# Patient Record
Sex: Male | Born: 1935 | Race: White | Hispanic: No | Marital: Married | Smoking: Former smoker
Health system: Southern US, Community
[De-identification: ages and names within clinical notes are randomized; demographics above are authoritative.]

## PROBLEM LIST (undated history)

## (undated) DIAGNOSIS — M353 Polymyalgia rheumatica: Secondary | ICD-10-CM

## (undated) DIAGNOSIS — I2510 Atherosclerotic heart disease of native coronary artery without angina pectoris: Secondary | ICD-10-CM

## (undated) DIAGNOSIS — J069 Acute upper respiratory infection, unspecified: Secondary | ICD-10-CM

## (undated) DIAGNOSIS — E538 Deficiency of other specified B group vitamins: Secondary | ICD-10-CM

## (undated) DIAGNOSIS — N189 Chronic kidney disease, unspecified: Secondary | ICD-10-CM

## (undated) DIAGNOSIS — M792 Neuralgia and neuritis, unspecified: Secondary | ICD-10-CM

## (undated) DIAGNOSIS — IMO0002 Reserved for concepts with insufficient information to code with codable children: Secondary | ICD-10-CM

## (undated) DIAGNOSIS — Z9181 History of falling: Secondary | ICD-10-CM

## (undated) DIAGNOSIS — R011 Cardiac murmur, unspecified: Secondary | ICD-10-CM

## (undated) DIAGNOSIS — I499 Cardiac arrhythmia, unspecified: Secondary | ICD-10-CM

## (undated) DIAGNOSIS — Z8673 Personal history of transient ischemic attack (TIA), and cerebral infarction without residual deficits: Secondary | ICD-10-CM

## (undated) DIAGNOSIS — R55 Syncope and collapse: Secondary | ICD-10-CM

## (undated) DIAGNOSIS — K219 Gastro-esophageal reflux disease without esophagitis: Secondary | ICD-10-CM

## (undated) DIAGNOSIS — D649 Anemia, unspecified: Secondary | ICD-10-CM

## (undated) DIAGNOSIS — I2089 Other forms of angina pectoris: Secondary | ICD-10-CM

## (undated) DIAGNOSIS — I6529 Occlusion and stenosis of unspecified carotid artery: Secondary | ICD-10-CM

## (undated) DIAGNOSIS — R0989 Other specified symptoms and signs involving the circulatory and respiratory systems: Secondary | ICD-10-CM

## (undated) DIAGNOSIS — I208 Other forms of angina pectoris: Secondary | ICD-10-CM

## (undated) DIAGNOSIS — M545 Low back pain, unspecified: Secondary | ICD-10-CM

## (undated) DIAGNOSIS — N400 Enlarged prostate without lower urinary tract symptoms: Secondary | ICD-10-CM

## (undated) DIAGNOSIS — J309 Allergic rhinitis, unspecified: Secondary | ICD-10-CM

## (undated) DIAGNOSIS — I509 Heart failure, unspecified: Secondary | ICD-10-CM

## (undated) DIAGNOSIS — Z8719 Personal history of other diseases of the digestive system: Secondary | ICD-10-CM

## (undated) DIAGNOSIS — R609 Edema, unspecified: Secondary | ICD-10-CM

## (undated) DIAGNOSIS — N200 Calculus of kidney: Secondary | ICD-10-CM

## (undated) DIAGNOSIS — I209 Angina pectoris, unspecified: Secondary | ICD-10-CM

## (undated) DIAGNOSIS — I10 Essential (primary) hypertension: Secondary | ICD-10-CM

## (undated) DIAGNOSIS — M1990 Unspecified osteoarthritis, unspecified site: Secondary | ICD-10-CM

## (undated) DIAGNOSIS — N529 Male erectile dysfunction, unspecified: Secondary | ICD-10-CM

## (undated) DIAGNOSIS — R51 Headache: Secondary | ICD-10-CM

## (undated) DIAGNOSIS — I7789 Other specified disorders of arteries and arterioles: Secondary | ICD-10-CM

## (undated) DIAGNOSIS — M549 Dorsalgia, unspecified: Secondary | ICD-10-CM

## (undated) DIAGNOSIS — I714 Abdominal aortic aneurysm, without rupture, unspecified: Secondary | ICD-10-CM

## (undated) DIAGNOSIS — R0602 Shortness of breath: Secondary | ICD-10-CM

## (undated) DIAGNOSIS — K277 Chronic peptic ulcer, site unspecified, without hemorrhage or perforation: Secondary | ICD-10-CM

## (undated) DIAGNOSIS — I639 Cerebral infarction, unspecified: Secondary | ICD-10-CM

## (undated) DIAGNOSIS — I447 Left bundle-branch block, unspecified: Secondary | ICD-10-CM

## (undated) DIAGNOSIS — I219 Acute myocardial infarction, unspecified: Secondary | ICD-10-CM

## (undated) DIAGNOSIS — G709 Myoneural disorder, unspecified: Secondary | ICD-10-CM

## (undated) DIAGNOSIS — H9190 Unspecified hearing loss, unspecified ear: Secondary | ICD-10-CM

## (undated) DIAGNOSIS — I259 Chronic ischemic heart disease, unspecified: Secondary | ICD-10-CM

## (undated) DIAGNOSIS — K649 Unspecified hemorrhoids: Secondary | ICD-10-CM

## (undated) DIAGNOSIS — R0600 Dyspnea, unspecified: Secondary | ICD-10-CM

## (undated) DIAGNOSIS — I252 Old myocardial infarction: Secondary | ICD-10-CM

## (undated) DIAGNOSIS — T782XXA Anaphylactic shock, unspecified, initial encounter: Secondary | ICD-10-CM

## (undated) DIAGNOSIS — G4730 Sleep apnea, unspecified: Secondary | ICD-10-CM

## (undated) DIAGNOSIS — J45909 Unspecified asthma, uncomplicated: Secondary | ICD-10-CM

## (undated) DIAGNOSIS — E1165 Type 2 diabetes mellitus with hyperglycemia: Secondary | ICD-10-CM

## (undated) DIAGNOSIS — L309 Dermatitis, unspecified: Secondary | ICD-10-CM

## (undated) DIAGNOSIS — L570 Actinic keratosis: Secondary | ICD-10-CM

## (undated) DIAGNOSIS — C801 Malignant (primary) neoplasm, unspecified: Secondary | ICD-10-CM

## (undated) DIAGNOSIS — E1149 Type 2 diabetes mellitus with other diabetic neurological complication: Secondary | ICD-10-CM

## (undated) DIAGNOSIS — E7800 Pure hypercholesterolemia, unspecified: Secondary | ICD-10-CM

## (undated) HISTORY — DX: Personal history of transient ischemic attack (TIA), and cerebral infarction without residual deficits: Z86.73

## (undated) HISTORY — DX: Low back pain: M54.5

## (undated) HISTORY — DX: Dorsalgia, unspecified: M54.9

## (undated) HISTORY — DX: Unspecified asthma, uncomplicated: J45.909

## (undated) HISTORY — DX: Other specified disorders of arteries and arterioles: I77.89

## (undated) HISTORY — DX: Polymyalgia rheumatica: M35.3

## (undated) HISTORY — DX: Left bundle-branch block, unspecified: I44.7

## (undated) HISTORY — DX: Dermatitis, unspecified: L30.9

## (undated) HISTORY — DX: Abdominal aortic aneurysm, without rupture, unspecified: I71.40

## (undated) HISTORY — DX: Edema, unspecified: R60.9

## (undated) HISTORY — DX: Unspecified hearing loss, unspecified ear: H91.90

## (undated) HISTORY — DX: Gastro-esophageal reflux disease without esophagitis: K21.9

## (undated) HISTORY — DX: Chronic ischemic heart disease, unspecified: I25.9

## (undated) HISTORY — DX: Abdominal aortic aneurysm, without rupture: I71.4

## (undated) HISTORY — DX: Syncope and collapse: R55

## (undated) HISTORY — PX: CARDIAC CATHETERIZATION: SHX172

## (undated) HISTORY — DX: Old myocardial infarction: I25.2

## (undated) HISTORY — PX: ROTATOR CUFF REPAIR: SHX139

## (undated) HISTORY — PX: EYE SURGERY: SHX253

## (undated) HISTORY — DX: Allergic rhinitis, unspecified: J30.9

## (undated) HISTORY — DX: Type 2 diabetes mellitus with hyperglycemia: E11.65

## (undated) HISTORY — DX: Unspecified osteoarthritis, unspecified site: M19.90

## (undated) HISTORY — DX: Chronic peptic ulcer, site unspecified, without hemorrhage or perforation: K27.7

## (undated) HISTORY — DX: Actinic keratosis: L57.0

## (undated) HISTORY — PX: INSERT / REPLACE / REMOVE PACEMAKER: SUR710

## (undated) HISTORY — DX: Unspecified hemorrhoids: K64.9

## (undated) HISTORY — DX: Reserved for concepts with insufficient information to code with codable children: IMO0002

## (undated) HISTORY — PX: HERNIA REPAIR: SHX51

## (undated) HISTORY — DX: History of falling: Z91.81

## (undated) HISTORY — DX: Male erectile dysfunction, unspecified: N52.9

## (undated) HISTORY — DX: Deficiency of other specified B group vitamins: E53.8

## (undated) HISTORY — DX: Neuralgia and neuritis, unspecified: M79.2

## (undated) HISTORY — DX: Type 2 diabetes mellitus with other diabetic neurological complication: E11.49

## (undated) HISTORY — PX: HEMORRHOID SURGERY: SHX153

## (undated) HISTORY — DX: Other forms of angina pectoris: I20.8

## (undated) HISTORY — DX: Other forms of angina pectoris: I20.89

## (undated) HISTORY — DX: Anemia, unspecified: D64.9

## (undated) HISTORY — DX: Low back pain, unspecified: M54.50

## (undated) HISTORY — DX: Occlusion and stenosis of unspecified carotid artery: I65.29

## (undated) HISTORY — DX: Pure hypercholesterolemia, unspecified: E78.00

## (undated) HISTORY — DX: Anaphylactic shock, unspecified, initial encounter: T78.2XXA

## (undated) HISTORY — DX: Benign prostatic hyperplasia without lower urinary tract symptoms: N40.0

## (undated) HISTORY — PX: APPENDECTOMY: SHX54

## (undated) HISTORY — DX: Dyspnea, unspecified: R06.00

## (undated) HISTORY — DX: Calculus of kidney: N20.0

## (undated) HISTORY — DX: Other specified symptoms and signs involving the circulatory and respiratory systems: R09.89

---

## 1998-02-04 ENCOUNTER — Emergency Department (HOSPITAL_COMMUNITY): Admission: EM | Admit: 1998-02-04 | Discharge: 1998-02-04 | Payer: Self-pay | Admitting: Emergency Medicine

## 1998-02-04 ENCOUNTER — Encounter: Payer: Self-pay | Admitting: Emergency Medicine

## 1998-03-15 HISTORY — PX: CARDIOVERSION: SHX1299

## 1998-03-24 ENCOUNTER — Inpatient Hospital Stay (HOSPITAL_COMMUNITY): Admission: EM | Admit: 1998-03-24 | Discharge: 1998-03-26 | Payer: Self-pay | Admitting: Emergency Medicine

## 2002-03-12 ENCOUNTER — Inpatient Hospital Stay (HOSPITAL_COMMUNITY): Admission: EM | Admit: 2002-03-12 | Discharge: 2002-03-18 | Payer: Self-pay | Admitting: Emergency Medicine

## 2002-03-12 ENCOUNTER — Encounter: Payer: Self-pay | Admitting: Emergency Medicine

## 2002-03-18 ENCOUNTER — Encounter: Payer: Self-pay | Admitting: Cardiology

## 2002-08-12 ENCOUNTER — Encounter: Payer: Self-pay | Admitting: Emergency Medicine

## 2002-08-13 ENCOUNTER — Inpatient Hospital Stay (HOSPITAL_COMMUNITY): Admission: EM | Admit: 2002-08-13 | Discharge: 2002-08-15 | Payer: Self-pay | Admitting: Emergency Medicine

## 2003-03-12 ENCOUNTER — Encounter: Payer: Self-pay | Admitting: Cardiology

## 2003-03-12 ENCOUNTER — Ambulatory Visit (HOSPITAL_COMMUNITY): Admission: RE | Admit: 2003-03-12 | Discharge: 2003-03-12 | Payer: Self-pay | Admitting: Cardiology

## 2003-07-25 ENCOUNTER — Ambulatory Visit (HOSPITAL_COMMUNITY): Admission: RE | Admit: 2003-07-25 | Discharge: 2003-07-25 | Payer: Self-pay | Admitting: *Deleted

## 2003-07-25 ENCOUNTER — Encounter (INDEPENDENT_AMBULATORY_CARE_PROVIDER_SITE_OTHER): Payer: Self-pay | Admitting: *Deleted

## 2003-11-15 ENCOUNTER — Encounter: Payer: Self-pay | Admitting: Internal Medicine

## 2004-04-07 ENCOUNTER — Emergency Department (HOSPITAL_COMMUNITY): Admission: EM | Admit: 2004-04-07 | Discharge: 2004-04-08 | Payer: Self-pay

## 2004-04-09 ENCOUNTER — Ambulatory Visit (HOSPITAL_COMMUNITY): Admission: RE | Admit: 2004-04-09 | Discharge: 2004-04-09 | Payer: Self-pay | Admitting: Gastroenterology

## 2004-07-03 ENCOUNTER — Ambulatory Visit: Payer: Self-pay | Admitting: Gastroenterology

## 2006-07-30 ENCOUNTER — Emergency Department (HOSPITAL_COMMUNITY): Admission: EM | Admit: 2006-07-30 | Discharge: 2006-07-30 | Payer: Self-pay | Admitting: Emergency Medicine

## 2006-09-06 ENCOUNTER — Emergency Department (HOSPITAL_COMMUNITY): Admission: EM | Admit: 2006-09-06 | Discharge: 2006-09-06 | Payer: Self-pay | Admitting: Emergency Medicine

## 2007-06-16 HISTORY — PX: DOPPLER ECHOCARDIOGRAPHY: SHX263

## 2007-07-19 ENCOUNTER — Ambulatory Visit (HOSPITAL_COMMUNITY): Admission: RE | Admit: 2007-07-19 | Discharge: 2007-07-19 | Payer: Self-pay | Admitting: Cardiology

## 2007-07-19 ENCOUNTER — Encounter: Payer: Self-pay | Admitting: Internal Medicine

## 2007-08-04 ENCOUNTER — Encounter: Payer: Self-pay | Admitting: Internal Medicine

## 2007-11-16 ENCOUNTER — Encounter (INDEPENDENT_AMBULATORY_CARE_PROVIDER_SITE_OTHER): Payer: Self-pay | Admitting: *Deleted

## 2007-11-16 ENCOUNTER — Ambulatory Visit (HOSPITAL_COMMUNITY): Admission: RE | Admit: 2007-11-16 | Discharge: 2007-11-16 | Payer: Self-pay | Admitting: *Deleted

## 2008-01-30 ENCOUNTER — Encounter: Admission: RE | Admit: 2008-01-30 | Discharge: 2008-01-30 | Payer: Self-pay | Admitting: Specialist

## 2008-05-13 ENCOUNTER — Ambulatory Visit: Payer: Self-pay | Admitting: Internal Medicine

## 2008-05-13 ENCOUNTER — Inpatient Hospital Stay (HOSPITAL_COMMUNITY): Admission: EM | Admit: 2008-05-13 | Discharge: 2008-05-16 | Payer: Self-pay | Admitting: Emergency Medicine

## 2008-05-15 ENCOUNTER — Encounter (INDEPENDENT_AMBULATORY_CARE_PROVIDER_SITE_OTHER): Payer: Self-pay | Admitting: Internal Medicine

## 2008-06-14 ENCOUNTER — Emergency Department (HOSPITAL_COMMUNITY): Admission: EM | Admit: 2008-06-14 | Discharge: 2008-06-14 | Payer: Self-pay | Admitting: Emergency Medicine

## 2008-11-15 ENCOUNTER — Encounter: Payer: Self-pay | Admitting: Internal Medicine

## 2008-11-30 ENCOUNTER — Observation Stay (HOSPITAL_COMMUNITY): Admission: EM | Admit: 2008-11-30 | Discharge: 2008-12-01 | Payer: Self-pay | Admitting: Internal Medicine

## 2009-05-14 ENCOUNTER — Encounter: Payer: Self-pay | Admitting: Internal Medicine

## 2009-07-24 ENCOUNTER — Encounter: Admission: RE | Admit: 2009-07-24 | Discharge: 2009-07-24 | Payer: Self-pay | Admitting: Neurology

## 2009-08-05 ENCOUNTER — Encounter: Payer: Self-pay | Admitting: Internal Medicine

## 2009-08-05 ENCOUNTER — Encounter: Admission: RE | Admit: 2009-08-05 | Discharge: 2009-08-05 | Payer: Self-pay | Admitting: Internal Medicine

## 2009-08-14 ENCOUNTER — Ambulatory Visit (HOSPITAL_COMMUNITY): Admission: RE | Admit: 2009-08-14 | Discharge: 2009-08-14 | Payer: Self-pay | Admitting: Orthopaedic Surgery

## 2009-09-12 ENCOUNTER — Encounter: Payer: Self-pay | Admitting: Internal Medicine

## 2009-10-23 ENCOUNTER — Encounter: Payer: Self-pay | Admitting: Internal Medicine

## 2009-12-13 DIAGNOSIS — I4891 Unspecified atrial fibrillation: Secondary | ICD-10-CM

## 2009-12-13 DIAGNOSIS — R55 Syncope and collapse: Secondary | ICD-10-CM

## 2009-12-13 DIAGNOSIS — I495 Sick sinus syndrome: Secondary | ICD-10-CM | POA: Insufficient documentation

## 2009-12-13 DIAGNOSIS — Z87898 Personal history of other specified conditions: Secondary | ICD-10-CM

## 2009-12-13 DIAGNOSIS — E785 Hyperlipidemia, unspecified: Secondary | ICD-10-CM

## 2009-12-17 ENCOUNTER — Ambulatory Visit: Payer: Self-pay | Admitting: Internal Medicine

## 2009-12-17 DIAGNOSIS — I5032 Chronic diastolic (congestive) heart failure: Secondary | ICD-10-CM | POA: Insufficient documentation

## 2009-12-17 DIAGNOSIS — Z950 Presence of cardiac pacemaker: Secondary | ICD-10-CM | POA: Insufficient documentation

## 2009-12-19 ENCOUNTER — Telehealth: Payer: Self-pay | Admitting: Internal Medicine

## 2010-03-26 ENCOUNTER — Encounter: Payer: Self-pay | Admitting: Internal Medicine

## 2010-04-03 ENCOUNTER — Encounter: Admission: RE | Admit: 2010-04-03 | Discharge: 2010-04-03 | Payer: Self-pay | Admitting: Internal Medicine

## 2010-06-20 ENCOUNTER — Encounter: Payer: Self-pay | Admitting: Internal Medicine

## 2010-06-20 ENCOUNTER — Ambulatory Visit
Admission: RE | Admit: 2010-06-20 | Discharge: 2010-06-20 | Payer: Self-pay | Source: Home / Self Care | Attending: Internal Medicine | Admitting: Internal Medicine

## 2010-07-17 NOTE — Cardiovascular Report (Signed)
Summary: [facility] Cath Report   [facility] Cath Report   Imported By: Roderic Ovens 01/20/2010 14:32:18  _____________________________________________________________________  External Attachment:    Type:   Image     Comment:   External Document

## 2010-07-17 NOTE — Letter (Signed)
Summary: [facility] Office Visit   [facility] Office Visit   Imported By: Roderic Ovens 06/03/2010 16:24:56  _____________________________________________________________________  External Attachment:    Type:   Image     Comment:   External Document

## 2010-07-17 NOTE — Cardiovascular Report (Signed)
Summary: Office Visit   Office Visit   Imported By: Roderic Ovens 07/08/2010 13:39:05  _____________________________________________________________________  External Attachment:    Type:   Image     Comment:   External Document

## 2010-07-17 NOTE — Cardiovascular Report (Signed)
Summary: Office Visit   Office Visit   Imported By: Roderic Ovens 12/24/2009 09:57:26  _____________________________________________________________________  External Attachment:    Type:   Image     Comment:   External Document

## 2010-07-17 NOTE — Progress Notes (Signed)
Summary: [facility] Office Visit Note  [facility] Office Visit Note   Imported By: Roderic Ovens 01/20/2010 14:27:48  _____________________________________________________________________  External Attachment:    Type:   Image     Comment:   External Document

## 2010-07-17 NOTE — Letter (Signed)
Summary: [facility] Office Visit Note  [facility] Office Visit Note   Imported By: Roderic Ovens 01/20/2010 14:28:16  _____________________________________________________________________  External Attachment:    Type:   Image     Comment:   External Document

## 2010-07-17 NOTE — Progress Notes (Signed)
Summary: [facility] Office Visit Note  [facility] Office Visit Note   Imported By: Roderic Ovens 01/20/2010 14:30:21  _____________________________________________________________________  External Attachment:    Type:   Image     Comment:   External Document

## 2010-07-17 NOTE — Letter (Signed)
Summary: Sleep Study, Echo, Ekg, Myocardial Perfusion 2005 - 2007  Sleep Study, Echo, Ekg, Myocardial Perfusion 2005 - 2007   Imported By: Roderic Ovens 01/20/2010 14:33:26  _____________________________________________________________________  External Attachment:    Type:   Image     Comment:   External Document

## 2010-07-17 NOTE — Progress Notes (Signed)
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  The pt said his head also felt funny.  Pt denies CP or syncope.  The pt has had spells like this before but not as bad. The pt sat down and his symptoms passed.  The pt feels fine now.  The pt is a diabetic and his glucose this morning was 176 and he said that was high for him in the morning.  The pt did take his medications.  I told the pt that these symptoms could be related to his blood sugar.  The pt will call our office if he has any other questions or concerns.  I advised the pt to check his glucose if this happens again and remain well hydrated when outside.  Pt agreed with plan.    Follow-up by: Julieta Gutting, RN, BSN,  December 19, 2009 9:06 AM

## 2010-07-17 NOTE — Assessment & Plan Note (Signed)
Summary: nep/pacer/jss   Visit Type:  Initial Consult Primary Provider:  Merri Brunette   History of Present Illness: Paul Dalton is referred today by Dr. Renne Crigler for ongoing evaluation and treatment of atrial fibrillation, s/p PPM, bradycardia s/p PPM, CAD and HTN.  The patient has had longstanding atrial fib with tachybrady syndrome for many yrs.  He has undergone initial PPM and subsequent PM generator change.  He is now programmed VVIR as his atrial fib is thought to be chronic.  He has class 1-2 CHF symptoms, and has chronic atrial fibrillation.  He denies syncope or near syncope.  Current Medications (verified): 1)  Janumet 50-500 Mg Tabs (Sitagliptin-Metformin Hcl) .... Two Times A Day 2)  Omeprazole 20 Mg Cpdr (Omeprazole) .... Bid 3)  Digoxin 0.25 Mg Tabs (Digoxin) .... Take One Tablet By Mouth Daily 4)  Lisinopril 40 Mg Tabs (Lisinopril) .... Take One Tablet By Mouth Daily 5)  Diltiazem Hcl Er Beads 360 Mg Xr24h-Cap (Diltiazem Hcl Er Beads) .... Take One Capsule By Mouth Daily 6)  Simvastatin 40 Mg Tabs (Simvastatin) .... Take One Tablet By Mouth Daily At Bedtime 7)  Metoprolol Tartrate 100 Mg Tabs (Metoprolol Tartrate) .... Take One Tablet By Mouth Twice A Day 8)  Warfarin Sodium 5 Mg Tabs (Warfarin Sodium) .... Use As Directed By Anticoagulation Clinic 9)  Doxazosin Mesylate 4 Mg Tabs (Doxazosin Mesylate) .... Once Daily 10)  Nitrostat 0.4 Mg Subl (Nitroglycerin) .Marland Kitchen.. 1 Tablet Under Tongue At Onset of Chest Pain; You May Repeat Every 5 Minutes For Up To 3 Doses. 11)  Actos 30 Mg Tabs (Pioglitazone Hcl) .... Once Daily 12)  Finasteride 5 Mg Tabs (Finasteride) .Marland Kitchen.. 1 By Mouth Daily 13)  Lantus Solostar 100 Unit/ml  Soln (Insulin Glargine) .... Use As Directed 14)  Novolog 100 Unit/ml Soln (Insulin Aspart) .... Uad 15)  Hydrocodone-Acetaminophen 5-500 Mg Tabs (Hydrocodone-Acetaminophen) .... Uad 16)  Vitamin D 400 Unit Tabs (Cholecalciferol) .... Two Times A Day 17)  Folic Acid 400  Mcg Tabs (Folic Acid) .... Once Daily 18)  Vitamin B-12 1000 Mcg Subl (Cyanocobalamin) .... Once Daily 19)  Prednisone 5 Mg Tabs (Prednisone) .... Once Daily 20)  Glucosamine-Chondroitin   Caps (Glucosamine-Chondroit-Vit C-Mn) .... 650mg  Two Times A Day 21)  Cinnamon 500 Mg Tabs (Cinnamon) .... Two Times A Day  Allergies (verified): 1)  ! Imdur 2)  ! Jesusita Oka  Past History:  Past Medical History: Last updated: 12/13/2009 Current Problems:  ATRIAL FIBRILLATION (ICD-427.31) HYPERLIPIDEMIA (ICD-272.4) BENIGN PROSTATIC HYPERTROPHY, HX OF (ICD-V13.8) BRADYCARDIA-TACHYCARDIA SYNDROME (ICD-427.81) SYNCOPE (ICD-780.2)  Past Surgical History: Last updated: 12/13/2009  1. Pacemaker placement.   2. Appendectomy.   Social History: Last updated: 12/13/2009  The patient lives with his wife.  Denies smoking   cigarettes, drinking alcohol, or using illegal drugs.   Family History: Non-contributory at his advanced age.  Review of Systems       All systems reviewed and negative except as noted in the HPI.  Vital Signs:  Patient profile:   77 year old male Height:      77 inches Weight:      248 pounds BMI:     29.51 Pulse rate:   75 / minute BP sitting:   160 / 90  (left arm)  Vitals Entered By: Laurance Flatten CMA (December 17, 2009 3:35 PM)  Physical Exam  General:  elderly, well developed, well nourished, in no acute distress.  HEENT: normal Neck: supple. No JVD. Carotids 2+ bilaterally no bruits Cor:  RRR no rubs or gallops.  A soft systolic murmur is present at the left upper sternal border.l Lungs: CTA. Well healed PPM incision. Ab: soft, nontender. nondistended. No HSM. Good bowel sounds Ext: warm. no cyanosis or clubbing 2+ edema bilaterally with support stockings being worn. Neuro: alert and oriented. Grossly nonfocal. affect pleasant    PPM Specifications Following MD:  Lewayne Bunting, MD     PPM Vendor:  St Jude     PPM Model Number:  [id number]     PPM Serial Number:   [id number] PPM DOI:  07/19/2007     PPM Implanting MD:  NOT IMPLANTED BY [place]  Lead 1    Location: RA     DOI: 03/17/2002     Model #: 1342T     Serial #: [id number]     Status: capped Lead 2    Location: RV     DOI: 03/17/2002     Model #: 1336T     Serial #: [id number]     Status: A`  Magnet Response Rate:  BOL 98.6 ERI  86.3  Indications:  A-fib Brady  Explantation Comments:  Agree with above.  PPM Follow Up Remote Check?  No Battery Voltage:  2.79 V     Battery Est. Longevity:  4.25 years     Pacer Dependent:  Yes     Right Ventricle  Impedance: 566 ohms, Threshold: 0.5 V at 0.5 msec  Episodes Coumadin:  Yes Ventricular High Rate:  1     Ventricular Pacing:  100%  Parameters Mode:  VVIR     Lower Rate Limit:  75     Upper Rate Limit:  120 Next Cardiology Appt Due:  06/15/2010 Tech Comments:  1 VHR episode 5 seconds.  Checked by [employer].  No parameter changes.  ROV 6 months with Dr. Ladona Ridgel. Altha Harm, LPN  December 18, 2534 4:24 PM  MD Comments:  Agree with above.  Impression & Recommendations:  Problem # 1:  CARDIAC PACEMAKER IN SITU (ICD-V45.01) His device is working normally.  Will recheck in several months.  Problem # 2:  ATRIAL FIBRILLATION (ICD-427.31) His rate is well controlled.  He will continue meds as below. His updated medication list for this problem includes:    Digoxin 0.25 Mg Tabs (Digoxin) .Marland Kitchen... Take one tablet by mouth daily    Metoprolol Tartrate 100 Mg Tabs (Metoprolol tartrate) .Marland Kitchen... Take one tablet by mouth twice a day    Warfarin Sodium 5 Mg Tabs (Warfarin sodium) ..... Use as directed by anticoagulation clinic  Problem # 3:  BRADYCARDIA-TACHYCARDIA SYNDROME (ICD-427.81) He appears to be well controlled and is asymptomatic. His updated medication list for this problem includes:    Lisinopril 40 Mg Tabs (Lisinopril) .Marland Kitchen... Take one tablet by mouth daily    Diltiazem Hcl Er Beads 360 Mg Xr24h-cap (Diltiazem hcl er beads) .Marland Kitchen... Take one capsule by mouth daily     Metoprolol Tartrate 100 Mg Tabs (Metoprolol tartrate) .Marland Kitchen... Take one tablet by mouth twice a day    Warfarin Sodium 5 Mg Tabs (Warfarin sodium) ..... Use as directed by anticoagulation clinic    Nitrostat 0.4 Mg Subl (Nitroglycerin) .Marland Kitchen... 1 tablet under tongue at onset of chest pain; you may repeat every 5 minutes for up to 3 doses.  Problem # 4:  CHRONIC DIASTOLIC HEART FAILURE (ICD-428.32) He has mild peripheral edema and class 2 symptoms with regard to dyspnea.  A low sodium diet and continued medical therapy have been recommended. His  updated medication list for this problem includes:    Digoxin 0.25 Mg Tabs (Digoxin) .Marland Kitchen... Take one tablet by mouth daily    Lisinopril 40 Mg Tabs (Lisinopril) .Marland Kitchen... Take one tablet by mouth daily    Diltiazem Hcl Er Beads 360 Mg Xr24h-cap (Diltiazem hcl er beads) .Marland Kitchen... Take one capsule by mouth daily    Metoprolol Tartrate 100 Mg Tabs (Metoprolol tartrate) .Marland Kitchen... Take one tablet by mouth twice a day    Warfarin Sodium 5 Mg Tabs (Warfarin sodium) ..... Use as directed by anticoagulation clinic    Nitrostat 0.4 Mg Subl (Nitroglycerin) .Marland Kitchen... 1 tablet under tongue at onset of chest pain; you may repeat every 5 minutes for up to 3 doses.  Patient Instructions: 1)  Your physician wants you to follow-up in: 6 months  You will receive a reminder letter in the mail two months in advance. If you don't receive a letter, please call our office to schedule the follow-up appointment. Prescriptions: LANTUS SOLOSTAR 100 UNIT/ML  SOLN (INSULIN GLARGINE) use as directed  #3 boxes x 3   Entered by:   Laurance Flatten CMA   Authorized by:   Laren Boom, MD, [facility]   Signed by:   Laren Boom, MD, [facility] on 12/17/2009   Method used:   Historical   RxID:   [id number]

## 2010-07-17 NOTE — Assessment & Plan Note (Signed)
Summary: per check out   Visit Type:  st.jude Primary Provider:  Merri Brunette  CC:  shortness of breath and swelling in feet and legs.  History of Present Illness: Mr. Edgett returns today for PPM followup. He is a patient of Dr. Carolee Rota who was referred for ongoing evaluation and treatment of atrial fibrillation, s/p PPM, bradycardia s/p PPM, CAD and HTN.  The patient has had longstanding atrial fib with tachybrady syndrome for many yrs.  He has undergone initial PPM and subsequent PM generator change.  He is now programmed VVIR as his atrial fib is thought to be chronic.  He has class 1-2 CHF symptoms, and has chronic atrial fibrillation.  He denies syncope or near syncope. Since I saw him last he has remained active. No syncope.  Current Medications (verified): 1)  Janumet 50-500 Mg Tabs (Sitagliptin-Metformin Hcl) .... Two Times A Day 2)  Omeprazole 20 Mg Cpdr (Omeprazole) .... Bid 3)  Digoxin 0.25 Mg Tabs (Digoxin) .... Take One Tablet By Mouth Daily 4)  Lisinopril 40 Mg Tabs (Lisinopril) .... Take One Tablet By Mouth Daily 5)  Diltiazem Hcl Er Beads 360 Mg Xr24h-Cap (Diltiazem Hcl Er Beads) .... Take One Capsule By Mouth Daily 6)  Simvastatin 10 Mg Tabs (Simvastatin) .... Take One Tablet By Mouth Daily At Bedtime 7)  Metoprolol Tartrate 100 Mg Tabs (Metoprolol Tartrate) .... Take One Tablet By Mouth Twice A Day 8)  Warfarin Sodium 5 Mg Tabs (Warfarin Sodium) .... Use As Directed By Anticoagulation Clinic 9)  Doxazosin Mesylate 4 Mg Tabs (Doxazosin Mesylate) .... Once Daily 10)  Nitrostat 0.4 Mg Subl (Nitroglycerin) .Marland Kitchen.. 1 Tablet Under Tongue At Onset of Chest Pain; You May Repeat Every 5 Minutes For Up To 3 Doses. 11)  Actos 30 Mg Tabs (Pioglitazone Hcl) .... Once Daily 12)  Finasteride 5 Mg Tabs (Finasteride) .Marland Kitchen.. 1 By Mouth Daily 13)  Lantus Solostar 100 Unit/ml  Soln (Insulin Glargine) .... Use As Directed 14)  Novolog 100 Unit/ml Soln (Insulin Aspart) .... Uad 15)   Hydrocodone-Acetaminophen 5-500 Mg Tabs (Hydrocodone-Acetaminophen) .... Uad 16)  Vitamin D 400 Unit Tabs (Cholecalciferol) .... Two Times A Day 17)  Folic Acid 400 Mcg Tabs (Folic Acid) .... Once Daily 18)  Vitamin B-12 1000 Mcg Subl (Cyanocobalamin) .... Once Daily 19)  Prednisone 5 Mg Tabs (Prednisone) .... Once Daily 20)  Glucosamine-Chondroitin   Caps (Glucosamine-Chondroit-Vit C-Mn) .... 650mg  Two Times A Day 21)  Cinnamon 500 Mg Tabs (Cinnamon) .... Two Times A Day 22)  Apple Cider Vinegar 500 Mg Tabs (Apple Cider Vinegar) .... Once Daily  Allergies (verified): 1)  ! Imdur 2)  ! Jesusita Oka  Past History:  Past Medical History: Last updated: 12/13/2009 Current Problems:  ATRIAL FIBRILLATION (ICD-427.31) HYPERLIPIDEMIA (ICD-272.4) BENIGN PROSTATIC HYPERTROPHY, HX OF (ICD-V13.8) BRADYCARDIA-TACHYCARDIA SYNDROME (ICD-427.81) SYNCOPE (ICD-780.2)  Past Surgical History: Last updated: 12/13/2009  1. Pacemaker placement.   2. Appendectomy.   Vital Signs:  Patient profile:   77 year old male Height:      77 inches Weight:      247 pounds BMI:     29.40 Pulse rate:   76 / minute BP sitting:   140 / 82  (left arm) Cuff size:   regular  Vitals Entered By: Caralee Ates CMA (June 20, 2010 12:55 PM)  Physical Exam  General:  elderly, well developed, well nourished, in no acute distress.  HEENT: normal Neck: supple. No JVD. Carotids 2+ bilaterally no bruits Cor: RRR no rubs or  gallops.  A soft systolic murmur is present at the left upper sternal border.l Lungs: CTA. Well healed PPM incision. Ab: soft, nontender. nondistended. No HSM. Good bowel sounds Ext: warm. no cyanosis or clubbing 2+ edema bilaterally with support stockings being worn. Neuro: alert and oriented. Grossly nonfocal. affect pleasant    PPM Specifications Following MD:  Lewayne Bunting, MD     PPM Vendor:  [employer] Jude     PPM Model Number:  [id number]     PPM Serial Number:  [id number] PPM DOI:  07/19/2007     PPM  Implanting MD:  NOT IMPLANTED BY [place]  Lead 1    Location: RA     DOI: 03/17/2002     Model #: 1342T     Serial #: [id number]     Status: capped Lead 2    Location: RV     DOI: 03/17/2002     Model #: 1336T     Serial #: [id number]     Status: A`  Magnet Response Rate:  BOL 98.6 ERI  86.3  Indications:  A-fib Brady  Explantation Comments:  Agree with above.  PPM Follow Up Battery Voltage:  2.79 V     Battery Est. Longevity:  4.50-5.75 yrs     Pacer Dependent:  Yes     Right Ventricle  Amplitude: 12.0 mV, Impedance: 608 ohms, Threshold: 0.625 V at 0.5 msec  Episodes MS Episodes:  0     Coumadin:  Yes Ventricular High Rate:  0     Ventricular Pacing:  >99%  Parameters Mode:  VVIR     Lower Rate Limit:  75     Upper Rate Limit:  120 Next Cardiology Appt Due:  12/15/2010 Tech Comments:  NORMAL DEVICE FUNCTION.  NO EPISODES SINCE LAST CHECK. CHANGED RV OUTPUT FROM 0.750 TO 0.875 DUE TO AUTOCAPTURE. ROV IN 6 MTHS W/DEVICE CLINIC. Vella Kohler  June 20, 2010 1:00 PM MD Comments:  Agree with above.  Impression & Recommendations:  Problem # 1:  CARDIAC PACEMAKER IN SITU (ICD-V45.01) His device is working normally.  Will followup in several months.  Problem # 2:  CHRONIC DIASTOLIC HEART FAILURE (ICD-428.32) His symptoms are class 1-2. He will continue his current meds. His updated medication list for this problem includes:    Digoxin 0.25 Mg Tabs (Digoxin) .Marland Kitchen... Take one tablet by mouth daily    Lisinopril 40 Mg Tabs (Lisinopril) .Marland Kitchen... Take one tablet by mouth daily    Diltiazem Hcl Er Beads 360 Mg Xr24h-cap (Diltiazem hcl er beads) .Marland Kitchen... Take one capsule by mouth daily    Metoprolol Tartrate 100 Mg Tabs (Metoprolol tartrate) .Marland Kitchen... Take one tablet by mouth twice a day    Warfarin Sodium 5 Mg Tabs (Warfarin sodium) ..... Use as directed by anticoagulation clinic    Nitrostat 0.4 Mg Subl (Nitroglycerin) .Marland Kitchen... 1 tablet under tongue at onset of chest pain; you may repeat every 5 minutes for  up to 3 doses.  Problem # 3:  ATRIAL FIBRILLATION (ICD-427.31) His ventricular rate is well controlled. Continue meds as below. His updated medication list for this problem includes:    Digoxin 0.25 Mg Tabs (Digoxin) .Marland Kitchen... Take one tablet by mouth daily    Metoprolol Tartrate 100 Mg Tabs (Metoprolol tartrate) .Marland Kitchen... Take one tablet by mouth twice a day    Warfarin Sodium 5 Mg Tabs (Warfarin sodium) ..... Use as directed by anticoagulation clinic  Patient Instructions: 1)  Your physician recommends that you continue on your  current medications as directed. Please refer to the Current Medication list given to you today. 2)  Your physician wants you to follow-up in: 1 year with Dr. Ladona Ridgel.  You will receive a reminder letter in the mail two months in advance. If you don't receive a letter, please call our office to schedule the follow-up appointment.

## 2010-08-29 ENCOUNTER — Telehealth: Payer: Self-pay | Admitting: Internal Medicine

## 2010-09-07 LAB — PROTIME-INR: INR: 1.17 (ref 0.00–1.49)

## 2010-09-07 LAB — BASIC METABOLIC PANEL
BUN: 18 mg/dL (ref 6–23)
CO2: 28 mEq/L (ref 19–32)
Calcium: 9.1 mg/dL (ref 8.4–10.5)
Chloride: 96 mEq/L (ref 96–112)
GFR calc Af Amer: >60 mL/min (ref >60)
GFR calc non Af Amer: >60 mL/min (ref >60)
Potassium: 4 mEq/L (ref 3.5–5.1)

## 2010-09-07 LAB — CBC
MCHC: 34.2 g/dL (ref 30.0–36.0)
MCV: 96.9 fL (ref 78.0–100.0)
Platelets: 162 10*3/uL (ref 150–400)
RBC: 3.94 MIL/uL — ABNORMAL LOW (ref 4.22–5.81)
WBC: 9.5 10*3/uL (ref 4.0–10.5)

## 2010-09-07 LAB — GLUCOSE, CAPILLARY
Glucose-Capillary: 173 mg/dL — ABNORMAL HIGH (ref 70–99)
Glucose-Capillary: 180 mg/dL — ABNORMAL HIGH (ref 70–99)

## 2010-09-07 LAB — APTT: aPTT: 34 seconds (ref 24–37)

## 2010-09-11 NOTE — Progress Notes (Signed)
Summary: pt needs to come off coumadin for 5days-lvmtcb*  Phone Note Call from Patient Call back at Home Phone [phone]   Caller: Patient Reason for Call: Talk to Nurse, Talk to Doctor Summary of Call: pt is having back problems and needs to come off coumadin for 5days to get a back injection Initial call taken by: Omer Jack,  August 29, 2010 4:44 PM  Follow-up for Phone Call        phone busy Deliah Goody, RN  August 29, 2010 4:49 PM  lvmtcb* Whitney Maeola Sarah RN  September 01, 2010 8:26 AM  Follow-up by: Ellender Hose RN,  September 01, 2010 8:27 AM  Additional Follow-up for Phone Call Additional follow up Details #1::        pt have an epidural injestion on 09/12/10 with Dr Ethelene Hal at [facility].  Last dose on Sun 09/07/10 and restart the night of procedure. Discussed with Dr Ladona Ridgel will need to have a Lovenox bridge.  Pt aware and will discuss with Virgina Evener, Pharm D at Dr Carolee Rota office Dennis Bast, RN, BSN  September 01, 2010 6:13 PM

## 2010-09-22 LAB — POCT CARDIAC MARKERS
CKMB, poc: 1.2 ng/mL (ref 1.0–8.0)
Myoglobin, poc: 65.1 ng/mL (ref 12–200)
Troponin i, poc: <0.05 ng/mL (ref 0.00–0.09)
Troponin i, poc: <0.05 ng/mL (ref 0.00–0.09)
Troponin i, poc: <0.05 ng/mL (ref 0.00–0.09)

## 2010-09-22 LAB — DIFFERENTIAL
Basophils Relative: 0 % (ref 0–1)
Lymphs Abs: 1.9 10*3/uL (ref 0.7–4.0)
Neutrophils Relative %: 62 % (ref 43–77)

## 2010-09-22 LAB — CBC
MCHC: 33.5 g/dL (ref 30.0–36.0)
MCV: 95.9 fL (ref 78.0–100.0)
RBC: 4.04 MIL/uL — ABNORMAL LOW (ref 4.22–5.81)
WBC: 6.1 10*3/uL (ref 4.0–10.5)

## 2010-09-22 LAB — BASIC METABOLIC PANEL
BUN: 17 mg/dL (ref 6–23)
CO2: 24 mEq/L (ref 19–32)
Calcium: 9 mg/dL (ref 8.4–10.5)
GFR calc Af Amer: >60 mL/min (ref >60)
GFR calc non Af Amer: >60 mL/min (ref >60)
Glucose, Bld: 216 mg/dL — ABNORMAL HIGH (ref 70–99)
Potassium: 3.5 mEq/L (ref 3.5–5.1)

## 2010-09-22 LAB — APTT: aPTT: 34 seconds (ref 24–37)

## 2010-09-22 LAB — PROTIME-INR: INR: 1.9 — ABNORMAL HIGH (ref 0.00–1.49)

## 2010-10-28 NOTE — Cardiovascular Report (Signed)
Paul Dalton, SAMES NO.:  [id number]   MEDICAL RECORD NO.:  [id number]          PATIENT TYPE:  OIB   LOCATION:  2899                         FACILITY:  [facility]   PHYSICIAN:  Antionette Char, MD    DATE OF BIRTH:  [birth year]   DATE OF PROCEDURE:  07/19/2007  DATE OF DISCHARGE:                            CARDIAC CATHETERIZATION   PROCEDURE:  Pacemaker pulse generator replacement.   INDICATION FOR PROCEDURE:  A 77 year old male with a history of  pacemaker insertion in October 2003.  In 2006, he went into atrial  fibrillation and was unable to be cardioverted.  He has been in atrial  fibrillation and on Coumadin since that time.  His pacemaker battery  achieved replacement interval indications and therefore he was scheduled  for replacement of the pulse generator today.  With chronic atrial  fibrillation, we elected to change his dual-chamber pacemaker for a  single-chamber pacemaker, VVI mode.   PROCEDURE:  After signing an informed consent, the patient was  premedicated with 5 mg of Valium by mouth and brought to the cardiac  catheterization lab at [facility].  His right anterior chest  and base of neck were prepped and draped in a sterile fashion, and the  right transverse subclavicular plane was anesthetized locally overlying  the existing pulse generator.  An incision was made in this anesthetized  plane with the incision being deepened into the fibrous layer overlying  the pulse generator.  This fibrous layer was then incised, exposing the  pulse generator, which was removed from the pocket.  After removing the  pulse generator from the 2 pacing electrodes, we then analyzed both  leads, finding atrial fibrillation on the atrial lead, and therefore it  was capped with the cap being sutured with 2-0 silk suture.  We then  analyzed the ventricular lead, finding very good chronic thresholds with  a minimal voltage threshold of 0.7 volts, utilizing 1.0 mA  of current.  The R wave sensitivity measured 14.4 mV and the resistance was measured  at 641 ohms.  After obtaining these very good chronic pacing thresholds,  we then selected a new pulse generator made by [employer], model  Zephyr, model number [id number], serial number [id number].  After properly  analyzing the pulse generator, it was attached to the ventricular lead  in the usual fashion.  We then lavaged the wound profusely with a  Kanamycin solution.  The pulse generator was then placed within the  existing pocket, and the wound was closed in layers using 2-0 Vicryl.  Final skin closure was obtained with a cutaneous layer of Steri-Strips.  A sterile bulky dressing was applied to the wound.  At the end of the  procedure the pacemaker was noted to be functioning normally in the VVI  mode, and he was then returned to the short-stay unit until he receives  his next dose of Ancef.  He will then be discharged home with followup  in the office in 1 week to check his pacemaker and wound.      Antionette Char, MD  Electronically Signed     JRT/MEDQ  D:  07/19/2007  T:  07/19/2007  Job:  [id number]   cc:   Cath Lab

## 2010-10-28 NOTE — Discharge Summary (Signed)
Paul Dalton, ALESSIO NO.:  [id number]   MEDICAL RECORD NO.:  [id number]          PATIENT TYPE:  INP   LOCATION:  4710                         FACILITY:  [facility]   PHYSICIAN:  Lucita Ferrara, MD         DATE OF BIRTH:  1930-03-08   DATE OF ADMISSION:  05/13/2008  DATE OF DISCHARGE:  05/16/2008                               DISCHARGE SUMMARY   DISCHARGE DIAGNOSES:  1. Syncopal episode.  2. Neurocardiogenic syncope.  3. Positive tilt-table test.  4. History of tachybrady syndrome status post pacer.  5. Diabetes type 2.  6. Benign prostatic hypertrophy.  7. Hyperlipidemia.  8. Paroxysmal and chronic atrial fibrillation.   DISCHARGE CONDITION:  Stable.   CONSULTATIONS:  1. Dr. Hillis Range.  2. Dr. Aleen Campi from cardiology.   STUDIES:  Patient had a tilt-table test dated May 15, 2008, by Dr.  Graciela Husbands, tilt-table test for orthostatic hypotension.  Per cardiology,  tilt-table test positive.  I suspect neurocardiogenic syncope.   Changes in medications as follows:  1. The patient will be off diltiazem and Demadex.  The patient will      remain on all other medications.  2. Metformin 1000 mg p.o. b.i.d.  3. Omeprazole 20 mg p.o. b.i.d.  4. Digoxin 0.25 mg of 250 mcg p.o. daily.  5. Lisinopril 20 mg p.o. daily.  Note:  Diltiazem will be      discontinued.  6. Cardura.  7. Doxazosin 4 mg p.o. every night.  8. Finasteride 5 mg p.o. daily  9. Avandamet 4/2 mg p.o. daily  10.Coumadin for goal INR between 2 and 3.  Her coagulations to be      checked by primary care doctor or primary cardiologist.  Full      instructions given to the patient including risks of not checking      the patient's coags and Coumadin toxicity.  11.Torsemide will be discontinued.  12.Crestor 10 mg p.o. daily.  13.Metoprolol 50 mg p.o. b.i.d.; holding parameter instructions given      to the patient.  Holding parameters:  Hold for systolic blood      pressure less than 100 or pulse less  than 60.  14.Darvocet 100/650 p.o. every 6 hours as needed.  15.Vitamin C.  16.Lower vitamin D.  17.Glucosamine.   DIET:  Heart-healthy diet.   INSTRUCTIONS:  Given to the patient.  Follow up with Dr. Aleen Campi,  appointment within 7-10 days, phone number [phone].      Lucita Ferrara, MD  Electronically Signed     RR/MEDQ  D:  05/16/2008  T:  05/16/2008  Job:  [id number]

## 2010-10-28 NOTE — Op Note (Signed)
NAMETERREON, EKHOLM NO.:  [id number]   MEDICAL RECORD NO.:  [id number]          PATIENT TYPE:  AMB   LOCATION:  ENDO                         FACILITY:  [facility]   PHYSICIAN:  Georgiana Spinner, M.D.    DATE OF BIRTH:  [birth year]   DATE OF PROCEDURE:  DATE OF DISCHARGE:                               OPERATIVE REPORT   PROCEDURE:  Upper endoscopy.   INDICATIONS:  Hemoccult positivity.   ANESTHESIA:  Fentanyl 50 mcg, Versed 5 mg.   PROCEDURE IN DETAIL:  With the patient mildly sedated in the left  lateral decubitus position, the Pentax videoscopic endoscope was  inserted into the mouth, passed under direct vision into the esophagus,  which appeared normal, into the stomach.  Fundus, body, antrum, duodenal  bulb, second portion of the duodenum were visualized.  From this point,  the endoscope was slowly withdrawn, taking circumferential views of the  duodenal mucosa until the endoscope had been pulled back, and the  stomach placed in retroflexion to view the stomach from below.  The  endoscope was straightened and withdrawn, taking circumferential views  of the remaining gastric and esophageal mucosa, stopping to photograph  and biopsy the fundus and cardia of the stomach, where in the fundus it  appeared that there was a diffuse gastritis, and in the cardia there  were linear ulcerations and erosions that were with just a red streaking  noted, and these were biopsied.  The endoscope was withdrawn, taking  circumferential views of the remaining gastric and esophageal mucosa.  The patient's vital signs, pulse oximeter remained stable.  The patient  tolerated the procedure well, without apparent complications.   FINDINGS:  Diffuse gastritis, mild of the fundus of the stomach and  erythema in a linear fashion in the cardia of the stomach, biopsied as  well.  Await biopsy reports.  The patient will call me for results, and  follow-up with me as an outpatient.  Proceed to  colonoscopy.           ______________________________  Georgiana Spinner, M.D.     GMO/MEDQ  D:  11/16/2007  T:  11/16/2007  Job:  [id number]

## 2010-10-28 NOTE — Consult Note (Signed)
[name], [name]:  [id number]   MEDICAL RECORD NO.:  [id number]          PATIENT TYPE:  INP   LOCATION:  4710                         FACILITY:  [facility]   PHYSICIAN:  Hillis Range, MD       DATE OF BIRTH:  December 23, 1929   DATE OF CONSULTATION:  05/14/2008  DATE OF DISCHARGE:                                 CONSULTATION   CONSULTING PHYSICIAN:  Antionette Char, MD   REASON FOR CONSULTATION:  Syncope.   HISTORY OF PRESENT ILLNESS:  Paul Dalton is a pleasant 77 year old  gentleman with a history of prior syncope and coronary ectasia who now  presents following a syncopal episode.  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  Upon waking, the patient was confused and required  several minutes before returning to his baseline.  He denies chest pain,  palpitations, heart racing, or neurologic symptoms preceding the  event.  He then returned to his baseline health state.  He was brought  to [facility] for further evaluation.  Thus far the patient's  hospitalization has included interrogation of his pacemaker, which  reveals an R wave of greater than 12 mV with a threshold of 0.5 volts at  0.5 milliseconds and a right ventricular lead impedance of 617 ohms with  greater than 99% ventricular pacing.  The patient's underlying rhythm  was atrial fibrillation with a ventricular rate of approximately 45  beats per minute.  Tachycardia episode triggers had been programmed off  and therefore no further information could be gained from the  interrogation; however, the device was felt to be functioning  appropriately.  The patient has done well since that time without  nausea, vomiting, fevers, chills, chest pain, shortness of breath,  presyncope, syncope, palpitations, or neurologic symptoms.  The patient  reports having a  similar episode of syncope back in February 2009.  At  that time, he was sitting on his bed shaving.  He reports acute  dizziness followed by loss of consciousness for several seconds.  A  clear etiology for his syncope at that time could not be determined.  He  denies any other episodes of syncope or presyncope.  He is otherwise  without complaint today.   PAST MEDICAL HISTORY:  1. Coronary artery disease status post left heart catheterization on      August 15, 2002, which revealed moderate ectasia of the proximal LAD      and circumflex vessels with moderate-to-severe ectasia of the mid      segment of the right coronary artery.  The left ventricular      ejection fraction was 60% at that time.  A recent stress test      performed in April 2009 reveals an ejection fraction of 67% with no      ischemia.  2. Hypertension.  3. Diabetes mellitus.  4. Hyperlipidemia.  5. Permanent atrial fibrillation.  6. Status post pacemaker implanted in 2003  for sick sinus syndrome      with tachycardia-bradycardia syndrome and syncope.  The pulse      generator was recently placed on July 19, 2007, with a [employer]      [facility] model [id number] (serial number [id number]) pacemaker.  7. Benign prostatic hypertrophy.  8. Hyperlipidemia.   ALLERGIES:  IMDUR and SOTALOL.   HOME MEDICATIONS:  1. Metformin 1000 mg b.i.d.  2. Omeprazole 20 mg b.i.d.  3. Digoxin 250 mcg daily.  4. Lisinopril 20 mg daily.  5. Diltiazem 360 mg daily.  6. Crestor 10 mg daily.  7. Metoprolol 50 mg b.i.d.  8. Coumadin to maintain an INR between 2 and 3.  9. Doxazosin 4 mg daily.  10.Avandaryl 4/2 mg daily.  11.Finasteride 5 mg daily.  12.Torsemide 10 mg daily.  13.Darvocet q.6 h. p.r.n.  14.Glucosamine/chondroitin daily.  15.Vitamin C 500 mg daily.  16.Vitamin D 400 units b.i.d.   SOCIAL HISTORY:  The patient denies tobacco, alcohol, or drug use.   FAMILY HISTORY:  The patient is unaware of any family history of  sudden  death.   REVIEW OF SYSTEMS:  All systems are reviewed and negative except as  outlined in the HPI above.   PHYSICAL EXAMINATION:  VITALS:  Blood pressure 133/77, heart rate 70,  respirations 18, sats 95%, temperature 97.9.  GENERAL:  The patient is a well-appearing male in no acute distress.  He  is alert and oriented x3.  HEENT:  Normocephalic, atraumatic.  Sclerae clear.  Conjunctivae pink.  Oropharynx clear.  NECK:  Supple.  No JVD, lymphadenopathy, or bruits.  LUNGS:  Clear to auscultation bilaterally.  HEART:  Regular rate and rhythm.  No murmurs, rubs, or gallops.  GI:  Soft, nontender, nondistended.  Positive bowel sounds.  EXTREMITIES:  No clubbing, cyanosis, or edema.  NEUROLOGIC:  Cranial nerves II through XII are intact.  Strength and  sensation are intact.  MUSCULOSKELETAL:  No deformity or atrophy.  PSYCH:  Euthymic mood.  Full affect.   EKG reveals atrial fibrillation with a ventricular-paced rhythm at 70  beats per minute.   LABORATORY DATA:  INR 2.3, hematocrit 39, creatinine 0.88, glucose 146.  TSH 0.822.  Digoxin 1.1.  Hemoglobin A1c 7.4.   IMPRESSION:  Paul Dalton is a very pleasant 77 year old gentleman with a  history of coronary artery ectasia, preserved ejection fraction, and  permanent atrial fibrillation status post pacemaker implantation for  tachycardia-bradycardia syndrome who now presents with syncope.  The  patient certainly has a concerning history of 2 episodes of syncope over  the past year, most recently on May 13, 2008.  The underlying cause  for his syncope is unclear.  I think that a bradycardic event is  unlikely as his pacemaker appears to be functioning appropriately.  Unfortunately, arrhythmia episodes are programmed off and we can  therefore not rule out a tachycardia as the underlying cause for his  episode.  Certainly neurocardiogenic syncope is possible as well as  noncardiac causes.   PLAN:  1. A transthoracic  echocardiogram has been ordered and we will follow      up on this to evaluate for structural heart disease.  2. I will review the patient's pacemaker strips and we will plan to      turn arrhythmia detections on.  3. Once the patient's echocardiogram has been reviewed, we will then      consider further diagnostic evaluation.  In the interim, I think it  would be prudent to discontinue any medications, which may have      contributed to the patient's symptoms if they are unnecessary.      Specifically, we might consider stopping Demadex and diltiazem.      Should the patient have rapid ventricular rates upon stopping his      diltiazem, this would need to be increased in the future.      Hillis Range, MD  Electronically Signed     JA/MEDQ  D:  05/15/2008  T:  05/15/2008  Job:  [id number]   cc:   Antionette Char, MD

## 2010-10-28 NOTE — Op Note (Signed)
NAMEMACKLEN, WILHOITE NO.:  [id number]   MEDICAL RECORD NO.:  [id number]          PATIENT TYPE:  AMB   LOCATION:  ENDO                         [facility]ACILITY:  [facility]   PHYSICIAN:  Georgiana Spinner, M.D.    DATE OF BIRTH:  March 30, 1930   DATE OF PROCEDURE:  11/16/2007  DATE OF DISCHARGE:                               OPERATIVE REPORT   PROCEDURE:  Colonoscopy.   INDICATIONS:  Hemoccult positivity.   ANESTHESIA:  Fentanyl 50 mcg, Versed 5 mg.   PROCEDURE:  With the patient mildly sedated in the left lateral  decubitus position, a rectal exam was performed which was unremarkable  to my exam.  Subsequently, the Pentax videoscopic colonoscope was  inserted in the rectum, passed under direct vision to the cecum  identified by ileocecal valve and appendiceal orifice, both of which  were photographed.  From this point the colonoscope was slowly withdrawn  taking circumferential views of colonic mucosa, stopping in the rectum  which appeared normal on direct and showed hemorrhoids on retroflexed  view.  The endoscope was straightened and withdrawn.  The patient's  vital signs and pulse oximeter remained stable.  The patient tolerated  procedure well without apparent complications.   FINDINGS:  Internal hemorrhoids, otherwise an unremarkable exam.   PLAN:  See endoscopy note for further details.           ______________________________  Georgiana Spinner, M.D.     GMO/MEDQ  D:  11/16/2007  T:  11/16/2007  Job:  [id number]

## 2010-10-28 NOTE — H&P (Signed)
Paul Dalton, GOOSTREE NO.:  [id number]   MEDICAL RECORD NO.:  [id number]          PATIENT TYPE:  INP   LOCATION:  1830                         FACILITY:  [facility]   PHYSICIAN:  Eduard Clos, MDDATE OF BIRTH:  [birth year]   DATE OF ADMISSION:  05/13/2008  DATE OF DISCHARGE:                              HISTORY & PHYSICAL   PRIORITY ADMISSION HISTORY AND PHYSICAL   PRIMARY CARE PHYSICIAN:  Soyla Murphy. Renne Crigler, MD.   PRIMARY CARDIOLOGIST:  Aram Candela. Tysinger, MD.   CHIEF COMPLAINT:  Loss of consciousness.   HISTORY OF PRESENTING ILLNESS:  A 77 year old male with a history of  tachybrady syndrome status post pacemaker placement, on Coumadin,  hypertension, diabetes mellitus type 2, BPH, hyperlipidemia, who was  brought into the ER.  The patient had an episode of loss of  consciousness.  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  Before the class, he had a brief episode of dizziness, but patient went  ahead and did his lecture, and towards the end of the lecture patient  suddenly lost his consciousness.  He is not able to remember how long he  lost his consciousness but feels that maybe less than 5 minutes, had no  associated tongue bites, or incontinence of urine, denies any chest pain  prior or after the incident, denies any shortness of breath, abdominal  pain, nausea, vomiting, diarrhea, fever, chills, headache, does have  some pain in the lower back after the fall, which is not increased by  his leg movements.  Already had a pacemaker interrogation done, and at  this time patient has been admitted to rule out any arrhythmias and for  further evaluation for his syncope.   PAST MEDICAL HISTORY:  1. Hypertension.  2. BPH.  3. History of tachybrady syndrome on Coumadin status post pacemaker      placement.  4. Hyperlipidemia.   PAST SURGICAL HISTORY:  1. Pacemaker placement.  2. Appendectomy.   MEDICATIONS PRIOR TO ADMISSION:  The patient is  on:  1. Darvocet N-100/650 p.o. q.6h p.r.n. for pain.  2. Glucosamine/Chondroitin.  3. Vitamin C 500 mg p.o. daily.  4. Vitamin B+ 400 units 1 tablet p.o. b.i.d.  5. Metformin 1000 mg p.o. b.i.d.  6. Omeprazole 20 mg p.o. b.i.d.  7. Digoxin 250 mcg p.o. daily.  8. Omeprazole 20 mg p.o. daily.  9. Diltiazem 360 mg p.o. daily.  10.Crestor 10 mg p.o. daily.  11.Metoprolol 50 mg p.o. b.i.d.  12.Warfarin and Doxazosin 4 mg p.o. daily.  13.Avandaryl 4/2 mg p.o. daily.  14.Finasteride 5 mg p.o. daily.  15.Furosemide 10 mg p.o. daily.   ALLERGIES:  1. IMDUR.  2. SOTALOL.   SOCIAL HISTORY:  The patient lives with his wife.  Denies smoking  cigarettes, drinking alcohol, or using illegal drugs.   FAMILY HISTORY:  Nothing contributory.   REVIEW OF SYSTEMS:  As in the History of Presenting Illness, nothing  else significant.   PHYSICAL EXAMINATION:  GENERAL:  Patient examined at bedside not in  acute distress.  VITAL SIGNS:  Blood pressure is 136/70,  pulse 69 per minute, temperature  97, respirations 18 per minute, O2 SAT 99%.  HEENT:  Anicteric, no pallor, no facial asymmetry, PERRLA positive.  CHEST:  Bilateral air entry present, no rhonchi, no crepitation.  HEART:  S1 S2 heard.  ABDOMEN:  Soft and nontender, bowel sounds heard.  CNS:  Alert, awake, and oriented to time, place, and person, moves upper  and lower extremities 5/5.  EXTREMITIES:  Peripheral pulses are felt, no edema.   LABORATORY DATA:  Chest x-ray:  No acute disease, stable chronic and  postoperative changes.  CT of the C-spine:  Negative for fracture or  other acute bony injury, a 2-mm anterolisthesis at C3-4 and C4-5, which  may be related to degenerative change most marked at these levels.  CT  of the head without contrast:  Negative for bleed or other acute  intracranial process.  EKG shows a paced rhythm, which is comparable to  the old EKG.  The patient has already had a pacemaker interrogation.  CBC:  WBC is  7.8, hemoglobin 13.8, hematocrit 40.9, platelets 206,  neutrophils 68%.  Comprehensive metabolic panel:  Sodium 138, potassium  3.9, chloride 102, carbon dioxide 28, glucose 145, BUN 20, creatinine  0.9.  Total bilirubin 0.6, alkaline-phosphatase 44, AST 28, ALT 25,  albumin 4, calcium 9.3.  CK-MB 2.6, troponin-I less than 0.05.  UA is  negative for ketones, blood, nitrites, leukocytes.   ASSESSMENT:  1. Syncope to rule out any arrhythmias.  2. Diabetes mellitus type 2 presently not hyperglycemic.  3. Hypertension.  4. History of congestive heart failure probably diastolic.  5. Benign prostatic hypertrophy.  6. Hyperlipidemia.   PLAN:  Will admit the patient to telemetry, will cycle cardiac markers,  monitor for any arrhythmias, get a two-D echo, will consider a  Cardiology consult with Dr. Aleen Campi, who is well-known to him, and  further recommendations as condition evolves.      Eduard Clos, MD  Electronically Signed     ANK/MEDQ  D:  05/13/2008  T:  05/13/2008  Job:  [id number]

## 2010-10-31 NOTE — Discharge Summary (Signed)
Paul Dalton, Paul Dalton                          ACCOUNT NO.:  [id number]   MEDICAL RECORD NO.:  [id number]                   PATIENT TYPE:  INP   LOCATION:  2003                                 FACILITY:  [facility]   PHYSICIAN:  Aram Candela. Tysinger, M.D.              DATE OF BIRTH:  1929/12/01   DATE OF ADMISSION:  03/12/2002  DATE OF DISCHARGE:  03/18/2002                                 DISCHARGE SUMMARY   FINAL DIAGNOSES:  1. Tachybrady syndrome with recurrent atrial fibrillation with slow     ventricular response.  2. Near syncope secondary to tachybrady syndrome.  3. Coronary artery disease with coronary ectasia.   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  His heart rate was in the 40s at home and he was brought to the emergency  room after feeling very weak and fatigued.  In the emergency room, he was  noted to be in atrial fibrillation with a very slow ventricular response  with mild to moderate pauses of two to three seconds and with heart rates in  the upper 30s to lower 40s.  He had a history of recurrent bouts of atrial  fibrillation and was on medications for control of his ventricular response  because of previous rapid ventricular response and marked weakness during  those episodes.   HOSPITAL COURSE:  On admission all chronotropic inhibiting medications were  held including Lanoxin and metoprolol because of his slow rate and also his  hypertensive medications were held because of a low blood pressure.  He was  admitted to a telemetry bed where his heart rate gradually increased over  the next two days until he was then having a rapid ventricular response  which also caused marked weakness.  A review of his records from  the office  showed prior episodes of paroxysmal atrial fibrillation associated with  rapid ventricular response and a recent echo had shown relatively normal  heart chamber sizes.  He was then scheduled for a cardioversion which he  underwent without problems on March 15, 2002, being successfully  cardioverted to a sinus rhythm with 300 joules and had a sinus bradycardia  with a first degree AV block.  He then had indications for permanent pacing  with recurrent episodes of atrial fib and therapy-induced severe bradycardia  during the atrial fib causing near syncope and marked weakness and shortness  of breath, and was scheduled for permanent pacing when his prothrombin time  was decreased to an acceptable level.  He then underwent insertion of the  permanent pacemaker on March 17, 2002, without complications, and obtained  very good pacing parameters.  With the pacer in place, he  was re-started on  all his previous medications and was stable for discharge on March 18, 2002.  He was discharged in stable improved condition.   DISCHARGE MEDICATIONS:  1. Coumadin as previously dosed.  2. Prilosec 20 mg q.d.  3. Aspirin 81 mg q.d.  4. Lanoxin 0.25 mg q.d.  5. Tiazac 180 mg q.d.  6. Lisinopril 10 mg q.d.  7. Metoprolol 50 mg q.d.  8. Hytrin 5 mg q.h.s.  9. Simvastatin 10 mg q.h.s.  10.      Pain management; Tylenol p.r.n.   ACTIVITY LEVEL:  No restrictions.   DIET:  Low-cholesterol, low-fat.   WOUND CARE:  He was instructed to keep the wound dry for one week while  leaving his Steri-Strips in place.   SPECIAL INSTRUCTIONS:  Call the office to arrange for a follow up  appointment in one week to check his pacemaker and his pacemaker wound.  Will also review his medications and change his medications accordingly at  that time.                                               John R. Tysinger, M.D.    JRT/MEDQ  D:  03/30/2002  T:  04/01/2002  Job:  [id number]

## 2010-10-31 NOTE — Discharge Summary (Signed)
Paul Dalton, Paul Dalton NO.:  [id number]   MEDICAL RECORD NO.:  [id number]                   PATIENT TYPE:  INP   LOCATION:                                       FACILITY:  [facility]   PHYSICIAN:  Aram Candela. Tysinger, M.D.              DATE OF BIRTH:  [birth year]   DATE OF ADMISSION:  08/13/2002  DATE OF DISCHARGE:  08/15/2002                                 DISCHARGE SUMMARY   REASON FOR ADMISSION:  This 77 year old male with a history of hypertension,  hyperlipidemia and coronary ectasia had onset of chest pain on August 13, 2002 described as small, sharp pains. It felt like bone was breaking and  sharp pain under his left breast area. He had persistent pain then in his  chest and left arm and came to the emergency room for evaluation. He was  admitted from the emergency room to rule out myocardial infarction. His  initial enzymes were negative.   HOSPITAL COURSE:  After admission, the patient had no further chest pain and  his enzymes remained normal. On review of his office chart, it was noted  that his last stress test was in July of 2003 with a Cardiolite study which  showed evidence of lateral ischemia. He was considered to be a candidate for  repeat cardiac catheterization because of his high risk to have progression  of disease since 1988 and with evidence of ischemia on his last stress test.  He then underwent cardiac catheterization on August 15, 2002 finding moderate  to severe coronary ectasia. The right was greater than the left and with  possible increase in ectasia since his last catheterization in 1988. His  left ventricular function remained normal and he had no evidence for  significant stenotic lesions. With this finding, it was felt that he should  be continued on his medical management of chronic anticoagulation and after  stabilizing post catheterization, he was sent home in stable condition.   DISPOSITION:  To home in stable  condition.   DISCHARGE MEDICATIONS:  1. Metoprolol 50 mg twice a day.  2. Aspirin 81 mg each day.  3. Metformin 1,000 mg twice a day.  4. Digoxin 0.25 mg each day.  5. Lisinopril 20 mg each day.  6. Coumadin 5 mg each morning on Tuesday, Thursday, Saturday and Sunday and     2.5 mg on Monday, Wednesday and Friday.  7. Multivitamins each day.  8. Simvastatin 10 mg at bedtime.  9. Trazolan 2 mg at bedtime.  10.      Cardizem 180 mg at bedtime.   ACTIVITY:  The patient was instructed not to do heavy lifting or straining  for two days post catheterization then resume full activity.   DIET:  Low fat, low cholesterol.    FOLLOW UP:  With Dr. Aleen Campi in 2-3 weeks post discharge and with Dr.  Merri Brunette  as routine.   CONDITION ON DISCHARGE:  Improved and stable.                                               John R. Tysinger, M.D.    JRT/MEDQ  D:  09/20/2002  T:  09/21/2002  Job:  [id number]   cc:   Soyla Murphy. Renne Crigler, M.D.  [address]  [place]  [place] [address]  Fax: [phone]

## 2010-10-31 NOTE — Cardiovascular Report (Signed)
Paul Dalton, WIETING                          ACCOUNT NO.:  [id number]   MEDICAL RECORD NO.:  [id number]                   PATIENT TYPE:  INP   LOCATION:  3710                                 FACILITY:  [facility]   PHYSICIAN:  Aram Candela. Tysinger, M.D.              DATE OF BIRTH:  19-Oct-1929   DATE OF PROCEDURE:  08/15/2002  DATE OF DISCHARGE:                              CARDIAC CATHETERIZATION   PROCEDURES:  1. Left heart catheterization.  2. Coronary cineangiography.  3. Left ventricular cineangiography.  4. Abdominal aortogram.  5. Perclose of the right femoral artery.   INDICATION FOR PROCEDURES:  This 77 year old male was admitted with severe  anterior chest pain and has a history of significant coronary ectasia  causing chest pain and prior cardiac catheterization in 1988.  He has been  treated with anticoagulation and control of blood pressure since then with  fairly good clinical success.  He now returns with severe chest pain and was  felt to be a high risk for progression of disease since 1988.   DESCRIPTION OF PROCEDURE:  After signing an informed consent, the patient  was premedicated with 5 mg of Valium by mouth and brought to the cardiac  catheterization laboratory.  His right groin was prepped and draped in a  sterile fashion and anesthetized locally with 1% lidocaine.  A 6 French  introducer sheath was inserted percutaneously into the right femoral artery.  A 6 French #4 Judkins coronary catheters were used to make injections into  the native coronary arteries.  A 6 French pigtail catheter was used to  measure pressures in the left ventricle and aorta and to make mid stream  injections into the left ventricle and abdominal aorta. The patient  tolerated the procedure well and no complications were noted.  At the end of  the procedure, the catheter and sheath were removed from the right femoral  artery and hemostasis was easily obtained with a Perclose closure  system.   MEDICATIONS GIVEN:  None.   HEMODYNAMIC DATA:  Left ventricular pressure 142/6-9, aortic pressure 140/79  with a mean of 106.  Left ventricular ejection fraction estimated at 60%.   CINE FINDINGS:   CORONARY CINE ANGIOGRAPHY:  1. Left coronary artery:  The ostium and left main appear normal.  2. Left anterior descending:  The proximal LAD has moderate ectasia causing     a segment approximately 15 mm in length to be dilated to approximately 6     to 7 mm in diameter.  The remainder of the LAD has mild ectatic areas but     has fairly good antegrade flow and there was no significant dye hangup in     the proximal LAD. There were no areas of plaque showing stenosis.  3. Circumflex coronary artery:  The circumflex has a moderate ectatic area     in its middle segment over a 8 to  9 mm length and causes a dilation of     approximately 6 mm in diameter.  There is no significant dye hangup     within this ectatic area.  There were no areas of stenosis and there was     fairly normal distal runoff.  4. Right coronary artery:  The ostium and proximal segment appears normal.     The middle segment has a moderate to severe area of ectasia over a long     segment of approximately 25 mm in length and there is marked dye hangup     within this dilated segment, which is approximately 8 mm in diameter.     There is a second moderate dilated area at the acute angle.  This is     approximately 10 mm in length.  The distal portion of the right coronary     artery appears normal with a large posterior descending branch which     appears normal and a small distal posterolateral branch, also normal in     appearance.   LEFT VENTRICULAR CINEANGIOGRAM:  The left ventricular chamber size is mildly  enlarged.  Left ventricular wall thickness and contractility pattern appear  normal.  There were no abnormally moving segments.  The ejection fraction  was normal at approximately 60%.  The mitral and  aortic valves appear  normal.   ABDOMINAL AORTOGRAM:  The abdominal aorta is mildly tortuous.  The  suprarenal aorta is normal in appearance.  The infrarenal abdominal aorta  has diffuse atherosclerotic plaque which is not obstruction  appears normal.  The renal arteries also appear normal.  The infrarenal abdominal aorta has a  mild dilated area consistent with a mild abdominal aortic aneurysm prior to  the bifurcation.  The distal bifurcation was not visualized.   FINAL DIAGNOSES:  1. Moderate to severe coronary ectasia, more on the right than the left with     possible increase in diameter in the right coronary artery since 1988.  2. Normal left ventricular function.  3. Infrarenal atherosclerotic plaque in the abdominal aorta, nonobstructive     with moderate calcification.  4. Normal renal arteries.  5. Successful Perclose to the right femoral artery.   DISPOSITION:  With a similar appearance in his coronary ectasia since 1988,  we will continue him on the medical treatment primarily with anticoagulation  and control of his blood pressure.  Will arrange for him to be discharged  home later today when stable postcatheterization.                                                   John R. Tysinger, M.D.    JRT/MEDQ  D:  08/15/2002  T:  08/15/2002  Job:  [id number]   cc:   Cardiac Cathtererization Lab

## 2010-10-31 NOTE — Op Note (Signed)
NAME:  Paul Dalton, Paul Dalton                          ACCOUNT NO.:  [id number]   MEDICAL RECORD NO.:  [id number]                   PATIENT TYPE:  AMB   LOCATION:  ENDO                                 [facility]ACILITY:  [facility]   PHYSICIAN:  Georgiana Spinner, M.D.                 DATE OF BIRTH:  [birth year]   DATE OF PROCEDURE:  DATE OF DISCHARGE:                                 OPERATIVE REPORT   PROCEDURE:  Upper endoscopy.   INDICATION:  GERD.   ANESTHESIA:  Demerol 50 mg, Versed 5 mg.   DESCRIPTION OF PROCEDURE:  With the patient mildly sedated in the left  lateral decubitus position, the Olympus videoscopic endoscope was inserted  into the mouth and passed under direct vision through the esophagus which  appeared normal.  We entered the stomach, fundus, body, antrum, duodenal  bulb, and second portion of the duodenum.  From this point, the endoscope  was slowly withdrawn, taking circumferential views of the duodenal mucosa  until the endoscope was pulled back into the stomach and placed in  retroflexion, viewing the stomach from below.  The endoscope was  straightened and withdrawn, taking circumferential views of the remaining  gastric and esophageal mucosa, stopping at the fundus of the stomach where  the tissue appeared to be erythematous.  The patient's vital signs and pulse  oximetry remained stable and the patient tolerated the procedure well  without apparent complications.   FINDINGS:  Erythema of fundus of stomach, biopsied.   PLAN:  Await biopsy report.  The patient will call for results and follow up  with me as an outpatient and proceed to colonoscopy as planned.                                               Georgiana Spinner, M.D.    GMO/MEDQ  D:  07/25/2003  T:  07/25/2003  Job:  [id number]

## 2010-10-31 NOTE — Cardiovascular Report (Signed)
Paul Dalton, Paul Dalton                          ACCOUNT NO.:  [id number]   MEDICAL RECORD NO.:  [id number]                   PATIENT TYPE:  INP   LOCATION:  2003                                 FACILITY:  [facility]   PHYSICIAN:  Aram Candela. Tysinger, M.D.              DATE OF BIRTH:  1929-07-19   DATE OF PROCEDURE:  DATE OF DISCHARGE:                              CARDIAC CATHETERIZATION   REFERRING PHYSICIAN:  Soyla Murphy. Renne Crigler, M.D.   PROCEDURE DONE BY:  Aram Candela. Aleen Campi, M.D.   PROCEDURE:  Insertion of dual-chamber permanent transvenous pacemaker, DDD  mode.   INDICATIONS FOR PACEMAKER:  Sick sinus syndrome with tachybrady syndrome  with episodes of PAF and episodes of severe sinus bradycardia, symptomatic  with dizziness and syncope.   PROCEDURE:  After signing in informed consent, the patient was premedicated  with 10 mg of Valium by mouth and brought to the cardiac catheterization  lab. His right anterior chest and base of the neck were prepped and draped  in a sterile fashion, and a right transverse subclavicular plane was  anesthetized locally with 1% lidocaine. An incision was made in this  anesthetized plane with the incision being deepened into the fascia layer  overlying the pectoralis muscle. A pocket was then formed in this fascial  layer for later insertion of the pulse generator. Two #8 Cook introducer  sheaths were inserted percutaneously into the right subclavian vein. A  pacesetter bipolar ventricular lead was selected, model #1336T, serial  [id number]. After proper preparation, it was inserted through a Cook  introducer sheath. The second lead was selected, a pacesetter bipolar atrial  J lead, model #1342T, serial #[id number]. After proper preparation, it was  inserted through the second [facility] introducer sheath. Both sheaths were  removed in usual fashion. Ventricular lead was then advanced into the right  atrium and right ventricle where the tip was positioned in the apex  of the  right ventricle. Very good pacer parameters were obtained with a minimal  voltage threshold of 0.4 volts, utilizing 0.9 milliamps of current. The  resistance measured 650 ohms, and the R-wave sensitivity measured 13.1  millivolts. The atrial J-lead was then advanced into the right atrium where  the tip was positioned anteriorly in the right atrial appendage. Again, good  pacing parameters were obtained with a minimal voltage threshold of 0.7  volts, utilizing 1.3 millionths current. The resistance measured 460 ohms,  and the P-wave sensitivity was measured at 2.7 millivolts. After obtaining  these patient parameters, the leads were secured properly at the insertion  site. The wound was lavaged profusely with kanamycin solution. We then  selected a [employer] Jude medical pacemaker pulse generator, model Integrity DR,  model B696195, serial [id number]. After properly analyzing, the pulse generator  was attached to the pacing electrodes in usual fashion. The pulse generator  was then placed within the previously formed pocket, and the wound  was  closed in layers using 2-0 Dexon. Final skin closure was obtained  with a cutaneous layer of steri strips. The patient tolerated the procedure,  and no complications were noted at the end of the procedure. A sterile bulky  dressing was applied to the wound, and he was returned to his room in  satisfactory condition. The pacemaker is noted be functioning normally in  the DDD mode.                                               John R. Tysinger, M.D.    JRT/MEDQ  D:  03/17/2002  T:  03/21/2002  Job:  [id number]   cc:   Soyla Murphy. Renne Crigler, M.D.  [address]  [place]  [place] [address]  Fax: [phone]   [facility] cath lab

## 2010-10-31 NOTE — Op Note (Signed)
NAME:  Paul Dalton, HINCHMAN                          ACCOUNT NO.:  [id number]   MEDICAL RECORD NO.:  [id number]                   PATIENT TYPE:  AMB   LOCATION:  ENDO                                 FACILITY:  [facility]   PHYSICIAN:  Georgiana Spinner, M.D.                 DATE OF BIRTH:  13-Dec-1929   DATE OF PROCEDURE:  DATE OF DISCHARGE:                                 OPERATIVE REPORT   PROCEDURE:  Colonoscopy.   INDICATION:  Rectal bleeding.   ANESTHESIA:  Demerol 10 mg, Versed 1 mg additionally.   DESCRIPTION OF PROCEDURE:  With the patient mildly sedated in the left  lateral decubitus position, the Olympus videoscopic colonoscope was inserted  in the rectum and passed under direct vision to the cecum identified by the  ileocecal valve and appendiceal orifice both of which were photographed.  From this point, the colonoscope was slowly withdrawn, taking  circumferential views of the colonic mucosa and stopping in the rectum which  appeared normal.  The rectum showed hemorrhoids on retroflex view.  The  endoscope was straightened and withdrawn.  The patient's vital signs and  pulse oximetry remained stable, the patient tolerated the procedure well  without apparent complications.   FINDINGS:  Internal hemorrhoids; otherwise unremarkable examination.   PLAN:  Repeat examination in approximately 5-10 years.                                               Georgiana Spinner, M.D.    GMO/MEDQ  D:  07/25/2003  T:  07/25/2003  Job:  [id number]

## 2010-12-24 ENCOUNTER — Telehealth: Payer: Self-pay | Admitting: Internal Medicine

## 2010-12-24 NOTE — Telephone Encounter (Addendum)
Pt had an episode Monday, went weak, thought he was going to pass out, grabbed the counter, then sat down and a few seconds felt better, thought he had a seizure, had one like that before, was told later that it was a mini stroke, saw dr. Judie Grieve yesterday, who said to let dr taylor know to see if he may want to see him, pt has diabetes he checked sugar and heart rate and pulse after the episode and everything was at a normal range-pls advise  pt [phone]

## 2010-12-24 NOTE — Telephone Encounter (Signed)
Patient has not done a transmission over the phone since he left Dr Star Age office. Just checks his device yearly  Wonders if it was a mini stroke [facility] [phone]

## 2010-12-25 ENCOUNTER — Ambulatory Visit (INDEPENDENT_AMBULATORY_CARE_PROVIDER_SITE_OTHER): Payer: Medicare Other | Admitting: *Deleted

## 2010-12-25 DIAGNOSIS — I495 Sick sinus syndrome: Secondary | ICD-10-CM

## 2010-12-25 DIAGNOSIS — I4891 Unspecified atrial fibrillation: Secondary | ICD-10-CM

## 2010-12-25 NOTE — Telephone Encounter (Signed)
Paul Dalton is going to call patient and have him come in today to have his PPM interrogated while Dr Ladona Ridgel is in the office If there is a problem we then we can have Dr Ladona Ridgel address

## 2011-03-06 LAB — CBC
Hemoglobin: 12 — ABNORMAL LOW
MCV: 93.9
RBC: 3.67 — ABNORMAL LOW
WBC: 6.8

## 2011-03-06 LAB — BASIC METABOLIC PANEL
CO2: 29
Chloride: 106
Creatinine, Ser: 0.92
GFR calc Af Amer: >60
Potassium: 4.4
Sodium: 141

## 2011-03-06 LAB — PROTIME-INR: Prothrombin Time: 16.4 — ABNORMAL HIGH

## 2011-03-17 LAB — URINALYSIS, ROUTINE W REFLEX MICROSCOPIC
Glucose, UA: NEGATIVE mg/dL
Protein, ur: NEGATIVE mg/dL
Specific Gravity, Urine: 1.01 (ref 1.005–1.030)
Urobilinogen, UA: 0.2 mg/dL (ref 0.0–1.0)

## 2011-03-17 LAB — CARDIAC PANEL(CRET KIN+CKTOT+MB+TROPI)
CK, MB: 1.8 ng/mL (ref 0.3–4.0)
Relative Index: INVALID (ref 0.0–2.5)
Relative Index: INVALID (ref 0.0–2.5)
Total CK: 50 U/L (ref 7–232)
Total CK: 61 U/L (ref 7–232)
Troponin I: 0.01 ng/mL (ref 0.00–0.06)

## 2011-03-17 LAB — COMPREHENSIVE METABOLIC PANEL
AST: 28 U/L (ref 0–37)
Albumin: 4 g/dL (ref 3.5–5.2)
Alkaline Phosphatase: 44 U/L (ref 39–117)
Chloride: 102 mEq/L (ref 96–112)
GFR calc Af Amer: >60 mL/min (ref >60)
Potassium: 3.9 mEq/L (ref 3.5–5.1)
Total Bilirubin: 0.6 mg/dL (ref 0.3–1.2)

## 2011-03-17 LAB — CBC
HCT: 40.9 % (ref 39.0–52.0)
MCHC: 34.6 g/dL (ref 30.0–36.0)
Platelets: 194 10*3/uL (ref 150–400)
Platelets: 206 10*3/uL (ref 150–400)
RBC: 4.21 MIL/uL — ABNORMAL LOW (ref 4.22–5.81)
RDW: 13.5 % (ref 11.5–15.5)
WBC: 7.8 10*3/uL (ref 4.0–10.5)

## 2011-03-17 LAB — BASIC METABOLIC PANEL
BUN: 16 mg/dL (ref 6–23)
CO2: 28 mEq/L (ref 19–32)
Calcium: 9.4 mg/dL (ref 8.4–10.5)
Creatinine, Ser: 0.88 mg/dL (ref 0.4–1.5)
Glucose, Bld: 146 mg/dL — ABNORMAL HIGH (ref 70–99)

## 2011-03-17 LAB — DIFFERENTIAL
Basophils Absolute: 0 10*3/uL (ref 0.0–0.1)
Basophils Relative: 0 % (ref 0–1)
Eosinophils Absolute: 0.1 10*3/uL (ref 0.0–0.7)
Eosinophils Relative: 1 % (ref 0–5)
Monocytes Absolute: 0.7 10*3/uL (ref 0.1–1.0)

## 2011-03-17 LAB — CK TOTAL AND CKMB (NOT AT ARMC)
CK, MB: 2.8 ng/mL (ref 0.3–4.0)
Total CK: 74 U/L (ref 7–232)

## 2011-03-17 LAB — GLUCOSE, CAPILLARY
Glucose-Capillary: 142 mg/dL — ABNORMAL HIGH (ref 70–99)
Glucose-Capillary: 165 mg/dL — ABNORMAL HIGH (ref 70–99)
Glucose-Capillary: 183 mg/dL — ABNORMAL HIGH (ref 70–99)

## 2011-03-17 LAB — HEMOGLOBIN A1C: Mean Plasma Glucose: 166 mg/dL

## 2011-03-17 LAB — DIGOXIN LEVEL: Digoxin Level: 1.1 ng/mL (ref 0.8–2.0)

## 2011-03-17 LAB — POCT CARDIAC MARKERS: Troponin i, poc: <0.05 ng/mL (ref 0.00–0.09)

## 2011-03-17 LAB — TSH: TSH: 0.822 u[IU]/mL (ref 0.350–4.500)

## 2011-03-17 LAB — MAGNESIUM: Magnesium: 2.1 mg/dL (ref 1.5–2.5)

## 2011-03-19 LAB — CBC
HCT: 37.5 % — ABNORMAL LOW (ref 39.0–52.0)
MCHC: 34.4 g/dL (ref 30.0–36.0)
MCV: 96.9 fL (ref 78.0–100.0)
Platelets: 171 10*3/uL (ref 150–400)
RDW: 13.5 % (ref 11.5–15.5)

## 2011-03-19 LAB — BASIC METABOLIC PANEL
BUN: 16 mg/dL (ref 6–23)
BUN: 17 mg/dL (ref 6–23)
Chloride: 103 mEq/L (ref 96–112)
Chloride: 107 mEq/L (ref 96–112)
GFR calc non Af Amer: >60 mL/min (ref >60)
Glucose, Bld: 100 mg/dL — ABNORMAL HIGH (ref 70–99)
Potassium: 3.7 mEq/L (ref 3.5–5.1)
Potassium: 3.8 mEq/L (ref 3.5–5.1)

## 2011-03-19 LAB — PROTIME-INR
INR: 2.3 — ABNORMAL HIGH (ref 0.00–1.49)
INR: 2.3 — ABNORMAL HIGH (ref 0.00–1.49)
Prothrombin Time: 26.7 seconds — ABNORMAL HIGH (ref 11.6–15.2)

## 2011-03-19 LAB — GLUCOSE, CAPILLARY
Glucose-Capillary: 133 mg/dL — ABNORMAL HIGH (ref 70–99)
Glucose-Capillary: 143 mg/dL — ABNORMAL HIGH (ref 70–99)
Glucose-Capillary: 180 mg/dL — ABNORMAL HIGH (ref 70–99)

## 2011-05-11 ENCOUNTER — Other Ambulatory Visit: Payer: Self-pay | Admitting: Urology

## 2011-05-22 LAB — PACEMAKER DEVICE OBSERVATION
BRDY-0002RV: 75 {beats}/min
BRDY-0004RV: 120 {beats}/min
RV LEAD AMPLITUDE: 12 mv
RV LEAD IMPEDENCE PM: 603 Ohm
RV LEAD THRESHOLD: 0.5 V

## 2011-05-27 ENCOUNTER — Encounter (HOSPITAL_COMMUNITY): Payer: Self-pay | Admitting: Pharmacy Technician

## 2011-05-27 ENCOUNTER — Telehealth: Payer: Self-pay | Admitting: Internal Medicine

## 2011-05-27 NOTE — Telephone Encounter (Signed)
Cardiac faxed to [facility] @ [phone] 05/27/11/KM

## 2011-06-01 ENCOUNTER — Ambulatory Visit (HOSPITAL_COMMUNITY)
Admission: RE | Admit: 2011-06-01 | Discharge: 2011-06-01 | Disposition: A | Discharge status: Home / Self Care | Payer: Medicare Other | Source: Ambulatory Visit | Attending: Urology | Admitting: Urology

## 2011-06-01 ENCOUNTER — Encounter (HOSPITAL_COMMUNITY)
Admission: RE | Admit: 2011-06-01 | Discharge: 2011-06-01 | Disposition: A | Discharge status: Home / Self Care | Payer: Medicare Other | Source: Ambulatory Visit | Attending: Urology | Admitting: Urology

## 2011-06-01 ENCOUNTER — Encounter (HOSPITAL_COMMUNITY): Payer: Self-pay

## 2011-06-01 ENCOUNTER — Other Ambulatory Visit: Payer: Self-pay

## 2011-06-01 DIAGNOSIS — Z950 Presence of cardiac pacemaker: Secondary | ICD-10-CM | POA: Insufficient documentation

## 2011-06-01 DIAGNOSIS — Z01810 Encounter for preprocedural cardiovascular examination: Secondary | ICD-10-CM | POA: Insufficient documentation

## 2011-06-01 DIAGNOSIS — R059 Cough, unspecified: Secondary | ICD-10-CM | POA: Insufficient documentation

## 2011-06-01 DIAGNOSIS — R0989 Other specified symptoms and signs involving the circulatory and respiratory systems: Secondary | ICD-10-CM | POA: Insufficient documentation

## 2011-06-01 DIAGNOSIS — R05 Cough: Secondary | ICD-10-CM | POA: Insufficient documentation

## 2011-06-01 DIAGNOSIS — I4891 Unspecified atrial fibrillation: Secondary | ICD-10-CM | POA: Insufficient documentation

## 2011-06-01 DIAGNOSIS — Z01818 Encounter for other preprocedural examination: Secondary | ICD-10-CM | POA: Insufficient documentation

## 2011-06-01 DIAGNOSIS — E119 Type 2 diabetes mellitus without complications: Secondary | ICD-10-CM | POA: Insufficient documentation

## 2011-06-01 DIAGNOSIS — I10 Essential (primary) hypertension: Secondary | ICD-10-CM | POA: Insufficient documentation

## 2011-06-01 DIAGNOSIS — Z01812 Encounter for preprocedural laboratory examination: Secondary | ICD-10-CM | POA: Insufficient documentation

## 2011-06-01 HISTORY — DX: Cerebral infarction, unspecified: I63.9

## 2011-06-01 HISTORY — DX: Malignant (primary) neoplasm, unspecified: C80.1

## 2011-06-01 HISTORY — DX: Sleep apnea, unspecified: G47.30

## 2011-06-01 HISTORY — DX: Acute myocardial infarction, unspecified: I21.9

## 2011-06-01 HISTORY — DX: Atherosclerotic heart disease of native coronary artery without angina pectoris: I25.10

## 2011-06-01 HISTORY — DX: Headache: R51

## 2011-06-01 HISTORY — DX: Chronic kidney disease, unspecified: N18.9

## 2011-06-01 HISTORY — DX: Acute upper respiratory infection, unspecified: J06.9

## 2011-06-01 HISTORY — DX: Personal history of other diseases of the digestive system: Z87.19

## 2011-06-01 HISTORY — DX: Shortness of breath: R06.02

## 2011-06-01 HISTORY — DX: Heart failure, unspecified: I50.9

## 2011-06-01 HISTORY — DX: Calculus of kidney: N20.0

## 2011-06-01 HISTORY — DX: Myoneural disorder, unspecified: G70.9

## 2011-06-01 HISTORY — DX: Essential (primary) hypertension: I10

## 2011-06-01 HISTORY — DX: Cardiac arrhythmia, unspecified: I49.9

## 2011-06-01 HISTORY — DX: Angina pectoris, unspecified: I20.9

## 2011-06-01 HISTORY — DX: Cardiac murmur, unspecified: R01.1

## 2011-06-01 HISTORY — DX: Unspecified osteoarthritis, unspecified site: M19.90

## 2011-06-01 LAB — BASIC METABOLIC PANEL
GFR calc Af Amer: 80 mL/min — ABNORMAL LOW (ref >90)
GFR calc non Af Amer: 69 mL/min — ABNORMAL LOW (ref >90)
Glucose, Bld: 137 mg/dL — ABNORMAL HIGH (ref 70–99)
Potassium: 3.8 mEq/L (ref 3.5–5.1)
Sodium: 138 mEq/L (ref 135–145)

## 2011-06-01 LAB — CBC
Hemoglobin: 12.6 g/dL — ABNORMAL LOW (ref 13.0–17.0)
MCHC: 34.7 g/dL (ref 30.0–36.0)

## 2011-06-01 LAB — PROTIME-INR
INR: 1.39 (ref 0.00–1.49)
Prothrombin Time: 17.3 seconds — ABNORMAL HIGH (ref 11.6–15.2)

## 2011-06-01 NOTE — Pre-Procedure Instructions (Signed)
Faxed note to Dr Margarita Grizzle regarding chest and head cold symptoms with confirmation. Instructed patient to call as well.  Spoke with Dahlia Client from [facility] who is aware of surgery date and times for pacemaker care

## 2011-06-01 NOTE — Patient Instructions (Addendum)
20 Paul Dalton  06/01/2011   Your procedure is scheduled on:  06/03/11   Surgery 330pm-5pm Report to [facility] at  1PM.  Call this number if you have problems the morning of surgery: [phone]   Remember:   Do not eat food:After Midnight.  Tuesday NIGHT     SNACK BEFORE BED TUES NIGHT  May have clear liquids:.    CLEAR LIQUIDS UNTIL 0900AM Wednesday, THEN NONE  Clear liquids include soda, tea, black coffee, apple or grape juice, broth.  Take these medicines the morning of surgery with A SIP OF WATER:LANOXIN,METOPROLOL, PROLISEC WITH SIP WATER     TRAMADOL OR NITROSTAT IF NEEDED              NO SUGAR MEDICINES AM OF SURGERY           ON Tuesday NIGHT - TAKE HALF DOSAGE OF INSULIN  Do not wear jewelry, make-up or nail polish.  Do not wear lotions, powders, or perfumes. You may wear deodorant.  Do not shave 48 hours prior to surgery.  Do not bring valuables to the hospital.  Contacts, dentures or bridgework may not be worn into surgery.  Leave suitcase in the car. After surgery it may be brought to your room.  For patients admitted to the hospital, checkout time is 11:00 AM the day of discharge.   Patients discharged the day of surgery will not be allowed to drive home.  Name and phone number of your driver: wife Special Instructions: CHG Shower Use Special Wash: 1/2 bottle night before surgery and 1/2 bottle morning of surgery.   REGULAR SOAP FACE AND PRIVATES   Please read over the following fact sheets that you were given: MRSA Information             Late notation  06/05/11               Pt surg rescheduled for 06/23/10 at 3:00pm                Contacted pt by phone..states is starting to feel better, was given Tamaflu by primary MD .               Pt instructed to follow instructions previously given on 06/01/11 and may have clear liquids               12:00am to 9:00am                  Instructed to arrive at Short Stay at 12:30pm on 06/23/10                D [name]  RN

## 2011-06-05 NOTE — Pre-Procedure Instructions (Signed)
Spoke with Dahlia Client from [facility]. Jude informed to date and time of patients surgery. (06/24/11 @ 3:00pm)

## 2011-06-24 ENCOUNTER — Encounter (HOSPITAL_COMMUNITY): Payer: Self-pay | Admitting: Anesthesiology

## 2011-06-24 ENCOUNTER — Ambulatory Visit (HOSPITAL_COMMUNITY): Payer: Medicare Other

## 2011-06-24 ENCOUNTER — Ambulatory Visit (HOSPITAL_COMMUNITY)
Admission: RE | Admit: 2011-06-24 | Discharge: 2011-06-24 | Disposition: A | Discharge status: Home / Self Care | Payer: Medicare Other | Source: Ambulatory Visit | Attending: Urology | Admitting: Urology

## 2011-06-24 ENCOUNTER — Ambulatory Visit (HOSPITAL_COMMUNITY): Payer: Medicare Other | Admitting: Anesthesiology

## 2011-06-24 ENCOUNTER — Encounter (HOSPITAL_COMMUNITY)
Admission: RE | Disposition: A | Discharge status: Home / Self Care | Payer: Self-pay | Source: Ambulatory Visit | Attending: Urology

## 2011-06-24 DIAGNOSIS — R310 Gross hematuria: Secondary | ICD-10-CM | POA: Insufficient documentation

## 2011-06-24 DIAGNOSIS — Z8711 Personal history of peptic ulcer disease: Secondary | ICD-10-CM | POA: Insufficient documentation

## 2011-06-24 DIAGNOSIS — Z01812 Encounter for preprocedural laboratory examination: Secondary | ICD-10-CM | POA: Insufficient documentation

## 2011-06-24 DIAGNOSIS — N134 Hydroureter: Secondary | ICD-10-CM | POA: Insufficient documentation

## 2011-06-24 DIAGNOSIS — Z794 Long term (current) use of insulin: Secondary | ICD-10-CM | POA: Insufficient documentation

## 2011-06-24 DIAGNOSIS — R011 Cardiac murmur, unspecified: Secondary | ICD-10-CM | POA: Insufficient documentation

## 2011-06-24 DIAGNOSIS — K219 Gastro-esophageal reflux disease without esophagitis: Secondary | ICD-10-CM | POA: Insufficient documentation

## 2011-06-24 DIAGNOSIS — E7800 Pure hypercholesterolemia, unspecified: Secondary | ICD-10-CM | POA: Insufficient documentation

## 2011-06-24 DIAGNOSIS — N200 Calculus of kidney: Secondary | ICD-10-CM | POA: Insufficient documentation

## 2011-06-24 DIAGNOSIS — E119 Type 2 diabetes mellitus without complications: Secondary | ICD-10-CM | POA: Insufficient documentation

## 2011-06-24 DIAGNOSIS — Z79899 Other long term (current) drug therapy: Secondary | ICD-10-CM | POA: Insufficient documentation

## 2011-06-24 DIAGNOSIS — G4730 Sleep apnea, unspecified: Secondary | ICD-10-CM | POA: Insufficient documentation

## 2011-06-24 DIAGNOSIS — I2510 Atherosclerotic heart disease of native coronary artery without angina pectoris: Secondary | ICD-10-CM | POA: Insufficient documentation

## 2011-06-24 HISTORY — PX: URETEROSCOPY: SHX842

## 2011-06-24 HISTORY — PX: CYSTOSCOPY W/ RETROGRADES: SHX1426

## 2011-06-24 LAB — GLUCOSE, CAPILLARY
Glucose-Capillary: 125 mg/dL — ABNORMAL HIGH (ref 70–99)
Glucose-Capillary: 107 mg/dL — ABNORMAL HIGH (ref 70–99)

## 2011-06-24 SURGERY — URETEROSCOPY
Anesthesia: Spinal | Site: Ureter | Wound class: Clean Contaminated

## 2011-06-24 MED ORDER — BELLADONNA ALKALOIDS-OPIUM 16.2-60 MG RE SUPP
RECTAL | Status: DC | PRN
  Administered 2011-06-24: 1 via RECTAL

## 2011-06-24 MED ORDER — INSULIN ASPART 100 UNIT/ML ~~LOC~~ SOLN: 0.0000 [IU] | SUBCUTANEOUS | Status: DC

## 2011-06-24 MED ORDER — IOHEXOL 300 MG/ML  SOLN
INTRAMUSCULAR | Status: DC | PRN
  Administered 2011-06-24: 10 mL

## 2011-06-24 MED ORDER — INDIGOTINDISULFONATE SODIUM 8 MG/ML IJ SOLN
INTRAMUSCULAR | Status: DC | PRN
  Administered 2011-06-24: 5 mL via INTRAVENOUS

## 2011-06-24 MED ORDER — MIDAZOLAM HCL 5 MG/5ML IJ SOLN
INTRAMUSCULAR | Status: DC | PRN
  Administered 2011-06-24 (×2): 1 mg via INTRAVENOUS

## 2011-06-24 MED ORDER — CEFAZOLIN SODIUM-DEXTROSE 2-3 GM-% IV SOLR
INTRAVENOUS | Status: AC
  Filled 2011-06-24: qty 50

## 2011-06-24 MED ORDER — BELLADONNA ALKALOIDS-OPIUM 16.2-60 MG RE SUPP
RECTAL | Status: AC
  Filled 2011-06-24: qty 1

## 2011-06-24 MED ORDER — ACETAMINOPHEN 10 MG/ML IV SOLN
INTRAVENOUS | Status: DC | PRN
  Administered 2011-06-24: 1000 mg via INTRAVENOUS

## 2011-06-24 MED ORDER — CEFAZOLIN SODIUM-DEXTROSE 2-3 GM-% IV SOLR
2.0000 g | Freq: Once | INTRAVENOUS | Status: AC
  Administered 2011-06-24: 2 g via INTRAVENOUS

## 2011-06-24 MED ORDER — KETAMINE HCL 10 MG/ML IJ SOLN
INTRAMUSCULAR | Status: DC | PRN
  Administered 2011-06-24 (×4): 10 mg via INTRAVENOUS

## 2011-06-24 MED ORDER — LACTATED RINGERS IV SOLN
INTRAVENOUS | Status: DC
  Administered 2011-06-24: 1000 mL via INTRAVENOUS
  Administered 2011-06-24: 17:00:00 via INTRAVENOUS

## 2011-06-24 MED ORDER — FENTANYL CITRATE 0.05 MG/ML IJ SOLN
INTRAMUSCULAR | Status: DC | PRN
  Administered 2011-06-24: 100 ug via INTRAVENOUS

## 2011-06-24 MED ORDER — HYOSCYAMINE SULFATE 0.125 MG PO TABS: 0.1250 mg | ORAL_TABLET | ORAL | Status: DC | PRN

## 2011-06-24 MED ORDER — FUROSEMIDE 10 MG/ML IJ SOLN
INTRAMUSCULAR | Status: DC | PRN
  Administered 2011-06-24: 10 mg via INTRAMUSCULAR

## 2011-06-24 MED ORDER — SENNOSIDES-DOCUSATE SODIUM 8.6-50 MG PO TABS: 1.0000 | ORAL_TABLET | Freq: Two times a day (BID) | ORAL | Status: DC

## 2011-06-24 MED ORDER — WARFARIN SODIUM 5 MG PO TABS: 5.0000 mg | ORAL_TABLET | ORAL | Status: DC

## 2011-06-24 MED ORDER — FUROSEMIDE 10 MG/ML IJ SOLN
INTRAMUSCULAR | Status: AC
  Filled 2011-06-24: qty 2

## 2011-06-24 MED ORDER — ACETAMINOPHEN 10 MG/ML IV SOLN
INTRAVENOUS | Status: AC
  Filled 2011-06-24: qty 100

## 2011-06-24 MED ORDER — PROPOFOL 10 MG/ML IV EMUL
INTRAVENOUS | Status: DC | PRN
  Administered 2011-06-24: 50 ug/kg/min via INTRAVENOUS

## 2011-06-24 MED ORDER — ENOXAPARIN SODIUM 40 MG/0.4ML ~~LOC~~ SOLN: 40.0000 mg | SUBCUTANEOUS | Status: DC

## 2011-06-24 MED ORDER — HYDROMORPHONE HCL PF 1 MG/ML IJ SOLN: 0.2500 mg | INTRAMUSCULAR | Status: DC | PRN

## 2011-06-24 MED ORDER — BUPIVACAINE IN DEXTROSE 0.75-8.25 % IT SOLN
INTRATHECAL | Status: DC | PRN
  Administered 2011-06-24: 10 mg via INTRATHECAL

## 2011-06-24 MED ORDER — CIPROFLOXACIN HCL 500 MG PO TABS: 500.0000 mg | ORAL_TABLET | Freq: Two times a day (BID) | ORAL | Status: DC

## 2011-06-24 MED ORDER — PROMETHAZINE HCL 25 MG/ML IJ SOLN: 6.2500 mg | INTRAMUSCULAR | Status: DC | PRN

## 2011-06-24 MED ORDER — SODIUM CHLORIDE 0.9 % IR SOLN
Status: DC | PRN
  Administered 2011-06-24: 3000 mL

## 2011-06-24 MED ORDER — OXYCODONE-ACETAMINOPHEN 5-325 MG PO TABS: 1.0000 | ORAL_TABLET | ORAL | Status: DC | PRN

## 2011-06-24 MED ORDER — IOHEXOL 300 MG/ML  SOLN
INTRAMUSCULAR | Status: AC
  Filled 2011-06-24: qty 1

## 2011-06-24 MED ORDER — PHENAZOPYRIDINE HCL 100 MG PO TABS: 100.0000 mg | ORAL_TABLET | Freq: Three times a day (TID) | ORAL | Status: AC | PRN

## 2011-06-24 SURGICAL SUPPLY — 32 items
ADAPTER CATH URET PLST 4-6FR (CATHETERS) ×5 IMPLANT
BAG URINE DRAINAGE (UROLOGICAL SUPPLIES) IMPLANT
BAG URO CATCHER STRL LF (DRAPE) ×5 IMPLANT
BASKET ZERO TIP NITINOL 2.4FR (BASKET) IMPLANT
CATH HEMA 3WAY 30CC 24FR COUDE (CATHETERS) ×5 IMPLANT
CATH INTERMIT  6FR 70CM (CATHETERS) IMPLANT
CATH URET 5FR 28IN OPEN ENDED (CATHETERS) ×5 IMPLANT
CLOTH BEACON ORANGE TIMEOUT ST (SAFETY) ×5 IMPLANT
DRAPE CAMERA CLOSED 9X96 (DRAPES) ×5 IMPLANT
ELECT REM PT RETURN 9FT ADLT (ELECTROSURGICAL)
ELECTRODE REM PT RTRN 9FT ADLT (ELECTROSURGICAL) IMPLANT
EVACUATOR MICROVAS BLADDER (UROLOGICAL SUPPLIES) IMPLANT
GLOVE BIOGEL M STRL SZ7.5 (GLOVE) ×5 IMPLANT
GLOVE BIOGEL PI IND STRL 7.5 (GLOVE) ×8 IMPLANT
GLOVE BIOGEL PI INDICATOR 7.5 (GLOVE) ×2
GLOVE ECLIPSE 7.0 STRL STRAW (GLOVE) ×5 IMPLANT
GLOVE ECLIPSE 7.5 STRL STRAW (GLOVE) ×5 IMPLANT
GLOVE SURG SS PI 8.0 STRL IVOR (GLOVE) ×5 IMPLANT
GOWN PREVENTION PLUS XLARGE (GOWN DISPOSABLE) IMPLANT
GOWN STRL NON-REIN LRG LVL3 (GOWN DISPOSABLE) IMPLANT
GOWN STRL REIN XL XLG (GOWN DISPOSABLE) ×5 IMPLANT
GUIDEWIRE ANG ZIPWIRE 038X150 (WIRE) IMPLANT
GUIDEWIRE STR DUAL SENSOR (WIRE) ×10 IMPLANT
KIT ASPIRATION TUBING (SET/KITS/TRAYS/PACK) IMPLANT
LASER FIBER DISP (UROLOGICAL SUPPLIES) IMPLANT
LOOPS RESECTOSCOPE DISP (ELECTROSURGICAL) ×5 IMPLANT
MANIFOLD NEPTUNE II (INSTRUMENTS) ×5 IMPLANT
MARKER SKIN DUAL TIP RULER LAB (MISCELLANEOUS) ×5 IMPLANT
PACK CYSTO (CUSTOM PROCEDURE TRAY) ×5 IMPLANT
SYR 30ML LL (SYRINGE) ×5 IMPLANT
SYRINGE IRR TOOMEY STRL 70CC (SYRINGE) IMPLANT
TUBING CONNECTING 10 (TUBING) ×5 IMPLANT

## 2011-06-24 NOTE — Anesthesia Postprocedure Evaluation (Signed)
  Anesthesia Post-op Note  Patient: Paul Dalton  Procedure(s) Performed:  URETEROSCOPY; CYSTOSCOPY WITH RETROGRADE PYELOGRAM  Patient Location: PACU  Anesthesia Type: Spinal  Level of Consciousness: oriented and sedated  Airway and Oxygen Therapy: Patient Spontanous Breathing and Patient connected to nasal cannula oxygen  Post-op Pain: none  Post-op Assessment: Post-op Vital signs reviewed, Patient's Cardiovascular Status Stable, Respiratory Function Stable and Patent Airway  Post-op Vital Signs: stable  Complications: No apparent anesthesia complications

## 2011-06-24 NOTE — Anesthesia Procedure Notes (Signed)
Spinal  Patient location during procedure: OR Start time: 06/24/2011 3:24 PM End time: 06/24/2011 3:34 PM Staffing Anesthesiologist: Lestine Box B Performed by: anesthesiologist  Preanesthetic Checklist Completed: patient identified, site marked, surgical consent, pre-op evaluation, timeout performed, IV checked, risks and benefits discussed and monitors and equipment checked Spinal Block Patient position: right lateral decubitus Prep: Betadine Patient monitoring: heart rate, cardiac monitor, continuous pulse ox and blood pressure Approach: right paramedian Location: L3-4 Injection technique: single-shot Needle Needle type: Quincke  Needle gauge: 25 G Needle length: 10 cm Needle insertion depth: 4 cm Additional Notes [id number], 2013-11

## 2011-06-24 NOTE — Transfer of Care (Addendum)
Immediate Anesthesia Transfer of Care Note  Patient: Paul Dalton  Procedure(s) Performed:  URETEROSCOPY; CYSTOSCOPY WITH RETROGRADE PYELOGRAM  Patient Location: PACU  Anesthesia Type: Spinal  Level of Consciousness: awake and patient cooperative  Airway & Oxygen Therapy: Patient Spontanous Breathing and Patient connected to face mask oxygen  Post-op Assessment: Post -op Vital signs reviewed and stable  Post vital signs: Reviewed and stableSAB level T 10  Complications: No apparent anesthesia complications

## 2011-06-24 NOTE — Brief Op Note (Signed)
06/24/2011  4:43 PM  PATIENT:  Paul Dalton  78 y.o. male  PRE-OPERATIVE DIAGNOSIS:  HEMATURIA, LEFT DISTAL URETERAL FILLING DEFECT  POST-OPERATIVE DIAGNOSIS:  gross hematuriat  PROCEDURE:  Procedure(s): Left URETEROSCOPY CYSTOSCOPY  Left RETROGRADE PYELOGRAM  SURGEON:  Surgeon(s): Milford Cage, MD  PHYSICIAN ASSISTANT: None  ASSISTANTS: none   ANESTHESIA:   spinal  EBL:  Total I/O In: 1000 [I.V.:1000] Out: -   BLOOD ADMINISTERED:none  DRAINS: Urinary Catheter (Foley)   LOCAL MEDICATIONS USED:  NONE  SPECIMEN:  No Specimen  DISPOSITION OF SPECIMEN:  N/A  COUNTS:  YES  TOURNIQUET:  * No tourniquets in log *  DICTATION: .Other Dictation: Dictation Number [id number]  PLAN OF CARE: Discharge to home after PACU  PATIENT DISPOSITION:  PACU - hemodynamically stable.   Delay start of Pharmacological VTE agent (>24hrs) due to surgical blood loss or risk of bleeding:  Yes

## 2011-06-24 NOTE — H&P (Signed)
Chief Complaint  Gross hematuria   History of Present Illness    78 year old male with history of gross hematuria. Office cystoscopy revealed a severely trabeculated bladder without any tumors.  Ureteral orifices were difficult to identify due to the trabeculation.  Further workup with CT scan hematuria protocol revealed multiple bilateral renal stones that are stable compared to last year.  There was also mild left hydroureteronephrosis with narrowing at the distal left ureter.  There are not filling defects, but the contrast looks irregular.  I recommended going to the OR for a cystoscopy, possible transrectal resection of bladder tumor, left retrograde pyelogram, left ureteroscopy with biopsy and/or laser ablation, and ureteral stent placement. We have discussed the risks, benefits, alternatives, and likelihood of achieving his goals.  Patient has been cleared by Dr. Sharrell Ku to be off coumadin for 5 days prior to his surgery.    Past Medical History Problems  1. History of  Adult Sleep Apnea 780.57 2. History of  Arthritis V13.4 3. History of  Cardiac Failure 428.9 4. History of  Coronary Artery Disease V12.59 5. History of  Diabetes Mellitus 250.00 6. History of  Esophageal Reflux 530.81 7. History of  Hematospermia 608.82 8. History of  Hypercholesterolemia 272.0 9. History of  Hypertension 401.9 10. History of  Murmurs 785.2 11. History of  Peptic Ulcer V12.71  Surgical History Problems  1. History of  Appendectomy 2. History of  Hemorrhoidectomy 3. History of  Hernia Repair 4. History of  Rotator Cuff Repair  Current Meds 1. Cinnamon 500 MG Oral Capsule; Therapy: (Recorded:27Feb2008) to 2. Digoxin 0.25 MG Oral Tablet; Therapy: (Recorded:28Aug2008) to 3. Diltiazem CD 300 MG CP24; Therapy: (Recorded:27Feb2008) to 4. Doxazosin Mesylate 4 MG Oral Tablet; TAKE 1 TABLET Bedtime; Last Rx:08Mar2011 5. Finasteride 5 MG Oral Tablet; Take 1 tablet daily; Last Rx:08Mar2011 6.  Folic Acid CAPS; Therapy: (Recorded:08Mar2011) to 7. Glucosamine CAPS; Therapy: (Recorded:27Feb2008) to 8. Janumet TABS; Therapy: (Recorded:25Feb2010) to 9. Lantus SOLN; Therapy: (Recorded:08Mar2011) to 10. Lisinopril TABS; Therapy: (Recorded:27Feb2008) to 11. Metoprolol Tartrate 50 MG Oral Tablet; Therapy: (Recorded:28Aug2008) to 12. Nitrostat 0.4 MG Sublingual Tablet Sublingual; Therapy: (Recorded:26Oct2012) to 13. NovoLOG SOLN; Therapy: (Recorded:26Oct2012) to 14. Omeprazole 20 MG Oral Capsule Delayed Release; Therapy: (Recorded:28Aug2008) to 15. Simvastatin 40 MG Oral Tablet; Therapy: (Recorded:08Mar2011) to 16. Tramadol-Acetaminophen 37.5-325 MG Oral Tablet; Therapy: 09Dec2010 to 17. Vitamin B-12 TABS; Therapy: (Recorded:26Oct2012) to 18. Vitamin C TABS; Therapy: (Recorded:28Aug2008) to 19. Vitamin D TABS; Therapy: (Recorded:28Aug2008) to 20. Warfarin Sodium 5 MG Oral Tablet; Therapy: (Recorded:28Aug2008) to  Allergies Medication  1. Imdur TB24 2. Sular TB24  Family History Problems  1. Family history of  Family Health Status Number Of Children 1 son2 daughters  Social History Problems  1. Caffeine Use 4 per day 2. Marital History - Currently Married 3. Occupation: Retired Denied  4. Alcohol Use  Review of Systems Constitutional, skin, eye, endocrine, musculoskeletal, gastrointestinal and psychiatric system(s) were reviewed and pertinent findings if present are noted.  Integumentary: pruritus, but no new skin rashes or lesions.  ENT: sinus problems, but no sore throat.  Hematologic/Lymphatic: a tendency to easily bruise, but no swollen glands.  Cardiovascular: chest pain and leg swelling.  Respiratory: shortness of breath, positive cough.  Musculoskeletal: back pain and joint pain.  Neurological: headache and dizziness.     Physical Exam Constitutional: Well nourished and well developed.  ENT:. The ears and nose are normal in appearance. The oropharynx is normal.   Neck: The appearance of the neck is normal  and no neck mass is present.  Pulmonary: No respiratory distress and normal respiratory rhythm and effort.  Cardiovascular: Heart rate and rhythm are normal.  Abdomen: The abdomen is soft and nontender. No masses are palpated. The abdomen is no rebound. No CVA tenderness.  Lymphatics: The posterior cervical and supraclavicular nodes are not enlarged or tender.  Skin: Normal skin turgor and no visible rash.  Neuro/Psych:. Mood and affect are appropriate. No focal sensory deficits.      Assessment Left ureteral defect Gross Hematuria   Plan To the OR for cystoscopy, left retrograde palate gram, left ureteroscopy with biopsy and laser tumor ablation, left ureteral stent placement, possible transurethral resection of bladder tumor.

## 2011-06-24 NOTE — Anesthesia Preprocedure Evaluation (Signed)
Anesthesia Evaluation  Patient identified by MRN, date of birth, ID band Patient awake  General Assessment Comment:Increased CV risk  Reviewed: Allergy & Precautions, H&P , NPO status , Patient's Chart, lab work & pertinent test results, reviewed documented beta blocker date and time   Airway Mallampati: II TM Distance: >3 FB Neck ROM: Full    Dental  (+) Partial Upper   Pulmonary neg pulmonary ROS, sleep apnea ,  OSA, non-compliant w/ CPAP clear to auscultation        Cardiovascular hypertension, Pt. on medications +CHF + dysrhythmias Atrial Fibrillation Regular Normal SSS, pacemaker A Fib, off coumadin, Lovenox bridge, no lovenox today   Neuro/Psych Negative Neurological ROS  Negative Psych ROS   GI/Hepatic negative GI ROS, Neg liver ROS,   Endo/Other  Diabetes mellitus-, Well Controlled, Type 1, Insulin DependentNo AM insulin  Renal/GU Ureteral obstruction-left   Bladder lesion    Musculoskeletal negative musculoskeletal ROS (+)   Abdominal   Peds negative pediatric ROS (+)  Hematology negative hematology ROS (+)   Anesthesia Other Findings   Reproductive/Obstetrics negative OB ROS                           Anesthesia Physical Anesthesia Plan  ASA: III  Anesthesia Plan: Spinal   Post-op Pain Management:    Induction:   Airway Management Planned: Mask  Additional Equipment:   Intra-op Plan:   Post-operative Plan:   Informed Consent: I have reviewed the patients History and Physical, chart, labs and discussed the procedure including the risks, benefits and alternatives for the proposed anesthesia with the patient or authorized representative who has indicated his/her understanding and acceptance.     Plan Discussed with: CRNA and Surgeon  Anesthesia Plan Comments:         Anesthesia Quick Evaluation

## 2011-06-24 NOTE — Preoperative (Signed)
Beta Blockers   Reason not to administer Beta Blockers:Took Metoprolol 100 mg at 0700 today.

## 2011-06-25 ENCOUNTER — Emergency Department (HOSPITAL_COMMUNITY): Payer: Medicare Other

## 2011-06-25 ENCOUNTER — Encounter (HOSPITAL_COMMUNITY): Payer: Self-pay | Admitting: Emergency Medicine

## 2011-06-25 ENCOUNTER — Emergency Department (HOSPITAL_COMMUNITY)
Admission: EM | Admit: 2011-06-25 | Discharge: 2011-06-25 | Disposition: A | Discharge status: Home / Self Care | Payer: Medicare Other | Attending: Emergency Medicine | Admitting: Emergency Medicine

## 2011-06-25 DIAGNOSIS — I10 Essential (primary) hypertension: Secondary | ICD-10-CM | POA: Insufficient documentation

## 2011-06-25 DIAGNOSIS — I252 Old myocardial infarction: Secondary | ICD-10-CM | POA: Insufficient documentation

## 2011-06-25 DIAGNOSIS — R10819 Abdominal tenderness, unspecified site: Secondary | ICD-10-CM | POA: Insufficient documentation

## 2011-06-25 DIAGNOSIS — N3289 Other specified disorders of bladder: Secondary | ICD-10-CM | POA: Insufficient documentation

## 2011-06-25 DIAGNOSIS — I2510 Atherosclerotic heart disease of native coronary artery without angina pectoris: Secondary | ICD-10-CM | POA: Insufficient documentation

## 2011-06-25 DIAGNOSIS — Z794 Long term (current) use of insulin: Secondary | ICD-10-CM | POA: Insufficient documentation

## 2011-06-25 DIAGNOSIS — M549 Dorsalgia, unspecified: Secondary | ICD-10-CM | POA: Insufficient documentation

## 2011-06-25 DIAGNOSIS — E119 Type 2 diabetes mellitus without complications: Secondary | ICD-10-CM | POA: Insufficient documentation

## 2011-06-25 DIAGNOSIS — N200 Calculus of kidney: Secondary | ICD-10-CM | POA: Insufficient documentation

## 2011-06-25 DIAGNOSIS — Z9889 Other specified postprocedural states: Secondary | ICD-10-CM | POA: Insufficient documentation

## 2011-06-25 LAB — CBC
Hemoglobin: 12.8 g/dL — ABNORMAL LOW (ref 13.0–17.0)
MCHC: 35 g/dL (ref 30.0–36.0)
RDW: 13.5 % (ref 11.5–15.5)
WBC: 14.6 10*3/uL — ABNORMAL HIGH (ref 4.0–10.5)

## 2011-06-25 LAB — GLUCOSE, CAPILLARY

## 2011-06-25 LAB — URINALYSIS, ROUTINE W REFLEX MICROSCOPIC
Bilirubin Urine: NEGATIVE
Specific Gravity, Urine: 1.016 (ref 1.005–1.030)
Urobilinogen, UA: 0.2 mg/dL (ref 0.0–1.0)

## 2011-06-25 LAB — POCT I-STAT, CHEM 8
HCT: 39 % (ref 39.0–52.0)
Hemoglobin: 13.3 g/dL (ref 13.0–17.0)
Potassium: 3.5 mEq/L (ref 3.5–5.1)
Sodium: 136 mEq/L (ref 135–145)

## 2011-06-25 LAB — URINE CULTURE
Culture  Setup Time: 201301101117
Culture: NO GROWTH

## 2011-06-25 LAB — URINE MICROSCOPIC-ADD ON

## 2011-06-25 MED ORDER — IOHEXOL 300 MG/ML  SOLN
100.0000 mL | Freq: Once | INTRAMUSCULAR | Status: AC | PRN
  Administered 2011-06-25: 100 mL via INTRAVENOUS

## 2011-06-25 MED ORDER — MORPHINE SULFATE 4 MG/ML IJ SOLN
4.0000 mg | Freq: Once | INTRAMUSCULAR | Status: AC
  Administered 2011-06-25: 4 mg via INTRAVENOUS
  Filled 2011-06-25: qty 1

## 2011-06-25 MED ORDER — SODIUM CHLORIDE 0.9 % IV SOLN
Freq: Once | INTRAVENOUS | Status: AC
  Administered 2011-06-25: 06:00:00 via INTRAVENOUS

## 2011-06-25 NOTE — ED Provider Notes (Addendum)
8:18 AM I received care of the patient from Dr. Hyacinth Meeker, to followup on his CT scan results and address any concerns that arise with urology. The patient is currently pain-free, awake, alert, and comfortable. CBG was checked out of the patient's daughter's concern because he is diabetic, and at this time it does not indicate any acute need for insulin.  Felisa Bonier, MD 06/25/11 0818  Ct Abdomen Pelvis W Contrast  06/25/2011  *RADIOLOGY REPORT*  Clinical Data: Status post bladder surgery 07/04/2011 for gross hematuria.  Now back pain, left flank pain, and left abdominal pain.  History kidney stones.  Elevated white blood cell count. Previous hernia repair and appendectomy.  CT ABDOMEN AND PELVIS WITH CONTRAST  Technique:  Multidetector CT imaging of the abdomen and pelvis was performed following the standard protocol during bolus administration of intravenous contrast.  Contrast:  100  ml Omnipaque-300  Comparison: 04/10/2011  Findings: Mild scarring or atelectasis at the left lung base. Moderate cardiomegaly.  Pacer / AICD device. No pericardial or pleural effusion.  Old granulomatous disease in the liver.  Old granulomatous disease in the spleen.  Normal stomach.  Mild pancreatic atrophy.  The gallbladder is mildly distended.  No specific evidence of acute cholecystitis and no evidence of gallstones.  Right-sided renal calculi are again identified.  Upper and interpolar left renal cysts are again identified.  Development of moderate left-sided hydroureteronephrosis and perirenal edema. Air within the proximal left ureter on image 35 is favored to be iatrogenic.  There is moderate to left hydroureter throughout. Small amount of hyperattenuating material within the dependent left ureter distally on image 71 could represent contrast or small amount of hemorrhage.  Mild non aneurysmal dilatation of the infrarenal abdominal aorta. No retroperitoneal or retrocrural adenopathy.  Normal colon and terminal ileum.   Normal small bowel without abdominal ascites.    No pelvic adenopathy.  There is a Foley catheter within urinary bladder.  The bladder appears thick-walled, possibly due to underdistension.  Complex fluid within could represent a small amount of hematoma.  Example images 75 and 76. No significant free fluid.  No acute osseous abnormality.  Mild Schmorl's node deformity at T11.  IMPRESSION:  1.  Development moderate left-sided hydroureteronephrosis.  No obstructive stone identified.  Possible small amount of hemorrhage in the distal left ureter.  The obstruction is likely secondary to small amount of complex material/hemorrhage within the left side of the urinary bladder. 2.  Bilateral renal calculi. 3.  Gallbladder distention, without specific evidence of cholecystitis.  Consider correlation with right upper quadrant symptoms and possibly ultrasound.  Original Report Authenticated By: Consuello Bossier, M.D.   Imaging study reviewed by me.  No RUQ pain or tenderness to suggest acute cholelithiasis/cholecystitis.  The imaging study with it's evident left hydroureter/hemorrhage explains the patient's symptoms.  I will discuss the patient's case and results with the patient's urologist, Dr. Margarita Grizzle.  Felisa Bonier, MD 06/25/11 7846  Felisa Bonier, MD 06/25/11 [phone]  9:56 AM The patient's case and test results including the CT scan were reviewed with Dr. Margarita Grizzle, and he has personally evaluated the CT scan and comments that the hydroureteronephrosis is no worse than the chronic changes that the patient usually manifests. With the patient's pain controlled, renal function normal, urine now clear and flowing freely, he has asked that the ER nurse. Gait the patient's Foley to flush any further clot from the bladder but at this time the patient is to followup with Dr. Margarita Grizzle  in his office as scheduled tomorrow, and to hold off on restarting his Lovenox and Coumadin today until he is seen in the office  tomorrow. I reviewed all the findings and the plan with the patient and his family who state their understanding of and agreement with the plan of care. The patient will be discharged home.  Felisa Bonier, MD 06/25/11 [phone]

## 2011-06-25 NOTE — ED Notes (Signed)
Pt was sent home after a bladder biopsy at 1900 this evening, his catheter was not draining and he started to have left flank pain, pt states that he has some drainage around the catheter and the pain is becoming more severe

## 2011-06-25 NOTE — Op Note (Signed)
[name], [name] NO.:  [id number]  MEDICAL RECORD NO.:  [id number]  LOCATION:  [facility]                         FACILITY:  [facility]  PHYSICIAN:  Natalia Leatherwood, MD    DATE OF BIRTH:  14-Jul-1929  DATE OF PROCEDURE:  06/24/2011 DATE OF DISCHARGE:  06/24/2011                              OPERATIVE REPORT   SURGEON:  Natalia Leatherwood, MD.  ASSISTANT:  None.  PREOPERATIVE DIAGNOSES:  Gross hematuria and left hydroureter.  POSTOPERATIVE DIAGNOSES:  Gross hematuria and left hydroureter.  PROCEDURES PERFORMED:  Cystoscopy, panendoscopy, left retrograde pyelogram, left diagnostic ureteroscopy.  ESTIMATED BLOOD LOSS:  10 mL.  COMPLICATIONS:  None.  SPECIMEN:  None.  FINDINGS:  Left dilated ureter which appears to be widely patent and no ureteral tumors.  DRAINS:  Foley catheter.  HISTORY OF PRESENT ILLNESS:  A 78 year old gentleman presented to my clinic with gross hematuria.  Cystoscopy revealed a trabeculated bladder and a CT hematuria protocol revealed left hydronephroureterosis.  His left distal ureter was not filling and in fact was concerning for a filling defect.  I recommend we go to the operating room for left retrograde pyelogram, possible left ureteroscopy.  The patient agreed and present for the procedure today.  PROCEDURE IN DETAIL:  After informed consent was obtained, the patient was taken to the operating room, where he was placed in supine position. IV antibiotics were infused and general anesthesia was induced.  He was then placed in a dorsal lithotomy position.  Make sure to pad all pertinent neurovascular pressure points appropriately.  Following this, a time-out was performed in which the correct patient, surgical site, and procedure were identified and agreed upon by the team.  Following this, a 12 degree cystoscope was advanced through the urethra into the bladder.  It was noted that his prostatic urethra was very inflamed. Any  manipulation of the camera on the bladder resulted in bleeding from the bladder neck.  Systematic evaluation of the bladder was carried out with a 12 degree and 70 degree cystoscope.  There were no lesions. There were multiple trabeculations and diverticula.  All of these were assessed and no tumors were present.  The right ureteral orifice was readily available.  The left ureteral orifice was difficult to locate and therefore the patient was given indigo carmine, followed by 10 mg of Lasix.  Eventually, the left ureteral orifice was identified underneath behind a large hypertrophic piece of detrusor muscle, it did note to be obstructed at all.  It was easily cannulated with 2 sensor tip wires. Retrograde pyelogram was obtained by cannulating with a 5-French open- ended [facility] catheter.  There was a large amount of hydroureter and hydronephrosis, but it emptied out well and did not appear to be obstructed.  Next, a semi-rigid ureteroscope was placed up over the wire and taken up to the ureteropelvic junction.  I had a limited view of the renal pelvis and there was no tumors and then the wire was removed and the entire ureter was visualized.  There were no strictured areas, scarred areas or tumors, or even erythema in the ureter.  This completed the procedure. His bladder was drained and a 22-French  hematuria catheter was placed with 30 cc sterile water in the balloon.  This was placed due to large amount of bleeding from his prostatic urethra.  B and O suppository was placed into his rectum and his bladder was irrigated out until it was clear.  The patient will follow up this Friday for evaluation of urine and possible voiding trial.          ______________________________ Natalia Leatherwood, MD     DW/MEDQ  D:  06/24/2011  T:  06/25/2011  Job:  [id number]

## 2011-06-25 NOTE — ED Notes (Signed)
Catheter irrigated with 60 cc NS --clear return, no clots noted.

## 2011-06-25 NOTE — ED Notes (Signed)
Per ems, that patient was here today and had a biopsy of his bladder and left and went home and started having pain at 1900 and still has pain after multiple pain pills at home.

## 2011-06-25 NOTE — ED Provider Notes (Signed)
History     CSN: [id number]  Arrival date & time 06/25/11  0416   First MD Initiated Contact with Patient 06/25/11 0518      Chief Complaint  Patient presents with  . Post-op Problem  . Back Pain    (Consider location/radiation/quality/duration/timing/severity/associated sxs/prior treatment) HPI Comments: 78 year old male with a history of hematuria, status post cystoscopy with bladder biopsy earlier yesterday. He had a Foley catheter placed postop and noticed that he was not able to get any fluid out through the catheter at home for the first several hours. The urine gradually began to flow but over the last several hours she has developed a severe left lower quadrant and left-sided pain. He denies nausea or vomiting but states that the pain is severe, constant, worse with touching the stomach. The Foley catheter has been draining bloody fluid but has also been draining around the catheter at the tip of the penis.  Patient is a 78 y.o. male presenting with back pain. The history is provided by the patient, a relative and medical records.  Back Pain     Past Medical History  Diagnosis Date  . Angina   . CHF (congestive heart failure)   . Coronary artery disease   . Heart murmur   . Hypertension     hyperlipidemia  . Shortness of breath     with exertion  . Recurrent upper respiratory infection (URI)     statrted with cold symptoms 05/30/11- large amount clear drainage out of nose, with cough- non productive- states chest hurts from coughing so much.  ?fever last PM  . Sleep apnea     last study years ago- SEVERS PER PATIENT- does not wear mask  . Diabetes mellitus   . Chronic kidney disease   . Renal lithiasis     states in both kidneys, hematuria  . H/O hiatal hernia   . Headache     history of migraines  . Stroke     3 mini strokes-last one 1 1/2 yrs ago  . Cancer     skin cancer left ear  . Arthritis   . 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 Dysrhythmia     atrial fibrilliation,s/p PPM,bradycardia s/p PPM, hx tachy-brady syndrome. LAST OV WITH INTERROGATION ON CHART. Clearance Dr Ladona Ridgel with ok to stop coumadin 5 days pre op on chart  . Myocardial infarction     states silent heart attack years ago, but no damage-- stress test 2009 on chart  . Hematuria     Past Surgical History  Procedure Date  . Insert / replace / remove pacemaker     [employer] Jude- last interrogation 7/12- followed by Dr Ladona Ridgel  . Cardiac catheterization     x 2 -years ago  . Hemorrhoid surgery   . Eye surgery     bilateral cataract extraction with IOL  . Appendectomy   . Hernia repair     bilateral inguinal  . Rotator cuff repair     right  . Doppler echocardiography 2009    History reviewed. No pertinent family history.  History  Substance Use Topics  . Smoking status: Former Smoker -- 30 years    Types: Cigarettes, Pipe, Cigars    Quit date: 06/01/1971  . Smokeless tobacco: Never Used  . Alcohol Use: No      Review of Systems  Musculoskeletal: Positive for back pain.  All other systems reviewed and are negative.    Allergies  Nisoldipine and Isosorbide mononitrate  Home Medications   Current Outpatient Rx  Name Route Sig Dispense Refill  . CIPROFLOXACIN HCL 500 MG PO TABS Oral Take 1 tablet (500 mg total) by mouth 2 (two) times daily. 6 tablet 0  . B-12 DOTS SL Sublingual Place 1 tablet under the tongue daily.     Marland Kitchen DIGOXIN 0.25 MG PO TABS Oral Take 250-500 mcg by mouth 2 (two) times daily. PT TAKE 2 TABLETS ON Monday, Wednesday, AND Friday.  ALL OTHER DAYS PT TAKE 1 TABLET WHEN TAKEN ONCE A DAY PT TAKES THIS MEDICATION IN THE MORNING.    . DILTIAZEM HCL ER COATED BEADS 360 MG PO CP24 Oral Take 360 mg by mouth at bedtime.     Marland Kitchen DOXAZOSIN MESYLATE 4 MG PO TABS Oral Take 2 mg by mouth at bedtime.     Marland Kitchen ENOXAPARIN SODIUM 40 MG/0.4ML [place] SOLN Subcutaneous Inject 0.4 mLs (40 mg  total) into the skin daily. 11.2 mL 0  . FINASTERIDE 5 MG PO TABS Oral Take 5 mg by mouth daily.     Marland Kitchen FOLIC ACID 400 MCG PO TABS Oral Take 400 mcg by mouth daily.     Marland Kitchen GLUCOSAMINE-CHONDROITIN 500-400 MG PO TABS Oral Take 1 tablet by mouth 2 (two) times daily.     Marland Kitchen HYOSCYAMINE SULFATE 0.125 MG PO TABS Oral Take 1 tablet (0.125 mg total) by mouth every 4 (four) hours as needed for cramping (bladder spasms). 40 tablet 4  . INSULIN ASPART 100 UNIT/ML [place] SOLN Subcutaneous Inject 8 Units into the skin every evening.     . INSULIN GLARGINE 100 UNIT/ML  SOLN Subcutaneous Inject 35 Units into the skin every morning.     Marland Kitchen LISINOPRIL 40 MG PO TABS Oral Take 40 mg by mouth every morning.     Marland Kitchen METOPROLOL TARTRATE 100 MG PO TABS Oral Take 100 mg by mouth 2 (two) times daily.     Marland Kitchen NITROGLYCERIN 0.4 MG SL SUBL Sublingual Place 0.4 mg under the tongue every 5 (five) minutes as needed. CHEST PAIN     . OMEPRAZOLE 20 MG PO CPDR Oral Take 20 mg by mouth 2 (two) times daily.     . OXYCODONE-ACETAMINOPHEN 5-325 MG PO TABS Oral Take 1-2 tablets by mouth every 4 (four) hours as needed for pain. 60 tablet 0  . PHENAZOPYRIDINE HCL 100 MG PO TABS Oral Take 1 tablet (100 mg total) by mouth every 8 (eight) hours as needed for pain (Burning urination.  Will turn urine and body fluids orange.). 30 tablet 1  . SENNOSIDES-DOCUSATE SODIUM 8.6-50 MG PO TABS Oral Take 1 tablet by mouth 2 (two) times daily. 60 tablet 0  . SIMVASTATIN 10 MG PO TABS Oral Take 10 mg by mouth at bedtime.     Marland Kitchen SITAGLIPTIN-METFORMIN HCL 50-500 MG PO TABS Oral Take 1 tablet by mouth 2 (two) times daily with a meal.     . VITAMIN C 500 MG PO TABS Oral Take 500 mg by mouth daily.     . WARFARIN SODIUM 5 MG PO TABS Oral Take 1-1.5 tablets (5-7.5 mg total) by mouth every morning. 1 TABLET EVERYDAY BUT A 1.5 TABLET ON Friday. 1 tablet 0    BP 170/90  Pulse 75  Temp(Src) 97.8 F (36.6 C) (Oral)  Resp 18  SpO2 99%  Physical Exam  Nursing note  and vitals reviewed. Constitutional: He appears well-developed  and well-nourished. No distress.  HENT:  Head: Normocephalic and atraumatic.  Mouth/Throat: Oropharynx is clear and moist. No oropharyngeal exudate.  Eyes: Conjunctivae and EOM are normal. Pupils are equal, round, and reactive to light. Right eye exhibits no discharge. Left eye exhibits no discharge. No scleral icterus.  Neck: Normal range of motion. Neck supple. No JVD present. No thyromegaly present.  Cardiovascular: Normal rate, regular rhythm, normal heart sounds and intact distal pulses.  Exam reveals no gallop and no friction rub.   No murmur heard. Pulmonary/Chest: Effort normal and breath sounds normal. No respiratory distress. He has no wheezes. He has no rales.  Abdominal: Soft. Bowel sounds are normal. He exhibits no distension and no mass. There is tenderness ( Suprapubic tenderness, left lower cautery and had left-sided tenderness. Non-peritoneal and no right-sided tenderness the).  Musculoskeletal: Normal range of motion. He exhibits no edema and no tenderness.  Lymphadenopathy:    He has no cervical adenopathy.  Neurological: He is alert. Coordination normal.  Skin: Skin is warm and dry. No rash noted. No erythema.  Psychiatric: He has a normal mood and affect. His behavior is normal.    ED Course  Procedures (including critical care time)   Labs Reviewed  URINALYSIS, ROUTINE W REFLEX MICROSCOPIC  URINE CULTURE  I-STAT, CHEM 8  CBC   Dg Chest 2 View  06/24/2011  *RADIOLOGY REPORT*  Clinical Data: Preoperative assessment for bladder biopsy, history of diabetes, hypertension, recent pulmonary infiltrates 06/01/2011.  CHEST - 2 VIEW  Comparison: 06/01/2011  Findings: Right subclavian sequential transveous pacemaker leads project at right atrium and right ventricle. Enlargement of cardiac silhouette. Tortuous aorta with atherosclerotic calcification at arch. Minimal pulmonary vascular congestion. Lungs appear  emphysematous but clear. Previously seen right basilar opacity resolved. No pleural effusion or pneumothorax. Question coronary arterial calcifications.  IMPRESSION: Enlargement of cardiac silhouette post pacemaker. Question coronary arterial calcifications. Emphysematous changes with resolution of previously identified right basilar opacity. No acute abnormalities.  Original Report Authenticated By: Lollie Marrow, M.D.     No diagnosis found.    MDM  Possible urinary retention, hematuria with possible blood clots in the Foley catheter tubing. Urinalysis with culture, blood counts, renal function, bladder scan to evaluate amount of urine and bladder.  Pt feeling much better at this time.  CT pending,  Change of shift - care signed out to Dr. Luiz Blare, MD 06/25/11 [phone]

## 2011-06-29 ENCOUNTER — Encounter (HOSPITAL_COMMUNITY): Payer: Self-pay | Admitting: Urology

## 2011-06-30 ENCOUNTER — Encounter: Payer: Self-pay | Admitting: Internal Medicine

## 2011-06-30 ENCOUNTER — Ambulatory Visit (INDEPENDENT_AMBULATORY_CARE_PROVIDER_SITE_OTHER): Payer: Medicare Other | Admitting: Internal Medicine

## 2011-06-30 DIAGNOSIS — Z950 Presence of cardiac pacemaker: Secondary | ICD-10-CM

## 2011-06-30 DIAGNOSIS — I5032 Chronic diastolic (congestive) heart failure: Secondary | ICD-10-CM

## 2011-06-30 DIAGNOSIS — I4891 Unspecified atrial fibrillation: Secondary | ICD-10-CM

## 2011-06-30 DIAGNOSIS — E785 Hyperlipidemia, unspecified: Secondary | ICD-10-CM

## 2011-06-30 DIAGNOSIS — I509 Heart failure, unspecified: Secondary | ICD-10-CM

## 2011-06-30 LAB — PACEMAKER DEVICE OBSERVATION
BATTERY VOLTAGE: 2.78 V
BMOD-0002RV: 12
RV LEAD AMPLITUDE: 12 mv
RV LEAD IMPEDENCE PM: 603 Ohm
RV LEAD THRESHOLD: 0.5 V
VENTRICULAR PACING PM: 99

## 2011-06-30 NOTE — Assessment & Plan Note (Signed)
His device is working normally. Plan to recheck in several months. 

## 2011-06-30 NOTE — Patient Instructions (Signed)
Your physician wants you to follow-up in: 6 months in device clinic and 12 months with Dr Taylor You will receive a reminder letter in the mail two months in advance. If you don't receive a letter, please call our office to schedule the follow-up appointment.  

## 2011-06-30 NOTE — Progress Notes (Signed)
HPI Paul Dalton returns today for followup. He is a very pleasant 78 year old man with complete heart block, hypertension, paroxysmal atrial fibrillation, and dyslipidemia. The patient has done well in the interim. He denies chest pain, shortness of breath, or syncope. He does have chronic peripheral edema. He has not been wearing his support stockings because he has undergone a urologic procedure and has soreness making it difficult to bend over.  Allergies  Allergen Reactions  . Nisoldipine Nausea And Vomiting  . Isosorbide Mononitrate Rash     Current Outpatient Prescriptions  Medication Sig Dispense Refill  . Cinnamon 500 MG capsule Take 500 mg by mouth daily.      . Cyanocobalamin (B-12 DOTS SL) Place 1 tablet under the tongue daily.       . digoxin (LANOXIN) 0.25 MG tablet Take 250-500 mcg by mouth 2 (two) times daily. PT TAKE 2 TABLETS ON Monday, Wednesday, AND Friday.  ALL OTHER DAYS PT TAKE 1 TABLET WHEN TAKEN ONCE A DAY PT TAKES THIS MEDICATION IN THE MORNING.      Marland Kitchen diltiazem (CARDIZEM CD) 360 MG 24 hr capsule Take 360 mg by mouth at bedtime.       Marland Kitchen doxazosin (CARDURA) 4 MG tablet Take 2 mg by mouth at bedtime.       . enoxaparin (LOVENOX) 40 MG/0.4ML SOLN Inject 0.4 mLs (40 mg total) into the skin daily.  11.2 mL  0  . finasteride (PROSCAR) 5 MG tablet Take 5 mg by mouth daily.       . folic acid (FOLVITE) 400 MCG tablet Take 400 mcg by mouth daily.       Marland Kitchen glucosamine-chondroitin 500-400 MG tablet Take 1 tablet by mouth 2 (two) times daily.       . insulin aspart (NOVOLOG) 100 UNIT/ML injection Inject 12 Units into the skin every evening.       . insulin glargine (LANTUS) 100 UNIT/ML injection Inject 35 Units into the skin every morning.       Marland Kitchen lisinopril (PRINIVIL,ZESTRIL) 40 MG tablet Take 40 mg by mouth every morning.       . metoprolol (LOPRESSOR) 100 MG tablet Take 100 mg by mouth 2 (two) times daily.       . Misc Natural Products (APPLE CIDER VINEGAR) TABS Take 2 tablets  by mouth daily.      . nitroGLYCERIN (NITROSTAT) 0.4 MG SL tablet Place 0.4 mg under the tongue every 5 (five) minutes as needed. CHEST PAIN       . omeprazole (PRILOSEC) 20 MG capsule Take 20 mg by mouth 2 (two) times daily.       . simvastatin (ZOCOR) 10 MG tablet Take 10 mg by mouth at bedtime.       . sitaGLIPtan-metformin (JANUMET) 50-500 MG per tablet Take 1 tablet by mouth 2 (two) times daily with a meal.       . traMADol-acetaminophen (ULTRACET) 37.5-325 MG per tablet Take 1 tablet by mouth every 6 (six) hours as needed.      . vitamin C (ASCORBIC ACID) 500 MG tablet Take 500 mg by mouth daily.       . vitamin E 400 UNIT capsule Take 400 Units by mouth daily.      Marland Kitchen warfarin (COUMADIN) 5 MG tablet Take 1-1.5 tablets (5-7.5 mg total) by mouth every morning. 1 TABLET EVERYDAY BUT A 1.5 TABLET ON Friday.  1 tablet  0     Past Medical History  Diagnosis Date  . Angina   .  CHF (congestive heart failure)   . Coronary artery disease   . Heart murmur   . Hypertension     hyperlipidemia  . Shortness of breath     with exertion  . Recurrent upper respiratory infection (URI)     statrted with cold symptoms 05/30/11- large amount clear drainage out of nose, with cough- non productive- states chest hurts from coughing so much.  ?fever last PM  . Sleep apnea     last study years ago- SEVERS PER PATIENT- does not wear mask  . Diabetes mellitus   . Chronic kidney disease   . Renal lithiasis     states in both kidneys, hematuria  . H/O hiatal hernia   . Headache     history of migraines  . Stroke     3 mini strokes-last one 1 1/2 yrs ago  . Cancer     skin cancer left ear  . Arthritis   . 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 Dysrhythmia     atrial fibrilliation,s/p PPM,bradycardia s/p PPM, hx tachy-brady syndrome. LAST OV WITH INTERROGATION ON CHART. Clearance Dr Ladona Ridgel with ok to stop coumadin 5 days  pre op on chart  . Myocardial infarction     states silent heart attack years ago, but no damage-- stress test 2009 on chart  . Hematuria     ROS:   All systems reviewed and negative except as noted in the HPI.   Past Surgical History  Procedure Date  . Insert / replace / remove pacemaker     St Jude- last interrogation 7/12- followed by Dr Ladona Ridgel  . Cardiac catheterization     x 2 -years ago  . Hemorrhoid surgery   . Eye surgery     bilateral cataract extraction with IOL  . Appendectomy   . Hernia repair     bilateral inguinal  . Rotator cuff repair     right  . Doppler echocardiography 2009  . Ureteroscopy 06/24/2011    Procedure: URETEROSCOPY;  Surgeon: Milford Cage, MD;  Location: [facility];  Service: Urology;  Laterality: Left;  . Cystoscopy w/ retrogrades 06/24/2011    Procedure: CYSTOSCOPY WITH RETROGRADE PYELOGRAM;  Surgeon: Milford Cage, MD;  Location: [facility];  Service: Urology;  Laterality: Left;     History reviewed. No pertinent family history.   History   Social History  . Marital Status: Married    Spouse Name: N/A    Number of Children: N/A  . Years of Education: N/A   Occupational History  . Not on file.   Social History Main Topics  . Smoking status: Former Smoker -- 30 years    Types: Cigarettes, Pipe, Cigars    Quit date: 06/01/1971  . Smokeless tobacco: Never Used  . Alcohol Use: No  . Drug Use: No  . Sexually Active:    Other Topics Concern  . Not on file   Social History Narrative  . No narrative on file     BP 102/62  Pulse 88  Ht 6\' 5"  (1.956 m)  Wt 106.142 kg (234 lb)  BMI 27.75 kg/m2  Physical Exam:  Well appearing elderly man, NAD HEENT: Unremarkable Neck:  No JVD, no thyromegally Lungs:  Clear with no wheezes, rales or rhonchi. HEART:  Regular rate rhythm, no murmurs, no rubs, no clicks Abd:  soft, positive bowel sounds, no organomegally, no rebound, no guarding  Ext:  2 plus pulses, no edema, no  cyanosis, no clubbing Skin:  No rashes no nodules Neuro:  CN II through XII intact, motor grossly intact  DEVICE  Normal device function.  See PaceArt for details.   Assess/Plan:

## 2011-06-30 NOTE — Assessment & Plan Note (Signed)
He will continue his statin therapy and maintain a low fat diet.  

## 2011-06-30 NOTE — Assessment & Plan Note (Signed)
He is maintaining euvolemia.  I discussed the importance of a low-sodium diet. He will keep his legs elevated and is instructed to start wearing his support stockings. He will continue his current medical therapy.

## 2011-12-28 ENCOUNTER — Ambulatory Visit (INDEPENDENT_AMBULATORY_CARE_PROVIDER_SITE_OTHER): Payer: Medicare Other | Admitting: *Deleted

## 2011-12-28 ENCOUNTER — Encounter: Payer: Self-pay | Admitting: Internal Medicine

## 2011-12-28 DIAGNOSIS — I495 Sick sinus syndrome: Secondary | ICD-10-CM

## 2011-12-28 DIAGNOSIS — I4891 Unspecified atrial fibrillation: Secondary | ICD-10-CM

## 2011-12-28 LAB — PACEMAKER DEVICE OBSERVATION
BATTERY VOLTAGE: 2.78 V
BRDY-0004RV: 120 {beats}/min
DEVICE MODEL PM: 2041208
RV LEAD THRESHOLD: 0.5 V

## 2011-12-28 NOTE — Progress Notes (Signed)
PPM check 

## 2012-06-30 ENCOUNTER — Ambulatory Visit (INDEPENDENT_AMBULATORY_CARE_PROVIDER_SITE_OTHER): Payer: Medicare Other | Admitting: Internal Medicine

## 2012-06-30 ENCOUNTER — Encounter: Payer: Self-pay | Admitting: Internal Medicine

## 2012-06-30 VITALS — BP 130/77 | HR 75 | Ht 77.0 in | Wt 236.0 lb

## 2012-06-30 DIAGNOSIS — Z950 Presence of cardiac pacemaker: Secondary | ICD-10-CM

## 2012-06-30 DIAGNOSIS — I4891 Unspecified atrial fibrillation: Secondary | ICD-10-CM

## 2012-06-30 DIAGNOSIS — I495 Sick sinus syndrome: Secondary | ICD-10-CM

## 2012-06-30 DIAGNOSIS — R55 Syncope and collapse: Secondary | ICD-10-CM

## 2012-06-30 DIAGNOSIS — I5032 Chronic diastolic (congestive) heart failure: Secondary | ICD-10-CM

## 2012-06-30 LAB — PACEMAKER DEVICE OBSERVATION
BATTERY VOLTAGE: 2.78 V
DEVICE MODEL PM: 2041208
VENTRICULAR PACING PM: 99

## 2012-06-30 NOTE — Patient Instructions (Signed)
Your physician wants you to follow-up in: 6 months with device clinic and 12 months with Dr Taylor You will receive a reminder letter in the mail two months in advance. If you don't receive a letter, please call our office to schedule the follow-up appointment.  

## 2012-06-30 NOTE — Assessment & Plan Note (Signed)
His congestive heart failure symptoms remain class II. He'll continue his current medical therapy, and maintain a low-sodium diet. I've also encouraged the patient to continue his current level of physical activity.

## 2012-06-30 NOTE — Assessment & Plan Note (Signed)
The patient is maintaining atrial fibrillation. His ventricular rate is well controlled. He will continue chronic anticoagulation.

## 2012-06-30 NOTE — Assessment & Plan Note (Signed)
His [employer]. Jude single chamber pacemaker is working normally. We'll plan to recheck in several months. 

## 2012-06-30 NOTE — Progress Notes (Signed)
HPI [name] returns today for followup. He is a very pleasant 79 year old man with symptomatic bradycardia, complete heart block, atrial fibrillation, status post permanent pacemaker insertion. The patient has done well in the interim. He denies chest pain, shortness of breath, or peripheral edema. He remains active, volunteering at his church. Allergies  Allergen Reactions  . Nisoldipine Nausea And Vomiting  . Isosorbide Mononitrate Rash     Current Outpatient Prescriptions  Medication Sig Dispense Refill  . cholecalciferol (VITAMIN D) 400 UNITS TABS Take by mouth.      . Cinnamon 500 MG capsule Take 500 mg by mouth daily.      . Cyanocobalamin (B-12 DOTS SL) Place 1 tablet under the tongue daily.       . digoxin (LANOXIN) 0.25 MG tablet Take 250-500 mcg by mouth 2 (two) times daily. PT TAKE 2 TABLETS ON Monday, Wednesday, AND Friday.  ALL OTHER DAYS PT TAKE 1 TABLET WHEN TAKEN ONCE A DAY PT TAKES THIS MEDICATION IN THE MORNING.      Marland Kitchen diltiazem (CARDIZEM CD) 360 MG 24 hr capsule Take 360 mg by mouth at bedtime.       Marland Kitchen doxazosin (CARDURA) 4 MG tablet Take 2 mg by mouth at bedtime.       . enoxaparin (LOVENOX) 40 MG/0.4ML SOLN Inject 0.4 mLs (40 mg total) into the skin daily.  11.2 mL  0  . finasteride (PROSCAR) 5 MG tablet Take 5 mg by mouth daily.       . folic acid (FOLVITE) 400 MCG tablet Take 400 mcg by mouth daily.       Marland Kitchen glucosamine-chondroitin 500-400 MG tablet Take 1 tablet by mouth 2 (two) times daily.       . insulin aspart (NOVOLOG) 100 UNIT/ML injection Inject 8 Units into the skin every evening.       . insulin glargine (LANTUS) 100 UNIT/ML injection Inject 35 Units into the skin every morning.       Marland Kitchen lisinopril (PRINIVIL,ZESTRIL) 40 MG tablet Take 40 mg by mouth every morning.       . metoprolol (LOPRESSOR) 100 MG tablet Take 100 mg by mouth 2 (two) times daily.       . Misc Natural Products (APPLE CIDER VINEGAR) TABS Take 2 tablets by mouth daily.      . nitroGLYCERIN  (NITROSTAT) 0.4 MG SL tablet Place 0.4 mg under the tongue every 5 (five) minutes as needed. CHEST PAIN       . omeprazole (PRILOSEC) 20 MG capsule Take 20 mg by mouth 2 (two) times daily.       . simvastatin (ZOCOR) 10 MG tablet Take 40 mg by mouth at bedtime.       . sitaGLIPtan-metformin (JANUMET) 50-500 MG per tablet Take 1 tablet by mouth 2 (two) times daily with a meal.       . traMADol-acetaminophen (ULTRACET) 37.5-325 MG per tablet Take 1 tablet by mouth every 6 (six) hours as needed.      . vitamin C (ASCORBIC ACID) 500 MG tablet Take 500 mg by mouth daily.       . vitamin E 400 UNIT capsule Take 400 Units by mouth daily.      Marland Kitchen warfarin (COUMADIN) 5 MG tablet Take 1-1.5 tablets (5-7.5 mg total) by mouth every morning. 1 TABLET EVERYDAY BUT A 1.5 TABLET ON Friday.  1 tablet  0     Past Medical History  Diagnosis Date  . Angina   . CHF (congestive  heart failure)   . Coronary artery disease   . Heart murmur   . Hypertension     hyperlipidemia  . Shortness of breath     with exertion  . Recurrent upper respiratory infection (URI)     statrted with cold symptoms 05/30/11- large amount clear drainage out of nose, with cough- non productive- states chest hurts from coughing so much.  ?fever last PM  . Sleep apnea     last study years ago- SEVERS PER PATIENT- does not wear mask  . Diabetes mellitus   . Chronic kidney disease   . Renal lithiasis     states in both kidneys, hematuria  . H/O hiatal hernia   . Headache     history of migraines  . Stroke     3 mini strokes-last one 1 1/2 yrs ago  . Cancer     skin cancer left ear  . Arthritis   . 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 Dysrhythmia     atrial fibrilliation,s/p PPM,bradycardia s/p PPM, hx tachy-brady syndrome. LAST OV WITH INTERROGATION ON CHART. Clearance Dr Ladona Ridgel with ok to stop coumadin 5 days pre op on chart  . Myocardial  infarction     states silent heart attack years ago, but no damage-- stress test 2009 on chart  . Hematuria     ROS:   All systems reviewed and negative except as noted in the HPI.   Past Surgical History  Procedure Date  . Insert / replace / remove pacemaker     St Jude- last interrogation 7/12- followed by Dr Ladona Ridgel  . Cardiac catheterization     x 2 -years ago  . Hemorrhoid surgery   . Eye surgery     bilateral cataract extraction with IOL  . Appendectomy   . Hernia repair     bilateral inguinal  . Rotator cuff repair     right  . Doppler echocardiography 2009  . Ureteroscopy 06/24/2011    Procedure: URETEROSCOPY;  Surgeon: Paul Cage, MD;  Location: [facility];  Service: Urology;  Laterality: Left;  . Cystoscopy w/ retrogrades 06/24/2011    Procedure: CYSTOSCOPY WITH RETROGRADE PYELOGRAM;  Surgeon: Paul Cage, MD;  Location: [facility];  Service: Urology;  Laterality: Left;     No family history on file.   History   Social History  . Marital Status: Married    Spouse Name: N/A    Number of Children: N/A  . Years of Education: N/A   Occupational History  . Not on file.   Social History Main Topics  . Smoking status: Former Smoker -- 30 years    Types: Cigarettes, Pipe, Cigars    Quit date: 06/01/1971  . Smokeless tobacco: Never Used  . Alcohol Use: No  . Drug Use: No  . Sexually Active:    Other Topics Concern  . Not on file   Social History Narrative  . No narrative on file     BP 130/77  Pulse 75  Ht 6\' 5"  (1.956 m)  Wt 236 lb (107.049 kg)  BMI 27.99 kg/m2  Physical Exam:  Well appearing elderly man, NAD HEENT: Unremarkable Neck:  7 cm JVD, no thyromegally Lungs:  Clear with no wheezes, rales, or rhonchi. HEART:  Regular rate rhythm, no murmurs, no rubs, no clicks Abd:  soft, positive bowel sounds, no organomegally, no rebound, no guarding Ext:  2 plus pulses, no edema, no cyanosis, no clubbing Skin:  No rashes no  nodules Neuro:  CN II through XII intact, motor grossly intact   DEVICE  Normal device function.  See PaceArt for details.   Assess/Plan:

## 2012-07-14 ENCOUNTER — Encounter: Payer: Self-pay | Admitting: Internal Medicine

## 2012-09-21 ENCOUNTER — Emergency Department (HOSPITAL_COMMUNITY): Payer: Medicare Other

## 2012-09-21 ENCOUNTER — Emergency Department (HOSPITAL_COMMUNITY)
Admission: EM | Admit: 2012-09-21 | Discharge: 2012-09-22 | Disposition: A | Discharge status: Home / Self Care | Payer: Medicare Other | Attending: Emergency Medicine | Admitting: Emergency Medicine

## 2012-09-21 ENCOUNTER — Encounter (HOSPITAL_COMMUNITY): Payer: Self-pay | Admitting: Emergency Medicine

## 2012-09-21 DIAGNOSIS — Z8679 Personal history of other diseases of the circulatory system: Secondary | ICD-10-CM | POA: Insufficient documentation

## 2012-09-21 DIAGNOSIS — I129 Hypertensive chronic kidney disease with stage 1 through stage 4 chronic kidney disease, or unspecified chronic kidney disease: Secondary | ICD-10-CM | POA: Insufficient documentation

## 2012-09-21 DIAGNOSIS — G4730 Sleep apnea, unspecified: Secondary | ICD-10-CM | POA: Insufficient documentation

## 2012-09-21 DIAGNOSIS — Z8739 Personal history of other diseases of the musculoskeletal system and connective tissue: Secondary | ICD-10-CM | POA: Insufficient documentation

## 2012-09-21 DIAGNOSIS — Z9861 Coronary angioplasty status: Secondary | ICD-10-CM | POA: Insufficient documentation

## 2012-09-21 DIAGNOSIS — Z8669 Personal history of other diseases of the nervous system and sense organs: Secondary | ICD-10-CM | POA: Insufficient documentation

## 2012-09-21 DIAGNOSIS — I509 Heart failure, unspecified: Secondary | ICD-10-CM | POA: Insufficient documentation

## 2012-09-21 DIAGNOSIS — Z8719 Personal history of other diseases of the digestive system: Secondary | ICD-10-CM | POA: Insufficient documentation

## 2012-09-21 DIAGNOSIS — I2510 Atherosclerotic heart disease of native coronary artery without angina pectoris: Secondary | ICD-10-CM | POA: Insufficient documentation

## 2012-09-21 DIAGNOSIS — R011 Cardiac murmur, unspecified: Secondary | ICD-10-CM | POA: Insufficient documentation

## 2012-09-21 DIAGNOSIS — Z79899 Other long term (current) drug therapy: Secondary | ICD-10-CM | POA: Insufficient documentation

## 2012-09-21 DIAGNOSIS — Z87448 Personal history of other diseases of urinary system: Secondary | ICD-10-CM | POA: Insufficient documentation

## 2012-09-21 DIAGNOSIS — M25422 Effusion, left elbow: Secondary | ICD-10-CM

## 2012-09-21 DIAGNOSIS — Z794 Long term (current) use of insulin: Secondary | ICD-10-CM | POA: Insufficient documentation

## 2012-09-21 DIAGNOSIS — Z950 Presence of cardiac pacemaker: Secondary | ICD-10-CM | POA: Insufficient documentation

## 2012-09-21 DIAGNOSIS — E119 Type 2 diabetes mellitus without complications: Secondary | ICD-10-CM | POA: Insufficient documentation

## 2012-09-21 DIAGNOSIS — Z8709 Personal history of other diseases of the respiratory system: Secondary | ICD-10-CM | POA: Insufficient documentation

## 2012-09-21 DIAGNOSIS — R079 Chest pain, unspecified: Secondary | ICD-10-CM | POA: Insufficient documentation

## 2012-09-21 DIAGNOSIS — Z87891 Personal history of nicotine dependence: Secondary | ICD-10-CM | POA: Insufficient documentation

## 2012-09-21 DIAGNOSIS — Z85828 Personal history of other malignant neoplasm of skin: Secondary | ICD-10-CM | POA: Insufficient documentation

## 2012-09-21 DIAGNOSIS — I252 Old myocardial infarction: Secondary | ICD-10-CM | POA: Insufficient documentation

## 2012-09-21 DIAGNOSIS — Z8673 Personal history of transient ischemic attack (TIA), and cerebral infarction without residual deficits: Secondary | ICD-10-CM | POA: Insufficient documentation

## 2012-09-21 DIAGNOSIS — M25429 Effusion, unspecified elbow: Secondary | ICD-10-CM | POA: Insufficient documentation

## 2012-09-21 DIAGNOSIS — Z7901 Long term (current) use of anticoagulants: Secondary | ICD-10-CM | POA: Insufficient documentation

## 2012-09-21 DIAGNOSIS — N189 Chronic kidney disease, unspecified: Secondary | ICD-10-CM | POA: Insufficient documentation

## 2012-09-21 DIAGNOSIS — Z87442 Personal history of urinary calculi: Secondary | ICD-10-CM | POA: Insufficient documentation

## 2012-09-21 LAB — CBC WITH DIFFERENTIAL/PLATELET
Basophils Absolute: 0 10*3/uL (ref 0.0–0.1)
Basophils Relative: 0 % (ref 0–1)
HCT: 35.3 % — ABNORMAL LOW (ref 39.0–52.0)
MCHC: 35.4 g/dL (ref 30.0–36.0)
Monocytes Absolute: 1.1 10*3/uL — ABNORMAL HIGH (ref 0.1–1.0)
Neutro Abs: 10.7 10*3/uL — ABNORMAL HIGH (ref 1.7–7.7)
Platelets: 187 10*3/uL (ref 150–400)
RDW: 14.4 % (ref 11.5–15.5)
WBC: 12.6 10*3/uL — ABNORMAL HIGH (ref 4.0–10.5)

## 2012-09-21 LAB — COMPREHENSIVE METABOLIC PANEL
ALT: 17 U/L (ref 0–53)
AST: 16 U/L (ref 0–37)
Albumin: 3.9 g/dL (ref 3.5–5.2)
Calcium: 9.3 mg/dL (ref 8.4–10.5)
Chloride: 102 mEq/L (ref 96–112)
Creatinine, Ser: 0.95 mg/dL (ref 0.50–1.35)
Sodium: 139 mEq/L (ref 135–145)
Total Bilirubin: 0.5 mg/dL (ref 0.3–1.2)

## 2012-09-21 LAB — PRO B NATRIURETIC PEPTIDE: Pro B Natriuretic peptide (BNP): 922.3 pg/mL — ABNORMAL HIGH (ref 0–450)

## 2012-09-21 LAB — POCT I-STAT TROPONIN I: Troponin i, poc: 0 ng/mL (ref 0.00–0.08)

## 2012-09-21 LAB — PROTIME-INR: INR: 1.96 — ABNORMAL HIGH (ref 0.00–1.49)

## 2012-09-21 MED ORDER — ASPIRIN 81 MG PO CHEW
CHEWABLE_TABLET | ORAL | Status: AC
  Filled 2012-09-21: qty 4

## 2012-09-21 MED ORDER — ASPIRIN 81 MG PO CHEW
324.0000 mg | CHEWABLE_TABLET | Freq: Once | ORAL | Status: AC
  Administered 2012-09-21: 324 mg via ORAL

## 2012-09-21 MED ORDER — OXYCODONE-ACETAMINOPHEN 5-325 MG PO TABS
2.0000 | ORAL_TABLET | Freq: Once | ORAL | Status: AC
  Administered 2012-09-21: 2 via ORAL
  Filled 2012-09-21: qty 2

## 2012-09-21 NOTE — ED Notes (Signed)
PT. REPORTS LEFT CHEST PAIN RADIATING TO LEFT ARM AND LEFT SCAPULAR AREA WITH SOB /NAUSEA AND DIAPHORESIS ONSET THIS EVENING . STATES HISTORY OF CAD / PACEMAKER.

## 2012-09-21 NOTE — ED Provider Notes (Signed)
History     CSN: [id number]  Arrival date & time 09/21/12  2156   First MD Initiated Contact with Patient 09/21/12 2313      Chief Complaint  Patient presents with  . Arm Pain    (Consider location/radiation/quality/duration/timing/severity/associated sxs/prior treatment) The history is provided by the patient.  Paul Dalton is a 79 y.o. male history of CHF, left elbow fracture, CAD, hypertension, diabetes here presenting with left arm pain. He had labs drawn for INR checked yesterday from the left arm. Since yesterday he noticed left elbow pain that radiated down his left arm. Denies any arm injury. He said it hurts as bad as when he had the elbow fracture. He had mild left-sided chest pain not associated with left arm pain and an episode of shortness of breath that resolved. Does have a history of CAD and pacemaker but he said this did not feel like pain prior to his pacemaker.   Past Medical History  Diagnosis Date  . Angina   . CHF (congestive heart failure)   . Coronary artery disease   . Heart murmur   . Hypertension     hyperlipidemia  . Shortness of breath     with exertion  . Recurrent upper respiratory infection (URI)     statrted with cold symptoms 05/30/11- large amount clear drainage out of nose, with cough- non productive- states chest hurts from coughing so much.  ?fever last PM  . Sleep apnea     last study years ago- SEVERS PER PATIENT- does not wear mask  . Diabetes mellitus   . Chronic kidney disease   . Renal lithiasis     states in both kidneys, hematuria  . H/O hiatal hernia   . Headache     history of migraines  . Stroke     3 mini strokes-last one 1 1/2 yrs ago  . Cancer     skin cancer left ear  . Arthritis   . 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 Dysrhythmia     atrial fibrilliation,s/p PPM,bradycardia s/p PPM, hx tachy-brady syndrome. LAST OV WITH INTERROGATION  ON CHART. Clearance Dr Ladona Ridgel with ok to stop coumadin 5 days pre op on chart  . Myocardial infarction     states silent heart attack years ago, but no damage-- stress test 2009 on chart  . Hematuria     Past Surgical History  Procedure Laterality Date  . Insert / replace / remove pacemaker      St Jude- last interrogation 7/12- followed by Dr Ladona Ridgel  . Cardiac catheterization      x 2 -years ago  . Hemorrhoid surgery    . Eye surgery      bilateral cataract extraction with IOL  . Appendectomy    . Hernia repair      bilateral inguinal  . Rotator cuff repair      right  . Doppler echocardiography  2009  . Ureteroscopy  06/24/2011    Procedure: URETEROSCOPY;  Surgeon: Milford Cage, MD;  Location: [facility];  Service: Urology;  Laterality: Left;  . Cystoscopy w/ retrogrades  06/24/2011    Procedure: CYSTOSCOPY WITH RETROGRADE PYELOGRAM;  Surgeon: Milford Cage, MD;  Location: [facility];  Service: Urology;  Laterality: Left;    No family history on file.  History  Substance Use Topics  . Smoking status: Former Smoker -- 30 years  Types: Cigarettes, Pipe, Cigars    Quit date: 06/01/1971  . Smokeless tobacco: Never Used  . Alcohol Use: No      Review of Systems  Cardiovascular: Positive for chest pain.  Musculoskeletal:       L arm pain   All other systems reviewed and are negative.    Allergies  Nisoldipine; Isosorbide mononitrate; and Sulfa antibiotics  Home Medications   Current Outpatient Rx  Name  Route  Sig  Dispense  Refill  . cholecalciferol (VITAMIN D) 400 UNITS TABS   Oral   Take 400 Units by mouth every evening.          . Cinnamon 500 MG capsule   Oral   Take 500 mg by mouth 2 (two) times daily.          . digoxin (LANOXIN) 0.25 MG tablet   Oral   Take 0.25-0.5 mg by mouth daily. Takes 1 tablet daily in the morning Takes a second dose at night on Monday Wednesday and Friday         . diltiazem (CARDIZEM CD) 360 MG 24 hr  capsule   Oral   Take 360 mg by mouth at bedtime.          Marland Kitchen doxazosin (CARDURA) 4 MG tablet   Oral   Take 2 mg by mouth at bedtime.          . finasteride (PROSCAR) 5 MG tablet   Oral   Take 5 mg by mouth daily.          . folic acid (FOLVITE) 400 MCG tablet   Oral   Take 400 mcg by mouth every evening.          Marland Kitchen glucosamine-chondroitin 500-400 MG tablet   Oral   Take 1 tablet by mouth 2 (two) times daily.          . insulin aspart (NOVOLOG) 100 UNIT/ML injection   Subcutaneous   Inject 8 Units into the skin every evening.          . insulin glargine (LANTUS) 100 UNIT/ML injection   Subcutaneous   Inject 45 Units into the skin daily.          Marland Kitchen lisinopril (PRINIVIL,ZESTRIL) 40 MG tablet   Oral   Take 40 mg by mouth every morning.          . metoprolol (LOPRESSOR) 100 MG tablet   Oral   Take 100 mg by mouth 2 (two) times daily.          . Misc Natural Products (APPLE CIDER VINEGAR) TABS   Oral   Take 1 tablet by mouth 2 (two) times daily.          Marland Kitchen omeprazole (PRILOSEC) 20 MG capsule   Oral   Take 20 mg by mouth 2 (two) times daily.          Marland Kitchen oxyCODONE-acetaminophen (PERCOCET/ROXICET) 5-325 MG per tablet   Oral   Take 1 tablet by mouth every 4 (four) hours as needed for pain.         . simvastatin (ZOCOR) 20 MG tablet   Oral   Take 10 mg by mouth every evening.         . sitaGLIPtan-metformin (JANUMET) 50-500 MG per tablet   Oral   Take 1 tablet by mouth 2 (two) times daily with a meal.          . vitamin B-12 (CYANOCOBALAMIN) 1000 MCG tablet   Oral  Take 1,000 mcg by mouth every evening.         . vitamin C (ASCORBIC ACID) 500 MG tablet   Oral   Take 500 mg by mouth every evening.          . warfarin (COUMADIN) 5 MG tablet   Oral   Take 2.5-5 mg by mouth daily. Takes 1 tablet daily except on Tuesday then takes half tablet         . nitroGLYCERIN (NITROSTAT) 0.4 MG SL tablet   Sublingual   Place 0.4 mg under  the tongue every 5 (five) minutes as needed. CHEST PAIN            BP 147/75  Pulse 75  Temp(Src) 97.9 F (36.6 C) (Oral)  Resp 21  SpO2 97%  Physical Exam  Nursing note and vitals reviewed. Constitutional: He is oriented to person, place, and time. He appears well-developed and well-nourished.  Uncomfortable, holding L arm   HENT:  Head: Normocephalic.  Mouth/Throat: Oropharynx is clear and moist.  Eyes: Conjunctivae are normal. Pupils are equal, round, and reactive to light.  Neck: Normal range of motion. Neck supple.  Cardiovascular: Normal rate, regular rhythm and normal heart sounds.   Pulmonary/Chest: Effort normal and breath sounds normal. No respiratory distress. He has no wheezes. He has no rales.  Abdominal: Soft. Bowel sounds are normal. He exhibits no distension. There is no tenderness. There is no rebound and no guarding.  Musculoskeletal: He exhibits no edema and no tenderness.  L elbow with dec ROM, + tenderness on elbow   Neurological: He is alert and oriented to person, place, and time.  Skin: Skin is warm and dry.  Psychiatric: He has a normal mood and affect. His behavior is normal. Judgment and thought content normal.    ED Course  Procedures (including critical care time)  Labs Reviewed  PRO B NATRIURETIC PEPTIDE - Abnormal; Notable for the following:    Pro B Natriuretic peptide (BNP) 922.3 (*)    All other components within normal limits  CBC WITH DIFFERENTIAL - Abnormal; Notable for the following:    WBC 12.6 (*)    RBC 3.81 (*)    Hemoglobin 12.5 (*)    HCT 35.3 (*)    Neutrophils Relative 85 (*)    Neutro Abs 10.7 (*)    Lymphocytes Relative 6 (*)    Monocytes Absolute 1.1 (*)    All other components within normal limits  COMPREHENSIVE METABOLIC PANEL - Abnormal; Notable for the following:    Glucose, Bld 250 (*)    GFR calc non Af Amer 77 (*)    GFR calc Af Amer 89 (*)    All other components within normal limits  PROTIME-INR -  Abnormal; Notable for the following:    Prothrombin Time 21.6 (*)    INR 1.96 (*)    All other components within normal limits  DIGOXIN LEVEL  POCT I-STAT TROPONIN I  POCT I-STAT TROPONIN I   Dg Chest 2 View  09/21/2012  *RADIOLOGY REPORT*  Clinical Data: Shortness of breath.  Nausea.  Weakness.  Left arm pain.  CHEST - 2 VIEW  Comparison: Two-view chest 06/24/2011.  Findings: The heart is mildly enlarged.  There is no edema or effusion to suggest failure.  Pacing wires are in place.  Chronic interstitial coarsening is stable.  Mild exaggeration of the thoracic kyphosis is stable.  IMPRESSION:  1.  No acute abnormality or significant interval change. 2.  Stable chronic  interstitial coarsening. 3.  Borderline cardiomegaly without failure.   Original Report Authenticated By: Marin Roberts, M.D.    Dg Elbow Complete Left  09/21/2012  *RADIOLOGY REPORT*  Clinical Data: Left elbow pain for 2 days.  LEFT ELBOW - COMPLETE 3+ VIEW  Comparison: None.  Findings: A large elbow joint effusion is noted.  This raises suspicion for an underlying occult fracture, though the fracture line is not definitely seen, and though evaluation for fracture is somewhat suboptimal due to degenerative change about the distal humerus and radial head.  No additional soft tissue abnormalities are seen.  A bone island is noted at the distal humerus.  IMPRESSION: Large elbow joint effusion noted.  This raises suspicion for underlying occult fracture, though no definite fracture line is seen, and though evaluation for fracture is somewhat suboptimal due to degenerative change about the distal humerus and radial head.   Original Report Authenticated By: Tonia Ghent, M.D.      No diagnosis found.   Date: 09/21/2012  Rate: 75  Rhythm: V paced  QRS Axis: normal  Intervals: normal  ST/T Wave abnormalities: nonspecific ST changes  Conduction Disutrbances:paced  Narrative Interpretation:   Old EKG Reviewed:  unchanged     MDM  Paul Dalton is a 79 y.o. male here with chest pain, L elbow pain. I think elbow pain likely from arthritis vs fracture. Will get xrays. Chest pain atypical and he has cardiology f/u. Will get trop x 2.   2:21 AM Xray showed large effusion, possible occult fracture. Patient splinted and placed in a sling. Trop neg x 2. BNP obtained since he has hx of CHF. BNP 900, unclear baseline. No other signs of CHF. Patient will f/u with orthopedic doctor.        Richardean Canal, MD 09/22/12 [phone]

## 2012-09-22 LAB — POCT I-STAT TROPONIN I: Troponin i, poc: 0.01 ng/mL (ref 0.00–0.08)

## 2012-09-22 LAB — DIGOXIN LEVEL: Digoxin Level: 1 ng/mL (ref 0.8–2.0)

## 2012-09-22 MED ORDER — OXYCODONE-ACETAMINOPHEN 5-325 MG PO TABS: 2.0000 | ORAL_TABLET | ORAL | Status: DC | PRN

## 2012-09-22 MED ORDER — MORPHINE SULFATE 4 MG/ML IJ SOLN
4.0000 mg | Freq: Once | INTRAMUSCULAR | Status: AC
  Administered 2012-09-22: 4 mg via INTRAMUSCULAR
  Filled 2012-09-22: qty 1

## 2012-09-22 NOTE — ED Notes (Addendum)
Charted in error.

## 2012-09-22 NOTE — Progress Notes (Signed)
Orthopedic Tech Progress Note Patient Details:  Paul Dalton 07-27-1929 [id number]  Ortho Devices Type of Ortho Device: Arm sling;Post (long arm) splint   Haskell Flirt 09/22/2012, 12:36 AM

## 2012-12-28 ENCOUNTER — Ambulatory Visit (INDEPENDENT_AMBULATORY_CARE_PROVIDER_SITE_OTHER): Payer: Medicare Other | Admitting: *Deleted

## 2012-12-28 ENCOUNTER — Encounter: Payer: Self-pay | Admitting: Internal Medicine

## 2012-12-28 DIAGNOSIS — I4891 Unspecified atrial fibrillation: Secondary | ICD-10-CM

## 2012-12-28 LAB — PACEMAKER DEVICE OBSERVATION
BATTERY VOLTAGE: 2.78 V
BRDY-0002RV: 75 {beats}/min
RV LEAD IMPEDENCE PM: 558 Ohm
RV LEAD THRESHOLD: 0.5 V

## 2012-12-28 NOTE — Progress Notes (Signed)
PPM check in office. 

## 2013-05-26 ENCOUNTER — Encounter: Payer: Self-pay | Admitting: Internal Medicine

## 2013-05-26 ENCOUNTER — Emergency Department (HOSPITAL_COMMUNITY): Payer: Medicare Other

## 2013-05-26 ENCOUNTER — Encounter (HOSPITAL_COMMUNITY): Payer: Self-pay | Admitting: Emergency Medicine

## 2013-05-26 ENCOUNTER — Emergency Department (HOSPITAL_COMMUNITY)
Admission: EM | Admit: 2013-05-26 | Discharge: 2013-05-26 | Disposition: A | Discharge status: Home / Self Care | Payer: Medicare Other | Attending: Emergency Medicine | Admitting: Emergency Medicine

## 2013-05-26 DIAGNOSIS — Z87891 Personal history of nicotine dependence: Secondary | ICD-10-CM | POA: Insufficient documentation

## 2013-05-26 DIAGNOSIS — E119 Type 2 diabetes mellitus without complications: Secondary | ICD-10-CM | POA: Insufficient documentation

## 2013-05-26 DIAGNOSIS — Y92009 Unspecified place in unspecified non-institutional (private) residence as the place of occurrence of the external cause: Secondary | ICD-10-CM | POA: Insufficient documentation

## 2013-05-26 DIAGNOSIS — Z79899 Other long term (current) drug therapy: Secondary | ICD-10-CM | POA: Insufficient documentation

## 2013-05-26 DIAGNOSIS — R55 Syncope and collapse: Secondary | ICD-10-CM

## 2013-05-26 DIAGNOSIS — R296 Repeated falls: Secondary | ICD-10-CM | POA: Insufficient documentation

## 2013-05-26 DIAGNOSIS — G709 Myoneural disorder, unspecified: Secondary | ICD-10-CM | POA: Insufficient documentation

## 2013-05-26 DIAGNOSIS — I129 Hypertensive chronic kidney disease with stage 1 through stage 4 chronic kidney disease, or unspecified chronic kidney disease: Secondary | ICD-10-CM | POA: Insufficient documentation

## 2013-05-26 DIAGNOSIS — M129 Arthropathy, unspecified: Secondary | ICD-10-CM | POA: Insufficient documentation

## 2013-05-26 DIAGNOSIS — Z794 Long term (current) use of insulin: Secondary | ICD-10-CM | POA: Insufficient documentation

## 2013-05-26 DIAGNOSIS — I509 Heart failure, unspecified: Secondary | ICD-10-CM | POA: Insufficient documentation

## 2013-05-26 DIAGNOSIS — S0003XA Contusion of scalp, initial encounter: Secondary | ICD-10-CM | POA: Insufficient documentation

## 2013-05-26 DIAGNOSIS — Y9389 Activity, other specified: Secondary | ICD-10-CM | POA: Insufficient documentation

## 2013-05-26 DIAGNOSIS — W19XXXA Unspecified fall, initial encounter: Secondary | ICD-10-CM

## 2013-05-26 DIAGNOSIS — N189 Chronic kidney disease, unspecified: Secondary | ICD-10-CM | POA: Insufficient documentation

## 2013-05-26 DIAGNOSIS — Z7901 Long term (current) use of anticoagulants: Secondary | ICD-10-CM | POA: Insufficient documentation

## 2013-05-26 DIAGNOSIS — I2510 Atherosclerotic heart disease of native coronary artery without angina pectoris: Secondary | ICD-10-CM | POA: Insufficient documentation

## 2013-05-26 DIAGNOSIS — Z950 Presence of cardiac pacemaker: Secondary | ICD-10-CM | POA: Insufficient documentation

## 2013-05-26 HISTORY — DX: Syncope and collapse: R55

## 2013-05-26 LAB — CBC WITH DIFFERENTIAL/PLATELET
Basophils Relative: 0 % (ref 0–1)
Eosinophils Relative: 1 % (ref 0–5)
HCT: 35.9 % — ABNORMAL LOW (ref 39.0–52.0)
Hemoglobin: 12.4 g/dL — ABNORMAL LOW (ref 13.0–17.0)
Lymphocytes Relative: 14 % (ref 12–46)
MCHC: 34.5 g/dL (ref 30.0–36.0)
MCV: 97 fL (ref 78.0–100.0)
Monocytes Absolute: 0.8 10*3/uL (ref 0.1–1.0)
Monocytes Relative: 10 % (ref 3–12)
Neutro Abs: 6.4 10*3/uL (ref 1.7–7.7)
Platelets: 192 10*3/uL (ref 150–400)
WBC: 8.5 10*3/uL (ref 4.0–10.5)

## 2013-05-26 LAB — PROTIME-INR
INR: 1.99 — ABNORMAL HIGH (ref 0.00–1.49)
Prothrombin Time: 22 seconds — ABNORMAL HIGH (ref 11.6–15.2)

## 2013-05-26 LAB — POCT I-STAT TROPONIN I

## 2013-05-26 LAB — BASIC METABOLIC PANEL
BUN: 25 mg/dL — ABNORMAL HIGH (ref 6–23)
CO2: 27 mEq/L (ref 19–32)
Calcium: 9.4 mg/dL (ref 8.4–10.5)
Chloride: 103 mEq/L (ref 96–112)
Creatinine, Ser: 1.08 mg/dL (ref 0.50–1.35)
GFR calc Af Amer: 72 mL/min — ABNORMAL LOW (ref >90)
Potassium: 4.1 mEq/L (ref 3.5–5.1)

## 2013-05-26 MED ORDER — ACETAMINOPHEN 325 MG PO TABS
650.0000 mg | ORAL_TABLET | Freq: Once | ORAL | Status: AC
  Administered 2013-05-26: 650 mg via ORAL
  Filled 2013-05-26: qty 2

## 2013-05-26 NOTE — ED Notes (Signed)
PA at bedside.

## 2013-05-26 NOTE — ED Provider Notes (Signed)
Patient seen/examined in the Emergency Department in conjunction with Midlevel Provider Allyne Gee Patient reports head injury s/p fall Exam : awake/alert, he has mild scalp tenderness as well as cervical spine tenderness Plan: cervical collar ordered, as well as imaging and labs His pacemaker was interrogated as well   Joya Gaskins, MD 05/26/13 1836

## 2013-05-26 NOTE — ED Provider Notes (Signed)
Medical screening examination/treatment/procedure(s) were conducted as a shared visit with non-physician practitioner(s) and myself.  I personally evaluated the patient during the encounter.  EKG Interpretation    Date/Time:  Friday May 26 2013 17:36:06 EST Ventricular Rate:  75 PR Interval:  195 QRS Duration: 169 QT Interval:  428 QTC Calculation: 478 R Axis:   -76 Text Interpretation:  Ventricular-paced rhythm No significant change since last tracing Confirmed by Bebe Shaggy  MD, Erlean Mealor [phone]) on 05/26/2013 5:45:55 PM             Pt stable in the ED, no distress, he is feeling improved Imaging/labs/pacemaker evaluations unremarkable Stable for d/c home  Joya Gaskins, MD 05/26/13 2006

## 2013-05-26 NOTE — ED Notes (Signed)
Patient's O2 sat after ambulating in hallway is 93% on room air.

## 2013-05-26 NOTE — ED Notes (Signed)
Family at bedside. 

## 2013-05-26 NOTE — ED Notes (Signed)
Pt tripped and fell while lifting something into back of truck today; pt with increased confusion since event that is resolving; pt alert and oriented at present but disoriented to event of fall; pt on coumadin and hematoma noted to back of head

## 2013-05-26 NOTE — ED Notes (Signed)
Patient currently ambulating in hall with nurse tech.

## 2013-05-26 NOTE — ED Notes (Signed)
Ambulated patient up and down the hall with minor assistance

## 2013-05-26 NOTE — ED Provider Notes (Signed)
CSN: [id number]     Arrival date & time 05/26/13  1653 History   First MD Initiated Contact with Patient 05/26/13 1706     Chief Complaint  Patient presents with  . Fall   (Consider location/radiation/quality/duration/timing/severity/associated sxs/prior Treatment) Patient is a 79 y.o. male presenting with fall. The history is provided by the patient and medical records.  Fall  This is an 79 year old male with extensive past medical history presenting to the ED status post fall prior to arrival. Patient states he was lifting an item into the back of his pickup truck when he suddenly got a sensation that he was going to fall. He held onto the handle of the garage to her, but could not support his weight and fell to the ground, hitting his posterior scalp on a cement surface. Patient is unsure of loss of consciousness. Initially post injury, patient was confused and disoriented. Wife states symptoms are improving but she does not feel he has completely returned to baseline.  Patient is currently anti-coagulated with coumadin for AFIB.  Denies any recent changes in medications.  No recent illness, fevers, sweats, or chills.  Denies chest pain, SOB, palpitations, dizziness, weakness, numbness or paresthesias of extremities.  VS stable on arrival.   Past Medical History  Diagnosis Date  . Angina   . CHF (congestive heart failure)   . Coronary artery disease   . Heart murmur   . Hypertension     hyperlipidemia  . Shortness of breath     with exertion  . Recurrent upper respiratory infection (URI)     statrted with cold symptoms 05/30/11- large amount clear drainage out of nose, with cough- non productive- states chest hurts from coughing so much.  ?fever last PM  . Sleep apnea     last study years ago- SEVERS PER PATIENT- does not wear mask  . Diabetes mellitus   . Chronic kidney disease   . Renal lithiasis     states in both kidneys, hematuria  . H/O hiatal hernia   . Headache(784.0)    history of migraines  . Stroke     3 mini strokes-last one 1 1/2 yrs ago  . Cancer     skin cancer left ear  . Arthritis   . 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 Dysrhythmia     atrial fibrilliation,s/p PPM,bradycardia s/p PPM, hx tachy-brady syndrome. LAST OV WITH INTERROGATION ON CHART. Clearance Dr Ladona Ridgel with ok to stop coumadin 5 days pre op on chart  . Myocardial infarction     states silent heart attack years ago, but no damage-- stress test 2009 on chart  . Hematuria    Past Surgical History  Procedure Laterality Date  . Insert / replace / remove pacemaker      [employer]- last interrogation 7/12- followed by Dr Ladona Ridgel  . Cardiac catheterization      x 2 -years ago  . Hemorrhoid surgery    . Eye surgery      bilateral cataract extraction with IOL  . Appendectomy    . Hernia repair      bilateral inguinal  . Rotator cuff repair      right  . Doppler echocardiography  2009  . Ureteroscopy  06/24/2011    Procedure: URETEROSCOPY;  Surgeon: Milford Cage, MD;  Location: [facility];  Service: Urology;  Laterality: Left;  . Cystoscopy w/ retrogrades  06/24/2011    Procedure: CYSTOSCOPY WITH RETROGRADE PYELOGRAM;  Surgeon: Milford Cage, MD;  Location: [facility];  Service: Urology;  Laterality: Left;   History reviewed. No pertinent family history. History  Substance Use Topics  . Smoking status: Former Smoker -- 30 years    Types: Cigarettes, Pipe, Cigars    Quit date: 06/01/1971  . Smokeless tobacco: Never Used  . Alcohol Use: No    Review of Systems  Constitutional:       Fall  Skin: Positive for wound.  All other systems reviewed and are negative.    Allergies  Nisoldipine; Isosorbide mononitrate; and Sulfa antibiotics  Home Medications   Current Outpatient Rx  Name  Route  Sig  Dispense  Refill  . atorvastatin (LIPITOR) 40 MG tablet   Oral   Take 40 mg by mouth daily.          . cholecalciferol (VITAMIN D) 400 UNITS TABS   Oral   Take 400 Units by mouth every evening.          . Cinnamon 500 MG capsule   Oral   Take 500 mg by mouth 2 (two) times daily.          . digoxin (LANOXIN) 0.25 MG tablet   Oral   Take 0.25-0.5 mg by mouth daily. Takes 1 tablet daily in the morning Takes a second dose at night on Monday Wednesday and Friday         . diltiazem (CARDIZEM CD) 360 MG 24 hr capsule   Oral   Take 360 mg by mouth at bedtime.          Marland Kitchen doxazosin (CARDURA) 4 MG tablet   Oral   Take 2 mg by mouth at bedtime.          . finasteride (PROSCAR) 5 MG tablet   Oral   Take 5 mg by mouth daily.          . folic acid (FOLVITE) 400 MCG tablet   Oral   Take 400 mcg by mouth every evening.          Marland Kitchen glucosamine-chondroitin 500-400 MG tablet   Oral   Take 1 tablet by mouth 2 (two) times daily.          . insulin aspart (NOVOLOG) 100 UNIT/ML injection   Subcutaneous   Inject 10 Units into the skin every evening.          . insulin glargine (LANTUS) 100 UNIT/ML injection   Subcutaneous   Inject 52 Units into the skin daily.          Marland Kitchen lisinopril (PRINIVIL,ZESTRIL) 40 MG tablet   Oral   Take 40 mg by mouth every morning.          . metoprolol (LOPRESSOR) 100 MG tablet   Oral   Take 100 mg by mouth 2 (two) times daily.          . Misc Natural Products (APPLE CIDER VINEGAR) TABS   Oral   Take 1 tablet by mouth 2 (two) times daily.          . nitroGLYCERIN (NITROSTAT) 0.4 MG SL tablet   Sublingual   Place 0.4 mg under the tongue every 5 (five) minutes as needed. CHEST PAIN          . omeprazole (PRILOSEC) 20 MG capsule   Oral   Take 20 mg by mouth 2 (two) times daily.          Marland Kitchen  oxyCODONE-acetaminophen (PERCOCET/ROXICET) 5-325 MG per tablet   Oral   Take 1 tablet by mouth every 4 (four) hours as needed for pain.         . sitaGLIPtan-metformin (JANUMET) 50-500 MG per tablet   Oral   Take 1 tablet by  mouth 2 (two) times daily with a meal.          . vitamin B-12 (CYANOCOBALAMIN) 1000 MCG tablet   Oral   Take 1,000 mcg by mouth every evening.         . vitamin C (ASCORBIC ACID) 500 MG tablet   Oral   Take 500 mg by mouth every evening.          . warfarin (COUMADIN) 5 MG tablet   Oral   Take 5 mg by mouth daily.           BP 158/87  Pulse 74  Temp(Src) 97.2 F (36.2 C) (Oral)  Resp 18  SpO2 96%  Physical Exam  Nursing note and vitals reviewed. Constitutional: He is oriented to person, place, and time. He appears well-developed and well-nourished. No distress.  HENT:  Head: Normocephalic. Head is with contusion.    Mouth/Throat: Oropharynx is clear and moist.  TTP of left posterior scalp with small hematoma; no abrasion or laceration; no active bleeding  Eyes: Conjunctivae and EOM are normal. Pupils are equal, round, and reactive to light.  PERRL (direct and consensual)  Neck: Normal range of motion. Neck supple.  Cardiovascular: Normal rate, regular rhythm and normal heart sounds.   Pulmonary/Chest: Effort normal and breath sounds normal. No respiratory distress. He has no wheezes.  Abdominal: Soft. Bowel sounds are normal. There is no tenderness. There is no guarding.  Musculoskeletal: Normal range of motion.  Neurological: He is alert and oriented to person, place, and time. He has normal strength. He displays no tremor. No cranial nerve deficit or sensory deficit. He displays no seizure activity. Gait normal.  Pt AAOx3, answering questions appropriately; CN grossly intact, moves all extremities appropriately without ataxia, no focal neuro deficits or facial droop appreciated; ambulating with some assistance  Skin: Skin is warm and dry. He is not diaphoretic.  Psychiatric: He has a normal mood and affect.    ED Course  Procedures (including critical care time) Labs Review Labs Reviewed  PROTIME-INR - Abnormal; Notable for the following:    Prothrombin  Time 22.0 (*)    INR 1.99 (*)    All other components within normal limits  CBC WITH DIFFERENTIAL - Abnormal; Notable for the following:    RBC 3.70 (*)    Hemoglobin 12.4 (*)    HCT 35.9 (*)    All other components within normal limits  BASIC METABOLIC PANEL - Abnormal; Notable for the following:    Glucose, Bld 151 (*)    BUN 25 (*)    GFR calc non Af Amer 62 (*)    GFR calc Af Amer 72 (*)    All other components within normal limits  PRO B NATRIURETIC PEPTIDE - Abnormal; Notable for the following:    Pro B Natriuretic peptide (BNP) 1145.0 (*)    All other components within normal limits  DIGOXIN LEVEL  POCT I-STAT TROPONIN I   Imaging Review Ct Head Wo Contrast  05/26/2013   CLINICAL DATA:  Fall, confusion, hematoma to back of head, on Coumadin  EXAM: CT HEAD WITHOUT CONTRAST  CT CERVICAL SPINE WITHOUT CONTRAST  TECHNIQUE: Multidetector CT imaging of the head and cervical  spine was performed following the standard protocol without intravenous contrast. Multiplanar CT image reconstructions of the cervical spine were also generated.  COMPARISON:  CT head dated 07/24/2009  FINDINGS: CT HEAD FINDINGS  No evidence of parenchymal hemorrhage or extra-axial fluid collection. No mass lesion, mass effect, or midline shift.  No CT evidence of acute infarction.  Subcortical white matter and periventricular small vessel ischemic changes. Intracranial atherosclerosis.  Cerebral volume is within normal limits.  No ventriculomegaly.  The visualized paranasal sinuses are essentially clear. The mastoid air cells are unopacified.  Small extracranial hematoma overlying the left posterior vertex, measuring up to 4 mm in thickness (series 2/image 24).  No evidence of calvarial fracture.  CT CERVICAL SPINE FINDINGS  Exaggerated upper cervical lordosis.  No evidence of fracture. Vertebral body heights are maintained. Dens appears intact.  No prevertebral soft tissue swelling.  3 mm anterolisthesis of C4 on C5.   Mild to moderate degenerative changes, most prominent at C6-7.  Visualized thyroid is grossly unremarkable.  Visualized lung apices are notable for paraseptal emphysematous changes with apical pleural parenchymal scarring.  IMPRESSION: Small extracranial hematoma overlying the left posterior vertex. No evidence of calvarial fracture.  No evidence of acute intracranial abnormality.  No evidence of traumatic injury to the cervical spine.  Mild to moderate degenerative changes, as described above.   Electronically Signed   By: Charline Bills M.D.   On: 05/26/2013 18:54   Ct Cervical Spine Wo Contrast  05/26/2013   CLINICAL DATA:  Fall, confusion, hematoma to back of head, on Coumadin  EXAM: CT HEAD WITHOUT CONTRAST  CT CERVICAL SPINE WITHOUT CONTRAST  TECHNIQUE: Multidetector CT imaging of the head and cervical spine was performed following the standard protocol without intravenous contrast. Multiplanar CT image reconstructions of the cervical spine were also generated.  COMPARISON:  CT head dated 07/24/2009  FINDINGS: CT HEAD FINDINGS  No evidence of parenchymal hemorrhage or extra-axial fluid collection. No mass lesion, mass effect, or midline shift.  No CT evidence of acute infarction.  Subcortical white matter and periventricular small vessel ischemic changes. Intracranial atherosclerosis.  Cerebral volume is within normal limits.  No ventriculomegaly.  The visualized paranasal sinuses are essentially clear. The mastoid air cells are unopacified.  Small extracranial hematoma overlying the left posterior vertex, measuring up to 4 mm in thickness (series 2/image 24).  No evidence of calvarial fracture.  CT CERVICAL SPINE FINDINGS  Exaggerated upper cervical lordosis.  No evidence of fracture. Vertebral body heights are maintained. Dens appears intact.  No prevertebral soft tissue swelling.  3 mm anterolisthesis of C4 on C5.  Mild to moderate degenerative changes, most prominent at C6-7.  Visualized thyroid is  grossly unremarkable.  Visualized lung apices are notable for paraseptal emphysematous changes with apical pleural parenchymal scarring.  IMPRESSION: Small extracranial hematoma overlying the left posterior vertex. No evidence of calvarial fracture.  No evidence of acute intracranial abnormality.  No evidence of traumatic injury to the cervical spine.  Mild to moderate degenerative changes, as described above.   Electronically Signed   By: Charline Bills M.D.   On: 05/26/2013 18:54    EKG Interpretation    Date/Time:  Friday May 26 2013 17:36:06 EST Ventricular Rate:  75 PR Interval:  195 QRS Duration: 169 QT Interval:  428 QTC Calculation: 478 R Axis:   -76 Text Interpretation:  Ventricular-paced rhythm No significant change since last tracing Confirmed by Bebe Shaggy  MD, DONALD [id number]) on 05/26/2013 5:45:55 PM  MDM   1. Fall at home, initial encounter   2. Scalp hematoma, initial encounter    EKG with paced rhythm, no significant changes.  Pacemaker interrogated-- no significant findings.  Labs as above, appear at baseline.  CT head and CS revealing scalp hematoma without underlying hemorrhage or fx.  Orthostatics appropriate, pt ambulated with minimal assistance.  Pt has remained AAOx3 without concerning neurological deficits while in the ED.  Long discussion with pt and family, he and wife are comfortable returning home.  FU with PCP.  Signs/sx that would warrant return to the ED including recurrent falls, dizziness, weakness, blurred vision, severe headache, confusion, slurred speech, etc. were discussed with pt and family-- they acknowledged understanding and agreed.  Discussed with Dr. Bebe Shaggy who personally evaluated pt and agrees with assessment and plan of care.  Garlon Hatchet, PA-C 05/26/13 2004

## 2013-05-31 ENCOUNTER — Encounter: Payer: Self-pay | Admitting: Internal Medicine

## 2013-06-01 ENCOUNTER — Encounter: Payer: Self-pay | Admitting: Internal Medicine

## 2013-06-01 ENCOUNTER — Ambulatory Visit (INDEPENDENT_AMBULATORY_CARE_PROVIDER_SITE_OTHER): Payer: Medicare Other | Admitting: Internal Medicine

## 2013-06-01 VITALS — BP 130/70 | HR 75 | Ht 76.0 in | Wt 253.2 lb

## 2013-06-01 DIAGNOSIS — I4891 Unspecified atrial fibrillation: Secondary | ICD-10-CM

## 2013-06-01 DIAGNOSIS — I495 Sick sinus syndrome: Secondary | ICD-10-CM

## 2013-06-01 DIAGNOSIS — R0602 Shortness of breath: Secondary | ICD-10-CM

## 2013-06-01 LAB — MDC_IDC_ENUM_SESS_TYPE_INCLINIC
Battery Impedance: 2000 Ohm
Battery Voltage: 2.78 V
Implantable Pulse Generator Model: 5626
Lead Channel Pacing Threshold Pulse Width: 0.5 ms

## 2013-06-01 NOTE — Progress Notes (Addendum)
ELECTROPHYSIOLOGY OFFICE NOTE  Patient ID: Paul Dalton MRN: [id number], DOB/AGE: 1929-09-29   Date of Visit: 06/01/2013  Primary Physician: Londell Moh, MD Primary Cardiologist: Lewayne Bunting, MD Reason for Visit: Syncope  History of Present Illness  Paul Dalton is a 79 y.o. male with CHB s/p PPM implant, permanent AF, HTN, DM and polymyalgia rheumatica who presents today for electrophysiology followup after an episode of syncope.   Since last being seen in our clinic, he reports he was doing well until last Friday. He and his wife were lifting a 75-lb steel box into the back of a pickup truck and while doing so, he developed brief dizziness with abrupt syncope and fall, striking his head on the concrete floor. He denies CP, SOB or palpitations. His wife states he had brief amnesia following the episode. He reports double vision. He was evaluated in the ED and head CT showed extracranial hematoma. He was stable and discharged home to follow-up today.  Today he also reports DOE x 1 month. He has no other complaints. He denies chest pain. He denies palpitations. He has chronic LE swelling but denies worsening. He denies orthopnea or PND. He is compliant with medications.  Past Medical History Past Medical History  Diagnosis Date  . Angina   . Coronary artery disease   . Heart murmur   . Hypertension     hyperlipidemia  . Shortness of breath     with exertion  . Recurrent upper respiratory infection (URI)     statrted with cold symptoms 05/30/11- large amount clear drainage out of nose, with cough- non productive- states chest hurts from coughing so much.  ?fever last PM  . Sleep apnea     last study years ago- SEVERS PER PATIENT- does not wear mask  . Chronic kidney disease   . Renal lithiasis     states in both kidneys, hematuria  . H/O hiatal hernia   . Headache(784.0)     history of migraines  . Stroke     3 mini strokes-last one 1 1/2 yrs ago  . Cancer    skin cancer left ear  . Arthritis   . 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 Dysrhythmia     atrial fibrilliation,s/p PPM,bradycardia s/p PPM, hx tachy-brady syndrome. LAST OV WITH INTERROGATION ON CHART. Clearance Dr Ladona Ridgel with ok to stop coumadin 5 days pre op on chart  . Myocardial infarction     states silent heart attack years ago, but no damage-- stress test 2009 on chart  . Hematuria   . Syncope and collapse 05/26/13    ER visit 05/26/13 due to syncope. Possibly due to doxazosin.  . Diabetes mellitus with neurological manifestations, uncontrolled   . Edema   . Impotence of organic origin     ED  . Peptic ulcer, unspecified site, unspecified as acute or chronic, without mention of hemorrhage or perforation, with obstruction   . Pure hypercholesterolemia   . Chronic ischemic heart disease, unspecified   . Anemia, unspecified   . Asthmatic bronchitis   . AAA (abdominal aortic aneurysm)   . Carotid stenosis   . BPH (benign prostatic hyperplasia)   . Hemorrhoids   . LBBB (left bundle branch block)   . Eczema   . Actinic keratoses   . Nephrolithiasis   . Vitamin B12 deficiency   . Allergic rhinitis   . History of  MI (myocardial infarction)   . Osteoarthritis   . Esophageal reflux   . CHF (congestive heart failure)   . History of TIA (transient ischemic attack)   . Polymyalgia rheumatica   . Low back pain   . Hard of hearing   . Anaphylactic reaction     Tree nut (WALNUT ONLY)  . Coronary artery ectasia   . Carotid bruit   . Stable angina   . Dyspnea   . At high risk for falls     Past Surgical History Past Surgical History  Procedure Laterality Date  . Insert / replace / remove pacemaker      St Jude- last interrogation 7/12- followed by Dr Ladona Ridgel  . Cardiac catheterization      x 2 -years ago  . Hemorrhoid surgery    . Eye surgery      bilateral cataract extraction with IOL  .  Appendectomy    . Hernia repair      bilateral inguinal  . Rotator cuff repair      right  . Doppler echocardiography  2009  . Ureteroscopy  06/24/2011    Procedure: URETEROSCOPY;  Surgeon: Milford Cage, MD;  Location: [facility];  Service: Urology;  Laterality: Left;  . Cystoscopy w/ retrogrades  06/24/2011    Procedure: CYSTOSCOPY WITH RETROGRADE PYELOGRAM;  Surgeon: Milford Cage, MD;  Location: [facility];  Service: Urology;  Laterality: Left;  . Cardioversion  03/1998    Allergies/Intolerances Allergies  Allergen Reactions  . Peanut-Containing Drug Products Anaphylaxis and Other (See Comments)    ONLY WALNUTS  . Nisoldipine Nausea And Vomiting  . Procardia [Nifedipine] Other (See Comments)    Headaches  . Sular [Nisoldipine Er]   . Viagra [Sildenafil Citrate] Other (See Comments)    Headaches  . Isosorbide Mononitrate Rash  . Sulfa Antibiotics Rash  . Vioxx [Rofecoxib] Palpitations  . Zithromax [Azithromycin] Rash   Current Home Medications Current Outpatient Prescriptions  Medication Sig Dispense Refill  . atorvastatin (LIPITOR) 40 MG tablet Take 40 mg by mouth daily.      . cholecalciferol (VITAMIN D) 400 UNITS TABS Take 400 Units by mouth every evening.       . Cinnamon 500 MG capsule Take 500 mg by mouth 2 (two) times daily.       . digoxin (LANOXIN) 0.25 MG tablet Take 0.25-0.5 mg by mouth daily. Takes 1 tablet daily in the morning Takes a second dose at night on Monday Wednesday and Friday      . diltiazem (CARDIZEM CD) 360 MG 24 hr capsule Take 360 mg by mouth at bedtime.       Marland Kitchen doxazosin (CARDURA) 4 MG tablet Take 2 mg by mouth at bedtime.       . finasteride (PROSCAR) 5 MG tablet Take 5 mg by mouth daily.       . folic acid (FOLVITE) 400 MCG tablet Take 400 mcg by mouth every evening.       Marland Kitchen glucosamine-chondroitin 500-400 MG tablet Take 1 tablet by mouth 2 (two) times daily.       . insulin aspart (NOVOLOG) 100 UNIT/ML injection Inject 10 Units into the  skin every evening.       . insulin glargine (LANTUS) 100 UNIT/ML injection Inject 52 Units into the skin daily.       Marland Kitchen lisinopril (PRINIVIL,ZESTRIL) 40 MG tablet Take 40 mg by mouth every morning.       . metoprolol (LOPRESSOR) 100 MG tablet  Take 100 mg by mouth 2 (two) times daily.       . Misc Natural Products (APPLE CIDER VINEGAR) TABS Take 1 tablet by mouth 2 (two) times daily.       . nitroGLYCERIN (NITROSTAT) 0.4 MG SL tablet Place 0.4 mg under the tongue every 5 (five) minutes as needed. CHEST PAIN       . omeprazole (PRILOSEC) 20 MG capsule Take 20 mg by mouth 2 (two) times daily.       Marland Kitchen oxyCODONE-acetaminophen (PERCOCET/ROXICET) 5-325 MG per tablet Take 1 tablet by mouth every 4 (four) hours as needed for pain.      . sitaGLIPtan-metformin (JANUMET) 50-500 MG per tablet Take 1 tablet by mouth 2 (two) times daily with a meal.       . vitamin B-12 (CYANOCOBALAMIN) 1000 MCG tablet Take 1,000 mcg by mouth every evening.      . vitamin C (ASCORBIC ACID) 500 MG tablet Take 500 mg by mouth every evening.       . warfarin (COUMADIN) 5 MG tablet Take 5 mg by mouth daily.        No current facility-administered medications for this visit.   Social History History   Social History  . Marital Status: Married    Spouse Name: N/A    Number of Children: N/A  . Years of Education: N/A   Occupational History  . Not on file.   Social History Main Topics  . Smoking status: Former Smoker -- 30 years    Types: Cigarettes, Pipe, Cigars    Quit date: 06/01/1987  . Smokeless tobacco: Never Used  . Alcohol Use: No  . Drug Use: No  . Sexual Activity: Not on file   Other Topics Concern  . Not on file   Social History Narrative  . No narrative on file     Review of Systems General: No chills, fever, night sweats or weight changes Cardiovascular: No chest pain, dyspnea on exertion, edema, orthopnea, palpitations, paroxysmal nocturnal dyspnea Dermatological: No rash, lesions or  masses Respiratory: No cough, dyspnea Urologic: No hematuria, dysuria Abdominal: No nausea, vomiting, diarrhea, bright red blood per rectum, melena, or hematemesis Neurologic: No visual changes, weakness, changes in mental status All other systems reviewed and are otherwise negative except as noted above.  Physical Exam Vitals: Blood pressure 130/70, pulse 75, height 6\' 4"  (1.93 m), weight 253 lb 4 oz (114.873 kg).  General: Well developed, well appearing 79 y.o. male in no acute distress. HEENT: Normocephalic, atraumatic. EOMs intact. Sclera nonicteric. Oropharynx clear.  Neck: Supple without bruits. No JVD. Lungs: Respirations regular and unlabored, CTA bilaterally. No wheezes, rales or rhonchi. Heart: RRR. S1, S2 present. S2 reduced, Soft low pitched systolic murmur, rub, S3 or S4. Abdomen: Soft, non-tender, non-distended. BS present x 4 quadrants. No hepatosplenomegaly.  Extremities: No clubbing, cyanosis or edema. DP/PT/Radials 2+ and equal bilaterally. Psych: Normal affect. Neuro: Alert and oriented X 3. Moves all extremities spontaneously.   Diagnostics Cardiac catheterization 2004 FINAL DIAGNOSES:  1. Moderate to severe coronary ectasia, more on the right than the left with  possible increase in diameter in the right coronary artery since 1988.  2. Normal left ventricular function.  3. Infrarenal atherosclerotic plaque in the abdominal aorta, nonobstructive  with moderate calcification.  4. Normal renal arteries.  5. Successful Perclose to the right femoral artery.  DISPOSITION:  With a similar appearance in his coronary ectasia since 1988,  we will continue him on the medical treatment primarily  with anticoagulation  and control of his blood pressure. Will arrange for him to be discharged  home later today when stable postcatheterization. Echocardiogram 2009 SUMMARY - Overall left ventricular systolic function was normal. Left ventricular ejection fraction was estimated  , range being 55 % to 60 %. There were no left ventricular regional wall motion abnormalities. Left ventricular wall thickness was mildly increased. - Aortic valve thickness was mildly increased. There was trivial aortic valvular regurgitation. - There was mild aortic root dilatation. - There was mild mitral annular calcification. - The left atrium was moderately dilated. - There was the appearance of a catheter or pacing wire in the right ventricle. - The right atrium was mild to moderately dilated. Device interrogation - normal device function with good battery status and stable lead measurements; 0 VHR episodes; no programming changes; see PaceArt report  Assessment and Plan 1. Syncope / fall - similar to previous - no ventricular arrhythmias on device interrogation today 2. DOE - will update echo to assess LV function in setting of chronic RV pacing 3. CHB s/p PPM implant - normal device function - no programming changes made - return to clinic for follow-up in one year 4. Permanent AF - rate controlled - continue warfarin for stroke prevention  Signed, Lewayne Bunting, MD 06/01/2013, 3:28 PM

## 2013-06-01 NOTE — Patient Instructions (Signed)
Your physician wants you to follow-up in: 12 months with Dr Taylor You will receive a reminder letter in the mail two months in advance. If you don't receive a letter, please call our office to schedule the follow-up appointment.   Your physician has requested that you have an echocardiogram. Echocardiography is a painless test that uses sound waves to create images of your heart. It provides your doctor with information about the size and shape of your heart and how well your heart's chambers and valves are working. This procedure takes approximately one hour. There are no restrictions for this procedure.   

## 2013-06-14 ENCOUNTER — Encounter: Payer: Self-pay | Admitting: Internal Medicine

## 2013-06-20 ENCOUNTER — Encounter: Payer: Self-pay | Admitting: Cardiology

## 2013-06-20 ENCOUNTER — Ambulatory Visit (HOSPITAL_COMMUNITY): Payer: Medicare Other | Attending: Internal Medicine | Admitting: Radiology

## 2013-06-20 DIAGNOSIS — I359 Nonrheumatic aortic valve disorder, unspecified: Secondary | ICD-10-CM | POA: Insufficient documentation

## 2013-06-20 DIAGNOSIS — I10 Essential (primary) hypertension: Secondary | ICD-10-CM | POA: Insufficient documentation

## 2013-06-20 DIAGNOSIS — I2510 Atherosclerotic heart disease of native coronary artery without angina pectoris: Secondary | ICD-10-CM | POA: Insufficient documentation

## 2013-06-20 DIAGNOSIS — I059 Rheumatic mitral valve disease, unspecified: Secondary | ICD-10-CM | POA: Insufficient documentation

## 2013-06-20 DIAGNOSIS — I442 Atrioventricular block, complete: Secondary | ICD-10-CM | POA: Insufficient documentation

## 2013-06-20 DIAGNOSIS — I4891 Unspecified atrial fibrillation: Secondary | ICD-10-CM | POA: Insufficient documentation

## 2013-06-20 DIAGNOSIS — Z950 Presence of cardiac pacemaker: Secondary | ICD-10-CM | POA: Insufficient documentation

## 2013-06-20 DIAGNOSIS — R0602 Shortness of breath: Secondary | ICD-10-CM

## 2013-06-20 DIAGNOSIS — I079 Rheumatic tricuspid valve disease, unspecified: Secondary | ICD-10-CM | POA: Insufficient documentation

## 2013-06-20 DIAGNOSIS — R55 Syncope and collapse: Secondary | ICD-10-CM | POA: Insufficient documentation

## 2013-06-20 DIAGNOSIS — Z8673 Personal history of transient ischemic attack (TIA), and cerebral infarction without residual deficits: Secondary | ICD-10-CM | POA: Insufficient documentation

## 2013-06-20 NOTE — Progress Notes (Signed)
Echocardiogram performed.  

## 2013-07-04 ENCOUNTER — Encounter: Payer: Medicare Other | Admitting: Internal Medicine

## 2013-09-20 ENCOUNTER — Ambulatory Visit (INDEPENDENT_AMBULATORY_CARE_PROVIDER_SITE_OTHER): Payer: Medicare Other | Admitting: Internal Medicine

## 2013-09-20 ENCOUNTER — Encounter: Payer: Self-pay | Admitting: *Deleted

## 2013-09-20 ENCOUNTER — Encounter: Payer: Self-pay | Admitting: Internal Medicine

## 2013-09-20 VITALS — BP 146/83 | HR 75 | Ht 76.0 in | Wt 244.0 lb

## 2013-09-20 DIAGNOSIS — R0602 Shortness of breath: Secondary | ICD-10-CM

## 2013-09-20 DIAGNOSIS — R079 Chest pain, unspecified: Secondary | ICD-10-CM

## 2013-09-20 DIAGNOSIS — Z950 Presence of cardiac pacemaker: Secondary | ICD-10-CM

## 2013-09-20 DIAGNOSIS — R0789 Other chest pain: Secondary | ICD-10-CM | POA: Insufficient documentation

## 2013-09-20 DIAGNOSIS — I4891 Unspecified atrial fibrillation: Secondary | ICD-10-CM

## 2013-09-20 DIAGNOSIS — I350 Nonrheumatic aortic (valve) stenosis: Secondary | ICD-10-CM

## 2013-09-20 DIAGNOSIS — I359 Nonrheumatic aortic valve disorder, unspecified: Secondary | ICD-10-CM

## 2013-09-20 LAB — MDC_IDC_ENUM_SESS_TYPE_INCLINIC
Battery Impedance: 2300 Ohm
Battery Voltage: 2.76 V
Implantable Pulse Generator Model: 5626
Lead Channel Pacing Threshold Amplitude: 0.75 V
Lead Channel Pacing Threshold Pulse Width: 0.4 ms
Lead Channel Sensing Intrinsic Amplitude: 12 mV
Lead Channel Setting Pacing Pulse Width: 0.5 ms
Lead Channel Setting Sensing Sensitivity: 2 mV
MDC IDC MSMT LEADCHNL RV IMPEDANCE VALUE: 537 Ohm
MDC IDC PG SERIAL: 2041208
MDC IDC SESS DTM: 20150408200343
MDC IDC STAT BRADY RV PERCENT PACED: 99 % — AB

## 2013-09-20 NOTE — Assessment & Plan Note (Signed)
His history which includes sob, chest pressure and syncope are concerning for aortic stenosis, but his echo from three months ago only suggests moderate AS. I have recommended proceeding with left heart catheterization with right heart and coronary angios and LV gram with pull back to assess his aortic valve gradient.

## 2013-09-20 NOTE — Patient Instructions (Addendum)
Your physician has requested that you have a cardiac catheterization. Cardiac catheterization is used to diagnose and/or treat various heart conditions. Doctors may recommend this procedure for a number of different reasons. The most common reason is to evaluate chest pain. Chest pain can be a symptom of coronary artery disease (CAD), and cardiac catheterization can show whether plaque is narrowing or blocking your heart's arteries. This procedure is also used to evaluate the valves, as well as measure the blood flow and oxygen levels in different parts of your heart. For further information please visit [email or web address]. Please follow instruction sheet, as given.  See instruction sheet  NO Coumadin until after cath

## 2013-09-20 NOTE — Assessment & Plan Note (Signed)
His St. Jude DDD PM is working normally. Will recheck in several months. 

## 2013-09-20 NOTE — Progress Notes (Signed)
HPI [name] returns today for followup. He is a very pleasant 80 year old man with symptomatic bradycardia, complete heart block, atrial fibrillation, status post permanent pacemaker insertion. He has had worsening sob and chest pressure over the past few months. He had an echo in January which demonstrated mild/moderate aortic stenosis. He notes that his chest pressure occurs with exertion of any kind and his symptoms are class 3. These are clearly new and appear to be worsening.  In addition, he has experienced syncope with exertion. Allergies  Allergen Reactions  . Peanut-Containing Drug Products Anaphylaxis and Other (See Comments)    ONLY WALNUTS  . Nisoldipine Nausea And Vomiting  . Procardia [Nifedipine] Other (See Comments)    Headaches  . Sular [Nisoldipine Er]   . Viagra [Sildenafil Citrate] Other (See Comments)    Headaches  . Isosorbide Mononitrate Rash  . Sulfa Antibiotics Rash  . Vioxx [Rofecoxib] Palpitations  . Zithromax [Azithromycin] Rash     Current Outpatient Prescriptions  Medication Sig Dispense Refill  . atorvastatin (LIPITOR) 40 MG tablet Take 40 mg by mouth daily.      . cholecalciferol (VITAMIN D) 400 UNITS TABS Take 400 Units by mouth every evening.       . Cinnamon 500 MG capsule Take 500 mg by mouth 2 (two) times daily.       . digoxin (LANOXIN) 0.25 MG tablet Takes 1 tablet daily in the morning Takes a second dose at night on Monday Wednesday and Friday      . diltiazem (CARDIZEM CD) 360 MG 24 hr capsule Take 360 mg by mouth at bedtime.       Marland Kitchen doxazosin (CARDURA) 4 MG tablet Take 2 mg by mouth at bedtime.       . finasteride (PROSCAR) 5 MG tablet Take 5 mg by mouth daily.       . folic acid (FOLVITE) 326 MCG tablet Take 400 mcg by mouth every evening.       Marland Kitchen glucosamine-chondroitin 500-400 MG tablet Take 1 tablet by mouth 2 (two) times daily.       . insulin aspart (NOVOLOG) 100 UNIT/ML injection Inject 10 Units into the skin every evening.       .  insulin glargine (LANTUS) 100 UNIT/ML injection Inject 52 Units into the skin daily.       . metoprolol (LOPRESSOR) 100 MG tablet Take 100 mg by mouth 2 (two) times daily.       . Misc Natural Products (APPLE CIDER VINEGAR) TABS Take 1 tablet by mouth 2 (two) times daily.       . nitroGLYCERIN (NITROSTAT) 0.4 MG SL tablet Place 0.4 mg under the tongue every 5 (five) minutes as needed. CHEST PAIN       . omeprazole (PRILOSEC) 20 MG capsule Take 20 mg by mouth 2 (two) times daily.       Marland Kitchen oxyCODONE-acetaminophen (PERCOCET/ROXICET) 5-325 MG per tablet Take 1 tablet by mouth every 4 (four) hours as needed for pain.      . sitaGLIPtan-metformin (JANUMET) 50-500 MG per tablet Take 1 tablet by mouth 2 (two) times daily with a meal.       . vitamin B-12 (CYANOCOBALAMIN) 1000 MCG tablet Take 1,000 mcg by mouth every evening.      . vitamin C (ASCORBIC ACID) 500 MG tablet Take 500 mg by mouth every evening.       . warfarin (COUMADIN) 5 MG tablet Take 5 mg by mouth daily.  No current facility-administered medications for this visit.     Past Medical History  Diagnosis Date  . Angina   . Coronary artery disease   . Heart murmur   . Hypertension     hyperlipidemia  . Shortness of breath     with exertion  . Recurrent upper respiratory infection (URI)     statrted with cold symptoms 05/30/11- large amount clear drainage out of nose, with cough- non productive- states chest hurts from coughing so much.  ?fever last PM  . Sleep apnea     last study years ago- SEVERS PER PATIENT- does not wear mask  . Chronic kidney disease   . Renal lithiasis     states in both kidneys, hematuria  . H/O hiatal hernia   . Headache(784.0)     history of migraines  . Stroke     3 mini strokes-last one 1 1/2 yrs ago  . Cancer     skin cancer left ear  . Arthritis   . 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 Dysrhythmia      atrial fibrilliation,s/p PPM,bradycardia s/p PPM, hx tachy-brady syndrome. LAST OV WITH INTERROGATION ON CHART. Clearance Dr Lovena Le with ok to stop coumadin 5 days pre op on chart  . Myocardial infarction     states silent heart attack years ago, but no damage-- stress test 2009 on chart  . Hematuria   . Syncope and collapse 05/26/13    ER visit 05/26/13 due to syncope. Possibly due to doxazosin.  . Diabetes mellitus with neurological manifestations, uncontrolled   . Edema   . Impotence of organic origin     ED  . Peptic ulcer, unspecified site, unspecified as acute or chronic, without mention of hemorrhage or perforation, with obstruction   . Pure hypercholesterolemia   . Chronic ischemic heart disease, unspecified   . Anemia, unspecified   . Asthmatic bronchitis   . AAA (abdominal aortic aneurysm)   . Carotid stenosis   . BPH (benign prostatic hyperplasia)   . Hemorrhoids   . LBBB (left bundle branch block)   . Eczema   . Actinic keratoses   . Nephrolithiasis   . Vitamin B12 deficiency   . Allergic rhinitis   . History of MI (myocardial infarction)   . Osteoarthritis   . Esophageal reflux   . CHF (congestive heart failure)   . History of TIA (transient ischemic attack)   . Polymyalgia rheumatica   . Low back pain   . Hard of hearing   . Anaphylactic reaction     Tree nut (WALNUT ONLY)  . Coronary artery ectasia   . Carotid bruit   . Stable angina   . Dyspnea   . At high risk for falls   . Syncope     ROS:   All systems reviewed and negative except as noted in the HPI.   Past Surgical History  Procedure Laterality Date  . Insert / replace / remove pacemaker      St Jude- last interrogation 7/12- followed by Dr Lovena Le  . Cardiac catheterization      x 2 -years ago  . Hemorrhoid surgery    . Eye surgery      bilateral cataract extraction with IOL  . Appendectomy    . Hernia repair      bilateral inguinal  . Rotator cuff repair      right  .  Doppler  echocardiography  2009  . Ureteroscopy  06/24/2011    Procedure: URETEROSCOPY;  Surgeon: Molli Hazard, MD;  Location: [facility];  Service: Urology;  Laterality: Left;  . Cystoscopy w/ retrogrades  06/24/2011    Procedure: CYSTOSCOPY WITH RETROGRADE PYELOGRAM;  Surgeon: Molli Hazard, MD;  Location: [facility];  Service: Urology;  Laterality: Left;  . Cardioversion  03/1998     Family History  Problem Relation Age of Onset  . Heart disease Mother   . CVA Father      History   Social History  . Marital Status: Married    Spouse Name: N/A    Number of Children: N/A  . Years of Education: N/A   Occupational History  . Not on file.   Social History Main Topics  . Smoking status: Former Smoker -- 30 years    Types: Cigarettes, Pipe, Cigars    Quit date: 06/01/1987  . Smokeless tobacco: Never Used  . Alcohol Use: No  . Drug Use: No  . Sexual Activity: Not on file   Other Topics Concern  . Not on file   Social History Narrative  . No narrative on file     BP 146/83  Pulse 75  Ht 6\' 4"  (1.93 m)  Wt 244 lb (110.678 kg)  BMI 29.71 kg/m2  Physical Exam:  Well appearing elderly man, NAD HEENT: Unremarkable Neck:  7 cm JVD, no thyromegally Lungs:  Clear with no wheezes, rales, or rhonchi. HEART:  Regular rate rhythm, 2/6 systolic murmur c/w AS, and a 2/6 systolic murmur at the base c/w mitral regurge. Abd:  soft, positive bowel sounds, no organomegally, no rebound, no guarding Ext:  2 plus pulses, no edema, no cyanosis, no clubbing Skin:  No rashes no nodules Neuro:  CN II through XII intact, motor grossly intact  ECG - atrial fibrillation and ventricular pacing  DEVICE  Normal device function.  See PaceArt for details.   Assess/Plan:

## 2013-09-21 ENCOUNTER — Telehealth: Payer: Self-pay | Admitting: Pharmacist

## 2013-09-21 ENCOUNTER — Encounter (HOSPITAL_COMMUNITY): Payer: Self-pay | Admitting: Pharmacy Technician

## 2013-09-21 LAB — CBC WITH DIFFERENTIAL/PLATELET
BASOS ABS: 0 10*3/uL (ref 0.0–0.1)
Basophils Relative: 0.2 % (ref 0.0–3.0)
EOS ABS: 0.1 10*3/uL (ref 0.0–0.7)
Eosinophils Relative: 2.2 % (ref 0.0–5.0)
HEMATOCRIT: 31.5 % — AB (ref 39.0–52.0)
Hemoglobin: 10.6 g/dL — ABNORMAL LOW (ref 13.0–17.0)
LYMPHS ABS: 1.3 10*3/uL (ref 0.7–4.0)
Lymphocytes Relative: 20.3 % (ref 12.0–46.0)
MCHC: 33.8 g/dL (ref 30.0–36.0)
MCV: 97.8 fl (ref 78.0–100.0)
Monocytes Absolute: 0.4 10*3/uL (ref 0.1–1.0)
Monocytes Relative: 6.2 % (ref 3.0–12.0)
NEUTROS ABS: 4.5 10*3/uL (ref 1.4–7.7)
Neutrophils Relative %: 71.1 % (ref 43.0–77.0)
Platelets: 254 10*3/uL (ref 150.0–400.0)
RBC: 3.22 Mil/uL — ABNORMAL LOW (ref 4.22–5.81)
RDW: 15.4 % — AB (ref 11.5–14.6)
WBC: 6.3 10*3/uL (ref 4.5–10.5)

## 2013-09-21 LAB — PROTIME-INR
INR: 2.8 ratio — ABNORMAL HIGH (ref 0.8–1.0)
Prothrombin Time: 28.9 s — ABNORMAL HIGH (ref 10.2–12.4)

## 2013-09-21 LAB — BASIC METABOLIC PANEL
BUN: 19 mg/dL (ref 6–23)
CHLORIDE: 106 meq/L (ref 96–112)
CO2: 27 mEq/L (ref 19–32)
Calcium: 8.9 mg/dL (ref 8.4–10.5)
Creatinine, Ser: 0.9 mg/dL (ref 0.4–1.5)
GFR: 89.32 mL/min (ref >60.00)
GLUCOSE: 153 mg/dL — AB (ref 70–99)
POTASSIUM: 4.2 meq/L (ref 3.5–5.1)
Sodium: 140 mEq/L (ref 135–145)

## 2013-09-21 SURGERY — Surgical Case
Anesthesia: *Unknown

## 2013-09-21 NOTE — Telephone Encounter (Signed)
Message copied by Bishop Limbo on Thu Sep 21, 2013  3:38 PM ------      Message from: Candis Schatz      Created: Thu Sep 21, 2013  2:41 PM       Paul Dalton,            This is the pt who needs the INR check in the morning at 8:30. Dr Martinique wants it to be 1.7 or less. Just call and let me know what it is so we can formulate a plan if its still to high.            Thanks      Larena Glassman            [phone] ------

## 2013-09-21 NOTE — Telephone Encounter (Signed)
Patient will get INR drawn here 09/22/13 AM in hopes of being low enough for cath with Dr. Martinique (1.7 or less).  Call Larena Glassman with INR result while patient in office.

## 2013-09-22 ENCOUNTER — Encounter (HOSPITAL_COMMUNITY)
Admission: RE | Disposition: A | Discharge status: Home / Self Care | Payer: Self-pay | Source: Ambulatory Visit | Attending: Cardiology

## 2013-09-22 ENCOUNTER — Ambulatory Visit (INDEPENDENT_AMBULATORY_CARE_PROVIDER_SITE_OTHER): Payer: Medicare Other

## 2013-09-22 ENCOUNTER — Ambulatory Visit (HOSPITAL_COMMUNITY)
Admission: RE | Admit: 2013-09-22 | Discharge: 2013-09-22 | Disposition: A | Discharge status: Home / Self Care | Payer: Medicare Other | Source: Ambulatory Visit | Attending: Cardiology | Admitting: Cardiology

## 2013-09-22 ENCOUNTER — Telehealth: Payer: Self-pay | Admitting: Pharmacist

## 2013-09-22 DIAGNOSIS — D649 Anemia, unspecified: Secondary | ICD-10-CM | POA: Insufficient documentation

## 2013-09-22 DIAGNOSIS — Z9181 History of falling: Secondary | ICD-10-CM | POA: Insufficient documentation

## 2013-09-22 DIAGNOSIS — K219 Gastro-esophageal reflux disease without esophagitis: Secondary | ICD-10-CM | POA: Insufficient documentation

## 2013-09-22 DIAGNOSIS — E1142 Type 2 diabetes mellitus with diabetic polyneuropathy: Secondary | ICD-10-CM | POA: Insufficient documentation

## 2013-09-22 DIAGNOSIS — I059 Rheumatic mitral valve disease, unspecified: Secondary | ICD-10-CM | POA: Insufficient documentation

## 2013-09-22 DIAGNOSIS — J45909 Unspecified asthma, uncomplicated: Secondary | ICD-10-CM | POA: Insufficient documentation

## 2013-09-22 DIAGNOSIS — I2510 Atherosclerotic heart disease of native coronary artery without angina pectoris: Secondary | ICD-10-CM | POA: Insufficient documentation

## 2013-09-22 DIAGNOSIS — Z5181 Encounter for therapeutic drug level monitoring: Secondary | ICD-10-CM

## 2013-09-22 DIAGNOSIS — I509 Heart failure, unspecified: Secondary | ICD-10-CM | POA: Insufficient documentation

## 2013-09-22 DIAGNOSIS — I129 Hypertensive chronic kidney disease with stage 1 through stage 4 chronic kidney disease, or unspecified chronic kidney disease: Secondary | ICD-10-CM | POA: Insufficient documentation

## 2013-09-22 DIAGNOSIS — R079 Chest pain, unspecified: Secondary | ICD-10-CM

## 2013-09-22 DIAGNOSIS — I714 Abdominal aortic aneurysm, without rupture, unspecified: Secondary | ICD-10-CM | POA: Insufficient documentation

## 2013-09-22 DIAGNOSIS — M353 Polymyalgia rheumatica: Secondary | ICD-10-CM | POA: Insufficient documentation

## 2013-09-22 DIAGNOSIS — Z8673 Personal history of transient ischemic attack (TIA), and cerebral infarction without residual deficits: Secondary | ICD-10-CM | POA: Insufficient documentation

## 2013-09-22 DIAGNOSIS — N400 Enlarged prostate without lower urinary tract symptoms: Secondary | ICD-10-CM | POA: Insufficient documentation

## 2013-09-22 DIAGNOSIS — I447 Left bundle-branch block, unspecified: Secondary | ICD-10-CM | POA: Insufficient documentation

## 2013-09-22 DIAGNOSIS — G4730 Sleep apnea, unspecified: Secondary | ICD-10-CM | POA: Insufficient documentation

## 2013-09-22 DIAGNOSIS — N529 Male erectile dysfunction, unspecified: Secondary | ICD-10-CM | POA: Insufficient documentation

## 2013-09-22 DIAGNOSIS — I350 Nonrheumatic aortic (valve) stenosis: Secondary | ICD-10-CM

## 2013-09-22 DIAGNOSIS — I252 Old myocardial infarction: Secondary | ICD-10-CM | POA: Insufficient documentation

## 2013-09-22 DIAGNOSIS — N189 Chronic kidney disease, unspecified: Secondary | ICD-10-CM | POA: Insufficient documentation

## 2013-09-22 DIAGNOSIS — E7800 Pure hypercholesterolemia, unspecified: Secondary | ICD-10-CM | POA: Insufficient documentation

## 2013-09-22 DIAGNOSIS — Z7901 Long term (current) use of anticoagulants: Secondary | ICD-10-CM | POA: Insufficient documentation

## 2013-09-22 DIAGNOSIS — Z794 Long term (current) use of insulin: Secondary | ICD-10-CM | POA: Insufficient documentation

## 2013-09-22 DIAGNOSIS — G43909 Migraine, unspecified, not intractable, without status migrainosus: Secondary | ICD-10-CM | POA: Insufficient documentation

## 2013-09-22 DIAGNOSIS — I2789 Other specified pulmonary heart diseases: Secondary | ICD-10-CM | POA: Insufficient documentation

## 2013-09-22 DIAGNOSIS — E1149 Type 2 diabetes mellitus with other diabetic neurological complication: Secondary | ICD-10-CM | POA: Insufficient documentation

## 2013-09-22 DIAGNOSIS — Z950 Presence of cardiac pacemaker: Secondary | ICD-10-CM | POA: Insufficient documentation

## 2013-09-22 DIAGNOSIS — I442 Atrioventricular block, complete: Secondary | ICD-10-CM | POA: Insufficient documentation

## 2013-09-22 DIAGNOSIS — E785 Hyperlipidemia, unspecified: Secondary | ICD-10-CM | POA: Insufficient documentation

## 2013-09-22 DIAGNOSIS — I259 Chronic ischemic heart disease, unspecified: Secondary | ICD-10-CM | POA: Insufficient documentation

## 2013-09-22 DIAGNOSIS — I498 Other specified cardiac arrhythmias: Secondary | ICD-10-CM | POA: Insufficient documentation

## 2013-09-22 DIAGNOSIS — I2584 Coronary atherosclerosis due to calcified coronary lesion: Secondary | ICD-10-CM | POA: Insufficient documentation

## 2013-09-22 DIAGNOSIS — I4891 Unspecified atrial fibrillation: Secondary | ICD-10-CM | POA: Insufficient documentation

## 2013-09-22 DIAGNOSIS — I6529 Occlusion and stenosis of unspecified carotid artery: Secondary | ICD-10-CM | POA: Insufficient documentation

## 2013-09-22 DIAGNOSIS — M1990 Unspecified osteoarthritis, unspecified site: Secondary | ICD-10-CM | POA: Insufficient documentation

## 2013-09-22 DIAGNOSIS — E538 Deficiency of other specified B group vitamins: Secondary | ICD-10-CM | POA: Insufficient documentation

## 2013-09-22 DIAGNOSIS — R0602 Shortness of breath: Secondary | ICD-10-CM

## 2013-09-22 HISTORY — PX: LEFT AND RIGHT HEART CATHETERIZATION WITH CORONARY ANGIOGRAM: SHX5449

## 2013-09-22 LAB — POCT I-STAT 3, VENOUS BLOOD GAS (G3P V)
Acid-Base Excess: 2 mmol/L (ref 0.0–2.0)
Bicarbonate: 26.1 mEq/L — ABNORMAL HIGH (ref 20.0–24.0)
O2 Saturation: 74 %
TCO2: 27 mmol/L (ref 0–100)
pCO2, Ven: 38.6 mmHg — ABNORMAL LOW (ref 45.0–50.0)
pH, Ven: 7.438 — ABNORMAL HIGH (ref 7.250–7.300)
pO2, Ven: 38 mmHg (ref 30.0–45.0)

## 2013-09-22 LAB — POCT I-STAT 3, ART BLOOD GAS (G3+)
Acid-Base Excess: 1 mmol/L (ref 0.0–2.0)
Bicarbonate: 25.6 mEq/L — ABNORMAL HIGH (ref 20.0–24.0)
O2 Saturation: 90 %
PH ART: 7.445 (ref 7.350–7.450)
TCO2: 27 mmol/L (ref 0–100)
pCO2 arterial: 37.2 mmHg (ref 35.0–45.0)
pO2, Arterial: 57 mmHg — ABNORMAL LOW (ref 80.0–100.0)

## 2013-09-22 LAB — POCT INR: INR: 1.9

## 2013-09-22 LAB — GLUCOSE, CAPILLARY: Glucose-Capillary: 108 mg/dL — ABNORMAL HIGH (ref 70–99)

## 2013-09-22 SURGERY — LEFT AND RIGHT HEART CATHETERIZATION WITH CORONARY ANGIOGRAM
Anesthesia: LOCAL

## 2013-09-22 MED ORDER — SODIUM CHLORIDE 0.9 % IJ SOLN: 3.0000 mL | INTRAMUSCULAR | Status: DC | PRN

## 2013-09-22 MED ORDER — VERAPAMIL HCL 2.5 MG/ML IV SOLN
INTRAVENOUS | Status: AC
  Filled 2013-09-22: qty 2

## 2013-09-22 MED ORDER — SODIUM CHLORIDE 0.9 % IV SOLN: 1.0000 mL/kg/h | INTRAVENOUS | Status: DC

## 2013-09-22 MED ORDER — SODIUM CHLORIDE 0.9 % IV SOLN
INTRAVENOUS | Status: DC
  Administered 2013-09-22: 13:00:00 via INTRAVENOUS

## 2013-09-22 MED ORDER — MIDAZOLAM HCL 2 MG/2ML IJ SOLN
INTRAMUSCULAR | Status: AC
  Filled 2013-09-22: qty 2

## 2013-09-22 MED ORDER — HEPARIN SODIUM (PORCINE) 1000 UNIT/ML IJ SOLN
INTRAMUSCULAR | Status: AC
  Filled 2013-09-22: qty 1

## 2013-09-22 MED ORDER — FENTANYL CITRATE 0.05 MG/ML IJ SOLN
INTRAMUSCULAR | Status: AC
  Filled 2013-09-22: qty 2

## 2013-09-22 MED ORDER — NITROGLYCERIN 0.2 MG/ML ON CALL CATH LAB
INTRAVENOUS | Status: AC
  Filled 2013-09-22: qty 1

## 2013-09-22 MED ORDER — HEPARIN (PORCINE) IN NACL 2-0.9 UNIT/ML-% IJ SOLN
INTRAMUSCULAR | Status: AC
  Filled 2013-09-22: qty 1000

## 2013-09-22 MED ORDER — SODIUM CHLORIDE 0.9 % IJ SOLN: 3.0000 mL | Freq: Two times a day (BID) | INTRAMUSCULAR | Status: DC

## 2013-09-22 MED ORDER — ASPIRIN 81 MG PO CHEW: 81.0000 mg | CHEWABLE_TABLET | ORAL | Status: DC

## 2013-09-22 MED ORDER — LIDOCAINE HCL (PF) 1 % IJ SOLN
INTRAMUSCULAR | Status: AC
  Filled 2013-09-22: qty 30

## 2013-09-22 MED ORDER — SODIUM CHLORIDE 0.9 % IV SOLN: 250.0000 mL | INTRAVENOUS | Status: DC | PRN

## 2013-09-22 NOTE — H&P (View-Only) (Signed)
HPI Paul Dalton returns today for followup. He is a very pleasant 80 year old man with symptomatic bradycardia, complete heart block, atrial fibrillation, status post permanent pacemaker insertion. He has had worsening sob and chest pressure over the past few months. He had an echo in January which demonstrated mild/moderate aortic stenosis. He notes that his chest pressure occurs with exertion of any kind and his symptoms are class 3. These are clearly new and appear to be worsening.  In addition, he has experienced syncope with exertion. Allergies  Allergen Reactions  . Peanut-Containing Drug Products Anaphylaxis and Other (See Comments)    ONLY WALNUTS  . Nisoldipine Nausea And Vomiting  . Procardia [Nifedipine] Other (See Comments)    Headaches  . Sular [Nisoldipine Er]   . Viagra [Sildenafil Citrate] Other (See Comments)    Headaches  . Isosorbide Mononitrate Rash  . Sulfa Antibiotics Rash  . Vioxx [Rofecoxib] Palpitations  . Zithromax [Azithromycin] Rash     Current Outpatient Prescriptions  Medication Sig Dispense Refill  . atorvastatin (LIPITOR) 40 MG tablet Take 40 mg by mouth daily.      . cholecalciferol (VITAMIN D) 400 UNITS TABS Take 400 Units by mouth every evening.       . Cinnamon 500 MG capsule Take 500 mg by mouth 2 (two) times daily.       . digoxin (LANOXIN) 0.25 MG tablet Takes 1 tablet daily in the morning Takes a second dose at night on Monday Wednesday and Friday      . diltiazem (CARDIZEM CD) 360 MG 24 hr capsule Take 360 mg by mouth at bedtime.       Marland Kitchen doxazosin (CARDURA) 4 MG tablet Take 2 mg by mouth at bedtime.       . finasteride (PROSCAR) 5 MG tablet Take 5 mg by mouth daily.       . folic acid (FOLVITE) 326 MCG tablet Take 400 mcg by mouth every evening.       Marland Kitchen glucosamine-chondroitin 500-400 MG tablet Take 1 tablet by mouth 2 (two) times daily.       . insulin aspart (NOVOLOG) 100 UNIT/ML injection Inject 10 Units into the skin every evening.       .  insulin glargine (LANTUS) 100 UNIT/ML injection Inject 52 Units into the skin daily.       . metoprolol (LOPRESSOR) 100 MG tablet Take 100 mg by mouth 2 (two) times daily.       . Misc Natural Products (APPLE CIDER VINEGAR) TABS Take 1 tablet by mouth 2 (two) times daily.       . nitroGLYCERIN (NITROSTAT) 0.4 MG SL tablet Place 0.4 mg under the tongue every 5 (five) minutes as needed. CHEST PAIN       . omeprazole (PRILOSEC) 20 MG capsule Take 20 mg by mouth 2 (two) times daily.       Marland Kitchen oxyCODONE-acetaminophen (PERCOCET/ROXICET) 5-325 MG per tablet Take 1 tablet by mouth every 4 (four) hours as needed for pain.      . sitaGLIPtan-metformin (JANUMET) 50-500 MG per tablet Take 1 tablet by mouth 2 (two) times daily with a meal.       . vitamin B-12 (CYANOCOBALAMIN) 1000 MCG tablet Take 1,000 mcg by mouth every evening.      . vitamin C (ASCORBIC ACID) 500 MG tablet Take 500 mg by mouth every evening.       . warfarin (COUMADIN) 5 MG tablet Take 5 mg by mouth daily.  No current facility-administered medications for this visit.     Past Medical History  Diagnosis Date  . Angina   . Coronary artery disease   . Heart murmur   . Hypertension     hyperlipidemia  . Shortness of breath     with exertion  . Recurrent upper respiratory infection (URI)     statrted with cold symptoms 05/30/11- large amount clear drainage out of nose, with cough- non productive- states chest hurts from coughing so much.  ?fever last PM  . Sleep apnea     last study years ago- SEVERS PER PATIENT- does not wear mask  . Chronic kidney disease   . Renal lithiasis     states in both kidneys, hematuria  . H/O hiatal hernia   . Headache(784.0)     history of migraines  . Stroke     3 mini strokes-last one 1 1/2 yrs ago  . Cancer     skin cancer left ear  . Arthritis   . 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 Dysrhythmia      atrial fibrilliation,s/p PPM,bradycardia s/p PPM, hx tachy-brady syndrome. LAST OV WITH INTERROGATION ON CHART. Clearance Dr Darianny Momon with ok to stop coumadin 5 days pre op on chart  . Myocardial infarction     states silent heart attack years ago, but no damage-- stress test 2009 on chart  . Hematuria   . Syncope and collapse 05/26/13    ER visit 05/26/13 due to syncope. Possibly due to doxazosin.  . Diabetes mellitus with neurological manifestations, uncontrolled   . Edema   . Impotence of organic origin     ED  . Peptic ulcer, unspecified site, unspecified as acute or chronic, without mention of hemorrhage or perforation, with obstruction   . Pure hypercholesterolemia   . Chronic ischemic heart disease, unspecified   . Anemia, unspecified   . Asthmatic bronchitis   . AAA (abdominal aortic aneurysm)   . Carotid stenosis   . BPH (benign prostatic hyperplasia)   . Hemorrhoids   . LBBB (left bundle branch block)   . Eczema   . Actinic keratoses   . Nephrolithiasis   . Vitamin B12 deficiency   . Allergic rhinitis   . History of MI (myocardial infarction)   . Osteoarthritis   . Esophageal reflux   . CHF (congestive heart failure)   . History of TIA (transient ischemic attack)   . Polymyalgia rheumatica   . Low back pain   . Hard of hearing   . Anaphylactic reaction     Tree nut (WALNUT ONLY)  . Coronary artery ectasia   . Carotid bruit   . Stable angina   . Dyspnea   . At high risk for falls   . Syncope     ROS:   All systems reviewed and negative except as noted in the HPI.   Past Surgical History  Procedure Laterality Date  . Insert / replace / remove pacemaker      St Jude- last interrogation 7/12- followed by Dr Jamiyah Dingley  . Cardiac catheterization      x 2 -years ago  . Hemorrhoid surgery    . Eye surgery      bilateral cataract extraction with IOL  . Appendectomy    . Hernia repair      bilateral inguinal  . Rotator cuff repair      right  .   Doppler  echocardiography  2009  . Ureteroscopy  06/24/2011    Procedure: URETEROSCOPY;  Surgeon: Daniel Young Woodruff, MD;  Location: [facility];  Service: Urology;  Laterality: Left;  . Cystoscopy w/ retrogrades  06/24/2011    Procedure: CYSTOSCOPY WITH RETROGRADE PYELOGRAM;  Surgeon: Daniel Young Woodruff, MD;  Location: [facility];  Service: Urology;  Laterality: Left;  . Cardioversion  03/1998     Family History  Problem Relation Age of Onset  . Heart disease Mother   . CVA Father      History   Social History  . Marital Status: Married    Spouse Name: N/A    Number of Children: N/A  . Years of Education: N/A   Occupational History  . Not on file.   Social History Main Topics  . Smoking status: Former Smoker -- 30 years    Types: Cigarettes, Pipe, Cigars    Quit date: 06/01/1987  . Smokeless tobacco: Never Used  . Alcohol Use: No  . Drug Use: No  . Sexual Activity: Not on file   Other Topics Concern  . Not on file   Social History Narrative  . No narrative on file     BP 146/83  Pulse 75  Ht 6' 4" (1.93 m)  Wt 244 lb (110.678 kg)  BMI 29.71 kg/m2  Physical Exam:  Well appearing elderly man, NAD HEENT: Unremarkable Neck:  7 cm JVD, no thyromegally Lungs:  Clear with no wheezes, rales, or rhonchi. HEART:  Regular rate rhythm, 2/6 systolic murmur c/w AS, and a 2/6 systolic murmur at the base c/w mitral regurge. Abd:  soft, positive bowel sounds, no organomegally, no rebound, no guarding Ext:  2 plus pulses, no edema, no cyanosis, no clubbing Skin:  No rashes no nodules Neuro:  CN II through XII intact, motor grossly intact  ECG - atrial fibrillation and ventricular pacing  DEVICE  Normal device function.  See PaceArt for details.   Assess/Plan:    

## 2013-09-22 NOTE — Progress Notes (Signed)
Pt had INR this am at office, result of 1.9 noted. Dr. Martinique aware of result and does not want lab repeated.

## 2013-09-22 NOTE — Telephone Encounter (Signed)
Patient called and notified to recheck INR on Tuesday or Wednesday with [facility] as they manage his protimes and know what doses he typically takes.

## 2013-09-22 NOTE — CV Procedure (Signed)
   Cardiac Catheterization Procedure Note  Name: Paul Dalton MRN: [id number] DOB: 1929/12/20  Procedure: Right Heart Cath, Left Heart Cath, Selective Coronary Angiography, LV angiography  Indication: 80 yo WM with increasing dyspnea and chest tightness on exertion. Echo demonstrates mild to moderate MR.   Procedural Details: The left wrist was prepped, draped, and anesthetized with 1% lidocaine. Using the modified Seldinger technique a 5 French sheath was placed in the left femoral artery and a 5 French sheath was placed in the left brachial vein. A Swan-Ganz catheter was used for the right heart catheterization. Standard protocol was followed for recording of right heart pressures and sampling of oxygen saturations. Fick cardiac output was calculated. There was a radial loop noted but this was negotiated without difficulty. Standard Judkins catheters were used for selective coronary angiography and left ventriculography. There were no immediate procedural complications. The patient was transferred to the post catheterization recovery area for further monitoring.  Procedural Findings: Hemodynamics RA 0/9 mean 7 mm Hg RV 51/6 mm Hg PA 53/20 mean 25 mm Hg PCWP 18/38 mean 23 mm Hg, very large V waves.  LV 162/12 AO 153/75 mean 107 mm Hg  AV gradient is less than 10 mm Hg  Oxygen saturations: PA 74% AO 90%  Cardiac Output (Fick) 13 L/min  Cardiac Index (Fick) 5.77 L/min/meter squared   Coronary angiography: Coronary dominance: right  Left mainstem: Large. Normal.   Left anterior descending (LAD): Moderately calcified. Proximal ectasia. Diffuse 20% irregularities in the proximal and mid vessel.   Left circumflex (LCx): Moderate ectasia in the proximal to mid vessel. No significant obstructive disease.  Right coronary artery (RCA): Diffuse ectasia. 20% disease in the mid vessel.   Left ventriculography: Left ventricular systolic function is normal, LVEF is estimated at 50-55%,  there is moderate. mitral regurgitation   Final Conclusions:   1. Diffuse coronary ectasia without obstructive CAD 2. Good LV function 3. Mild Aortic stenosis with valve gradient less than 10 mm Hg 4. Moderate pulmonary HTN. There are large V waves on PCWP tracing suggesting significant MR. Moderate MR noted on angiography.  Recommendations: Findings suggest that his symptoms are related to pulmonary HTN and/or significant mitral insufficiency. Prior Echo demonstrated only mild MR. May want to consider TEE to further evaluate. There is no significant aortic stenosis by cath data.    Ander Slade [facility] 09/22/2013, 3:39 PM

## 2013-09-22 NOTE — Telephone Encounter (Signed)
Message copied by Bishop Limbo on Fri Sep 22, 2013  4:58 PM ------      Message from: Candis Schatz      Created: Fri Sep 22, 2013  4:01 PM       Paul Dalton,            Mr. Kindred had his cath today. Dr Martinique told pt to restart his coumadin on Sat 09/23/13. Can you please have someone call & have the pt come in on Tuesday or Wednesday?            Thanks      Trisha ------

## 2013-09-22 NOTE — Interval H&P Note (Signed)
History and Physical Interval Note:  09/22/2013 2:24 PM  Paul Dalton  has presented today for surgery, with the diagnosis of CP, aortic stenosis, SOB  The various methods of treatment have been discussed with the patient and family. After consideration of risks, benefits and other options for treatment, the patient has consented to  Procedure(s): LEFT AND RIGHT HEART CATHETERIZATION WITH CORONARY ANGIOGRAM  (N/A) as a surgical intervention .  The patient's history has been reviewed, patient examined, no change in status, stable for surgery.  I have reviewed the patient's chart and labs.  Questions were answered to the patient's satisfaction.    Cath Lab Visit (complete for each Cath Lab visit)  Clinical Evaluation Leading to the Procedure:   ACS: no  Non-ACS:    Anginal Classification: CCS III  Anti-ischemic medical therapy: Maximal Therapy (2 or more classes of medications)  Non-Invasive Test Results: No non-invasive testing performed  Prior CABG: No previous CABG       Paul Dalton [facility] 09/22/2013 2:25 PM

## 2013-09-22 NOTE — Discharge Instructions (Addendum)
You may resume coumadin tomorrow 09/23/13 at prior dose.  Radial Site Care Refer to this sheet in the next few weeks. These instructions provide you with information on caring for yourself after your procedure. Your caregiver may also give you more specific instructions. Your treatment has been planned according to current medical practices, but problems sometimes occur. Call your caregiver if you have any problems or questions after your procedure. HOME CARE INSTRUCTIONS  You may shower the day after the procedure.Remove the bandage (dressing) and gently wash the site with plain soap and water.Gently pat the site dry.  Do not apply powder or lotion to the site.  Do not submerge the affected site in water for 3 to 5 days.  Inspect the site at least twice daily.  Do not flex or bend the affected arm for 24 hours.  No lifting over 5 pounds (2.3 kg) for 5 days after your procedure.  Do not drive home if you are discharged the same day of the procedure. Have someone else drive you.  You may drive 24 hours after the procedure unless otherwise instructed by your caregiver.  Do not operate machinery or power tools for 24 hours.  A responsible adult should be with you for the first 24 hours after you arrive home. What to expect:  Any bruising will usually fade within 1 to 2 weeks.  Blood that collects in the tissue (hematoma) may be painful to the touch. It should usually decrease in size and tenderness within 1 to 2 weeks. SEEK IMMEDIATE MEDICAL CARE IF:  You have unusual pain at the radial site.  You have redness, warmth, swelling, or pain at the radial site.  You have drainage (other than a small amount of blood on the dressing).  You have chills.  You have a fever or persistent symptoms for more than 72 hours.  You have a fever and your symptoms suddenly get worse.  Your arm becomes pale, cool, tingly, or numb.  You have heavy bleeding from the site. Hold pressure on the  site. Document Released: 07/04/2010 Document Revised: 08/24/2011 Document Reviewed: 07/04/2010 [facility] Patient Information 2014 [place], [place].

## 2013-10-04 ENCOUNTER — Encounter: Payer: Self-pay | Admitting: Cardiology

## 2013-10-09 ENCOUNTER — Telehealth: Payer: Self-pay | Admitting: Internal Medicine

## 2013-10-09 DIAGNOSIS — R0602 Shortness of breath: Secondary | ICD-10-CM

## 2013-10-09 NOTE — Telephone Encounter (Signed)
Spoke with patient and let him know I would speak with Dr Lovena Le and call him back tomorrow.

## 2013-10-09 NOTE — Telephone Encounter (Signed)
New message    Had cath by Dr Martinique.  He was supposed to get with Dr Lovena Le and they come up with a plan.  Pt want to know if they have decided what to do with him

## 2013-10-10 ENCOUNTER — Other Ambulatory Visit: Payer: Self-pay | Admitting: *Deleted

## 2013-10-10 MED ORDER — POTASSIUM CHLORIDE CRYS ER 20 MEQ PO TBCR: 20.0000 meq | EXTENDED_RELEASE_TABLET | Freq: Every day | ORAL | Status: AC

## 2013-10-10 MED ORDER — FUROSEMIDE 40 MG PO TABS: 40.0000 mg | ORAL_TABLET | Freq: Every day | ORAL | Status: DC

## 2013-10-10 NOTE — Telephone Encounter (Signed)
Dr Lovena Le reviewed and wants him to start Furosemide 40mg  daily and Potassium 58meq daily.  He will need a BMP in 7-10days and a follow up with Dr Lovena Le in 3 weeks. 10/31/13 at 2:45  I have called the patient and he is aware

## 2013-10-23 ENCOUNTER — Other Ambulatory Visit (INDEPENDENT_AMBULATORY_CARE_PROVIDER_SITE_OTHER): Payer: Medicare Other

## 2013-10-23 DIAGNOSIS — R0602 Shortness of breath: Secondary | ICD-10-CM

## 2013-10-23 LAB — BASIC METABOLIC PANEL
BUN: 22 mg/dL (ref 6–23)
CHLORIDE: 102 meq/L (ref 96–112)
CO2: 29 meq/L (ref 19–32)
Calcium: 9.3 mg/dL (ref 8.4–10.5)
Creatinine, Ser: 1 mg/dL (ref 0.4–1.5)
GFR: 72.68 mL/min (ref >60.00)
Glucose, Bld: 139 mg/dL — ABNORMAL HIGH (ref 70–99)
Potassium: 4.1 mEq/L (ref 3.5–5.1)
SODIUM: 138 meq/L (ref 135–145)

## 2013-10-26 ENCOUNTER — Other Ambulatory Visit: Payer: Self-pay

## 2013-10-31 ENCOUNTER — Ambulatory Visit (INDEPENDENT_AMBULATORY_CARE_PROVIDER_SITE_OTHER): Payer: Medicare Other | Admitting: Internal Medicine

## 2013-10-31 ENCOUNTER — Encounter: Payer: Self-pay | Admitting: Internal Medicine

## 2013-10-31 ENCOUNTER — Encounter (INDEPENDENT_AMBULATORY_CARE_PROVIDER_SITE_OTHER): Payer: Self-pay

## 2013-10-31 VITALS — BP 116/69 | HR 74 | Ht 76.0 in | Wt 243.0 lb

## 2013-10-31 DIAGNOSIS — I495 Sick sinus syndrome: Secondary | ICD-10-CM

## 2013-10-31 DIAGNOSIS — I5032 Chronic diastolic (congestive) heart failure: Secondary | ICD-10-CM

## 2013-10-31 DIAGNOSIS — R0602 Shortness of breath: Secondary | ICD-10-CM

## 2013-10-31 DIAGNOSIS — Z950 Presence of cardiac pacemaker: Secondary | ICD-10-CM

## 2013-10-31 DIAGNOSIS — I4891 Unspecified atrial fibrillation: Secondary | ICD-10-CM

## 2013-10-31 LAB — MDC_IDC_ENUM_SESS_TYPE_INCLINIC
Battery Impedance: 2300 Ohm
Brady Statistic RV Percent Paced: 99 %
Implantable Pulse Generator Model: 5626
Lead Channel Impedance Value: 566 Ohm
Lead Channel Pacing Threshold Amplitude: 0.5 V
Lead Channel Setting Pacing Pulse Width: 0.5 ms
Lead Channel Setting Sensing Sensitivity: 2 mV
MDC IDC MSMT BATTERY VOLTAGE: 2.78 V
MDC IDC MSMT LEADCHNL RV PACING THRESHOLD PULSEWIDTH: 0.5 ms
MDC IDC PG SERIAL: 2041208
MDC IDC SESS DTM: 20150519155644

## 2013-10-31 MED ORDER — FUROSEMIDE 40 MG PO TABS: 40.0000 mg | ORAL_TABLET | Freq: Two times a day (BID) | ORAL | Status: AC

## 2013-10-31 MED ORDER — DIGOXIN 125 MCG PO TABS: 0.1250 mg | ORAL_TABLET | Freq: Every day | ORAL | Status: AC

## 2013-10-31 MED ORDER — DILTIAZEM HCL ER COATED BEADS 180 MG PO CP24: 180.0000 mg | ORAL_CAPSULE | Freq: Every day | ORAL | Status: AC

## 2013-10-31 NOTE — Progress Notes (Signed)
HPI Paul Dalton returns today for followup. He is a very pleasant 80 year old man with symptomatic bradycardia, complete heart block, atrial fibrillation, status post permanent pacemaker insertion. He has had worsening sob and chest pressure over the past few months. He had an echo in January which demonstrated mild/moderate aortic stenosis. He notes that his chest pressure occurs with exertion of any kind and his symptoms are class 3. He underwent heart catheterization several weeks ago and returns today for followup. In the interim, he still has sob but thinks that his symptoms are slightly better. He has persistent peripheral edema.  Allergies  Allergen Reactions  . Peanut-Containing Drug Products Anaphylaxis and Other (See Comments)    ONLY WALNUTS  . Nisoldipine Nausea And Vomiting  . Procardia [Nifedipine] Other (See Comments)    Headaches  . Sular [Nisoldipine Er]   . Viagra [Sildenafil Citrate] Other (See Comments)    Headaches  . Isosorbide Mononitrate Rash  . Sulfa Antibiotics Rash  . Vioxx [Rofecoxib] Palpitations  . Zithromax [Azithromycin] Rash     Current Outpatient Prescriptions  Medication Sig Dispense Refill  . ACCU-CHEK AVIVA PLUS test strip       . atorvastatin (LIPITOR) 40 MG tablet Take 40 mg by mouth daily.      . cholecalciferol (VITAMIN D) 400 UNITS TABS Take 400 Units by mouth every evening.       . Cinnamon 500 MG capsule Take 500 mg by mouth 2 (two) times daily.       . digoxin (LANOXIN) 0.25 MG tablet Take 0.25 mg by mouth daily.       Marland Kitchen diltiazem (CARDIZEM CD) 360 MG 24 hr capsule Take 360 mg by mouth at bedtime.       Marland Kitchen doxazosin (CARDURA) 4 MG tablet Take 2 mg by mouth at bedtime.       . finasteride (PROSCAR) 5 MG tablet Take 5 mg by mouth daily.       . folic acid (FOLVITE) 678 MCG tablet Take 400 mcg by mouth every evening.       . furosemide (LASIX) 40 MG tablet Take 1 tablet (40 mg total) by mouth daily.  90 tablet  3  . glucosamine-chondroitin  500-400 MG tablet Take 1 tablet by mouth 2 (two) times daily.       . insulin aspart (NOVOLOG) 100 UNIT/ML injection Inject 10 Units into the skin every evening.       . insulin glargine (LANTUS) 100 UNIT/ML injection Inject 52 Units into the skin daily.       . metoprolol (LOPRESSOR) 100 MG tablet Take 100 mg by mouth 2 (two) times daily.       . Misc Natural Products (APPLE CIDER VINEGAR) TABS Take 1 tablet by mouth 2 (two) times daily.       . nitroGLYCERIN (NITROSTAT) 0.4 MG SL tablet Place 0.4 mg under the tongue every 5 (five) minutes as needed. CHEST PAIN       . omeprazole (PRILOSEC) 20 MG capsule Take 20 mg by mouth 2 (two) times daily.       Marland Kitchen oxyCODONE-acetaminophen (PERCOCET/ROXICET) 5-325 MG per tablet Take 1 tablet by mouth every 4 (four) hours as needed for pain.      . potassium chloride SA (KLOR-CON M20) 20 MEQ tablet Take 1 tablet (20 mEq total) by mouth daily.  90 tablet  3  . sitaGLIPtan-metformin (JANUMET) 50-500 MG per tablet Take 1 tablet by mouth 2 (two) times daily with a meal.       .  vitamin B-12 (CYANOCOBALAMIN) 1000 MCG tablet Take 1,000 mcg by mouth every evening.      . vitamin C (ASCORBIC ACID) 500 MG tablet Take 500 mg by mouth every evening.       . warfarin (COUMADIN) 5 MG tablet        No current facility-administered medications for this visit.     Past Medical History  Diagnosis Date  . Angina   . Coronary artery disease   . Heart murmur   . Hypertension     hyperlipidemia  . Shortness of breath     with exertion  . Recurrent upper respiratory infection (URI)     statrted with cold symptoms 05/30/11- large amount clear drainage out of nose, with cough- non productive- states chest hurts from coughing so much.  ?fever last PM  . Sleep apnea     last study years ago- SEVERS PER PATIENT- does not wear mask  . Chronic kidney disease   . Renal lithiasis     states in both kidneys, hematuria  . H/O hiatal hernia   . Headache(784.0)     history of  migraines  . Stroke     3 mini strokes-last one 1 1/2 yrs ago  . Cancer     skin cancer left ear  . Arthritis   . 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 Dysrhythmia     atrial fibrilliation,s/p PPM,bradycardia s/p PPM, hx tachy-brady syndrome. LAST OV WITH INTERROGATION ON CHART. Clearance Dr Lovena Le with ok to stop coumadin 5 days pre op on chart  . Myocardial infarction     states silent heart attack years ago, but no damage-- stress test 2009 on chart  . Hematuria   . Syncope and collapse 05/26/13    ER visit 05/26/13 due to syncope. Possibly due to doxazosin.  . Diabetes mellitus with neurological manifestations, uncontrolled   . Edema   . Impotence of organic origin     ED  . Peptic ulcer, unspecified site, unspecified as acute or chronic, without mention of hemorrhage or perforation, with obstruction   . Pure hypercholesterolemia   . Chronic ischemic heart disease, unspecified   . Anemia, unspecified   . Asthmatic bronchitis   . AAA (abdominal aortic aneurysm)   . Carotid stenosis   . BPH (benign prostatic hyperplasia)   . Hemorrhoids   . LBBB (left bundle branch block)   . Eczema   . Actinic keratoses   . Nephrolithiasis   . Vitamin B12 deficiency   . Allergic rhinitis   . History of MI (myocardial infarction)   . Osteoarthritis   . Esophageal reflux   . CHF (congestive heart failure)   . History of TIA (transient ischemic attack)   . Polymyalgia rheumatica   . Low back pain   . Hard of hearing   . Anaphylactic reaction     Tree nut (WALNUT ONLY)  . Coronary artery ectasia   . Carotid bruit   . Stable angina   . Dyspnea   . At high risk for falls   . Syncope     ROS:   All systems reviewed and negative except as noted in the HPI.   Past Surgical History  Procedure Laterality Date  . Insert / replace / remove pacemaker      St Jude- last interrogation 7/12- followed by Dr  Lovena Le  . Cardiac catheterization  x 2 -years ago  . Hemorrhoid surgery    . Eye surgery      bilateral cataract extraction with IOL  . Appendectomy    . Hernia repair      bilateral inguinal  . Rotator cuff repair      right  . Doppler echocardiography  2009  . Ureteroscopy  06/24/2011    Procedure: URETEROSCOPY;  Surgeon: Molli Hazard, MD;  Location: [facility];  Service: Urology;  Laterality: Left;  . Cystoscopy w/ retrogrades  06/24/2011    Procedure: CYSTOSCOPY WITH RETROGRADE PYELOGRAM;  Surgeon: Molli Hazard, MD;  Location: [facility];  Service: Urology;  Laterality: Left;  . Cardioversion  03/1998     Family History  Problem Relation Age of Onset  . Heart disease Mother   . CVA Father      History   Social History  . Marital Status: Married    Spouse Name: N/A    Number of Children: N/A  . Years of Education: N/A   Occupational History  . Not on file.   Social History Main Topics  . Smoking status: Former Smoker -- 30 years    Types: Cigarettes, Pipe, Cigars    Quit date: 06/01/1987  . Smokeless tobacco: Never Used  . Alcohol Use: No  . Drug Use: No  . Sexual Activity: Not on file   Other Topics Concern  . Not on file   Social History Narrative  . No narrative on file     BP 116/69  Pulse 74  Ht 6\' 4"  (1.93 m)  Wt 243 lb (110.224 kg)  BMI 29.59 kg/m2  Physical Exam:  stable appearing elderly man, NAD HEENT: Unremarkable Neck:  7 cm JVD, no thyromegally Lungs:  Clear with no wheezes, rales, or rhonchi. HEART:  Regular rate rhythm, 2/6 systolic murmur c/w AS, and a 2/6 systolic murmur at the base c/w mitral regurge. Abd:  soft, positive bowel sounds, no organomegally, no rebound, no guarding Ext:  2 plus pulses, no edema, no cyanosis, no clubbing Skin:  No rashes no nodules Neuro:  CN II through XII intact, motor grossly intact  ECG - atrial fibrillation and ventricular pacing  DEVICE  Normal device function.  See PaceArt for  details.   Assess/Plan:

## 2013-10-31 NOTE — Assessment & Plan Note (Signed)
His ventricular rate is well controlled. No change in medical therapy. I have asked him to continue his current medical therapy.

## 2013-10-31 NOTE — Patient Instructions (Signed)
Your physician wants you to follow-up in: 6 months with Dr Knox Saliva will receive a reminder letter in the mail two months in advance. If you don't receive a letter, please call our office to schedule the follow-up appointment.  Your physician has recommended you make the following change in your medication:  1) Decrease Cardizem to 180mg  daily 2) Decrease Digoxin to 0.125mg  daily 3) Increase Furosemide to 40mg  twice daily

## 2013-10-31 NOTE — Assessment & Plan Note (Addendum)
His symptoms remain class 2B. I have asked him to increase his diuretic dose as he had pulmonary hypertension at cath. He will reduce his dose of calcium channel blocker as he has had some fatigue and blood pressure down a bit.

## 2013-10-31 NOTE — Assessment & Plan Note (Signed)
His St. Jude VVIR PPM is working normally. Will recheck in several months.

## 2013-11-01 ENCOUNTER — Encounter: Payer: Self-pay | Admitting: Internal Medicine

## 2014-04-20 ENCOUNTER — Encounter (HOSPITAL_COMMUNITY): Payer: Self-pay | Admitting: *Deleted

## 2014-04-20 ENCOUNTER — Emergency Department (HOSPITAL_COMMUNITY)
Admission: EM | Admit: 2014-04-20 | Discharge: 2014-04-20 | Disposition: A | Discharge status: Home / Self Care | Payer: Medicare Other | Attending: Emergency Medicine | Admitting: Emergency Medicine

## 2014-04-20 ENCOUNTER — Emergency Department (HOSPITAL_COMMUNITY): Payer: Medicare Other

## 2014-04-20 DIAGNOSIS — Z87891 Personal history of nicotine dependence: Secondary | ICD-10-CM | POA: Diagnosis not present

## 2014-04-20 DIAGNOSIS — E538 Deficiency of other specified B group vitamins: Secondary | ICD-10-CM | POA: Diagnosis not present

## 2014-04-20 DIAGNOSIS — R011 Cardiac murmur, unspecified: Secondary | ICD-10-CM | POA: Diagnosis not present

## 2014-04-20 DIAGNOSIS — Z87442 Personal history of urinary calculi: Secondary | ICD-10-CM | POA: Diagnosis not present

## 2014-04-20 DIAGNOSIS — N201 Calculus of ureter: Secondary | ICD-10-CM | POA: Diagnosis not present

## 2014-04-20 DIAGNOSIS — Z872 Personal history of diseases of the skin and subcutaneous tissue: Secondary | ICD-10-CM | POA: Diagnosis not present

## 2014-04-20 DIAGNOSIS — Z9181 History of falling: Secondary | ICD-10-CM | POA: Diagnosis not present

## 2014-04-20 DIAGNOSIS — Z794 Long term (current) use of insulin: Secondary | ICD-10-CM | POA: Diagnosis not present

## 2014-04-20 DIAGNOSIS — G43909 Migraine, unspecified, not intractable, without status migrainosus: Secondary | ICD-10-CM | POA: Diagnosis not present

## 2014-04-20 DIAGNOSIS — K219 Gastro-esophageal reflux disease without esophagitis: Secondary | ICD-10-CM | POA: Insufficient documentation

## 2014-04-20 DIAGNOSIS — I2510 Atherosclerotic heart disease of native coronary artery without angina pectoris: Secondary | ICD-10-CM | POA: Insufficient documentation

## 2014-04-20 DIAGNOSIS — Z950 Presence of cardiac pacemaker: Secondary | ICD-10-CM | POA: Diagnosis not present

## 2014-04-20 DIAGNOSIS — E119 Type 2 diabetes mellitus without complications: Secondary | ICD-10-CM | POA: Diagnosis not present

## 2014-04-20 DIAGNOSIS — E780 Pure hypercholesterolemia: Secondary | ICD-10-CM | POA: Insufficient documentation

## 2014-04-20 DIAGNOSIS — I252 Old myocardial infarction: Secondary | ICD-10-CM | POA: Diagnosis not present

## 2014-04-20 DIAGNOSIS — Z9889 Other specified postprocedural states: Secondary | ICD-10-CM | POA: Diagnosis not present

## 2014-04-20 DIAGNOSIS — R109 Unspecified abdominal pain: Secondary | ICD-10-CM | POA: Diagnosis present

## 2014-04-20 DIAGNOSIS — Z85828 Personal history of other malignant neoplasm of skin: Secondary | ICD-10-CM | POA: Diagnosis not present

## 2014-04-20 DIAGNOSIS — N189 Chronic kidney disease, unspecified: Secondary | ICD-10-CM | POA: Insufficient documentation

## 2014-04-20 DIAGNOSIS — M1990 Unspecified osteoarthritis, unspecified site: Secondary | ICD-10-CM | POA: Insufficient documentation

## 2014-04-20 DIAGNOSIS — J45909 Unspecified asthma, uncomplicated: Secondary | ICD-10-CM | POA: Insufficient documentation

## 2014-04-20 DIAGNOSIS — Z79899 Other long term (current) drug therapy: Secondary | ICD-10-CM | POA: Insufficient documentation

## 2014-04-20 DIAGNOSIS — E785 Hyperlipidemia, unspecified: Secondary | ICD-10-CM | POA: Insufficient documentation

## 2014-04-20 DIAGNOSIS — D649 Anemia, unspecified: Secondary | ICD-10-CM | POA: Diagnosis not present

## 2014-04-20 DIAGNOSIS — Z7901 Long term (current) use of anticoagulants: Secondary | ICD-10-CM | POA: Diagnosis not present

## 2014-04-20 DIAGNOSIS — H9190 Unspecified hearing loss, unspecified ear: Secondary | ICD-10-CM | POA: Diagnosis not present

## 2014-04-20 DIAGNOSIS — I129 Hypertensive chronic kidney disease with stage 1 through stage 4 chronic kidney disease, or unspecified chronic kidney disease: Secondary | ICD-10-CM | POA: Diagnosis not present

## 2014-04-20 DIAGNOSIS — Z8673 Personal history of transient ischemic attack (TIA), and cerebral infarction without residual deficits: Secondary | ICD-10-CM | POA: Diagnosis not present

## 2014-04-20 LAB — CBC WITH DIFFERENTIAL/PLATELET
Basophils Absolute: 0 10*3/uL (ref 0.0–0.1)
Basophils Relative: 0 % (ref 0–1)
EOS PCT: 2 % (ref 0–5)
Eosinophils Absolute: 0.1 10*3/uL (ref 0.0–0.7)
HEMATOCRIT: 33.2 % — AB (ref 39.0–52.0)
Hemoglobin: 11.3 g/dL — ABNORMAL LOW (ref 13.0–17.0)
LYMPHS ABS: 1.3 10*3/uL (ref 0.7–4.0)
LYMPHS PCT: 21 % (ref 12–46)
MCH: 32.7 pg (ref 26.0–34.0)
MCHC: 34 g/dL (ref 30.0–36.0)
MCV: 96 fL (ref 78.0–100.0)
MONO ABS: 0.6 10*3/uL (ref 0.1–1.0)
MONOS PCT: 10 % (ref 3–12)
Neutro Abs: 4.2 10*3/uL (ref 1.7–7.7)
Neutrophils Relative %: 67 % (ref 43–77)
Platelets: 160 10*3/uL (ref 150–400)
RBC: 3.46 MIL/uL — AB (ref 4.22–5.81)
RDW: 14.2 % (ref 11.5–15.5)
WBC: 6.3 10*3/uL (ref 4.0–10.5)

## 2014-04-20 LAB — URINALYSIS, ROUTINE W REFLEX MICROSCOPIC
Bilirubin Urine: NEGATIVE
GLUCOSE, UA: NEGATIVE mg/dL
Ketones, ur: NEGATIVE mg/dL
Leukocytes, UA: NEGATIVE
Nitrite: NEGATIVE
Protein, ur: NEGATIVE mg/dL
SPECIFIC GRAVITY, URINE: 1.014 (ref 1.005–1.030)
Urobilinogen, UA: 1 mg/dL (ref 0.0–1.0)
pH: 7 (ref 5.0–8.0)

## 2014-04-20 LAB — BASIC METABOLIC PANEL
Anion gap: 13 (ref 5–15)
BUN: 26 mg/dL — ABNORMAL HIGH (ref 6–23)
CO2: 28 meq/L (ref 19–32)
CREATININE: 1.05 mg/dL (ref 0.50–1.35)
Calcium: 9.5 mg/dL (ref 8.4–10.5)
Chloride: 101 mEq/L (ref 96–112)
GFR calc Af Amer: 74 mL/min — ABNORMAL LOW (ref >90)
GFR calc non Af Amer: 64 mL/min — ABNORMAL LOW (ref >90)
GLUCOSE: 126 mg/dL — AB (ref 70–99)
Potassium: 3.7 mEq/L (ref 3.7–5.3)
SODIUM: 142 meq/L (ref 137–147)

## 2014-04-20 LAB — URINE MICROSCOPIC-ADD ON

## 2014-04-20 MED ORDER — OXYCODONE-ACETAMINOPHEN 5-325 MG PO TABS: 1.0000 | ORAL_TABLET | ORAL | Status: DC | PRN

## 2014-04-20 MED ORDER — ONDANSETRON 8 MG PO TBDP
8.0000 mg | ORAL_TABLET | Freq: Three times a day (TID) | ORAL | Status: DC | PRN
Start: 2014-04-20 — End: 2014-08-25

## 2014-04-20 MED ORDER — MORPHINE SULFATE 4 MG/ML IJ SOLN
6.0000 mg | Freq: Once | INTRAMUSCULAR | Status: AC
  Administered 2014-04-20: 6 mg via INTRAVENOUS
  Filled 2014-04-20: qty 2

## 2014-04-20 MED ORDER — ONDANSETRON HCL 4 MG/2ML IJ SOLN
4.0000 mg | Freq: Once | INTRAMUSCULAR | Status: AC
  Administered 2014-04-20: 4 mg via INTRAVENOUS
  Filled 2014-04-20: qty 2

## 2014-04-20 NOTE — ED Notes (Signed)
Pt states that he has a hx of kidney stones; pt states that he began to have rt flank pain around 8pm; pt states that it has progressively gotten worse and now radiates to RLQ abd pain; pt states that it feels like prior kidney stones; pt c/o urinary frequency and urgency

## 2014-04-20 NOTE — ED Provider Notes (Signed)
CSN: [id number]     Arrival date & time 04/20/14  0255 History   First MD Initiated Contact with Patient 04/20/14 0301     Chief Complaint  Patient presents with  . Flank Pain      HPI Patient presents to the emergency department complaining of acute onset right flank pain with radiation down into his right groin.  Nausea tonight without vomiting.  This began around 8 PM.  His pain and discomfort has consistently gotten worse.  He has a history kidney stones and states this feels similar.  No history of abdominal aneurysm.  No fevers or chills.  Denies diarrhea.  Otherwise has been his normal state of health.  No testicular pain.  Pain is moderate in severity at this time.  Nothing worsens or improves his pain.   Past Medical History  Diagnosis Date  . Angina   . Coronary artery disease   . Heart murmur   . Hypertension     hyperlipidemia  . Shortness of breath     with exertion  . Recurrent upper respiratory infection (URI)     statrted with cold symptoms 05/30/11- large amount clear drainage out of nose, with cough- non productive- states chest hurts from coughing so much.  ?fever last PM  . Sleep apnea     last study years ago- SEVERS PER PATIENT- does not wear mask  . Chronic kidney disease   . Renal lithiasis     states in both kidneys, hematuria  . H/O hiatal hernia   . Headache(784.0)     history of migraines  . Stroke     3 mini strokes-last one 1 1/2 yrs ago  . Cancer     skin cancer left ear  . Arthritis   . 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 Dysrhythmia     atrial fibrilliation,s/p PPM,bradycardia s/p PPM, hx tachy-brady syndrome. LAST OV WITH INTERROGATION ON CHART. Clearance Dr Lovena Le with ok to stop coumadin 5 days pre op on chart  . Myocardial infarction     states silent heart attack years ago, but no damage-- stress test 2009 on chart  . Hematuria   . Syncope and collapse  05/26/13    ER visit 05/26/13 due to syncope. Possibly due to doxazosin.  . Diabetes mellitus with neurological manifestations, uncontrolled   . Edema   . Impotence of organic origin     ED  . Peptic ulcer, unspecified site, unspecified as acute or chronic, without mention of hemorrhage or perforation, with obstruction   . Pure hypercholesterolemia   . Chronic ischemic heart disease, unspecified   . Anemia, unspecified   . Asthmatic bronchitis   . AAA (abdominal aortic aneurysm)   . Carotid stenosis   . BPH (benign prostatic hyperplasia)   . Hemorrhoids   . LBBB (left bundle branch block)   . Eczema   . Actinic keratoses   . Nephrolithiasis   . Vitamin B12 deficiency   . Allergic rhinitis   . History of MI (myocardial infarction)   . Osteoarthritis   . Esophageal reflux   . CHF (congestive heart failure)   . History of TIA (transient ischemic attack)   . Polymyalgia rheumatica   . Low back pain   . Hard of hearing   . Anaphylactic reaction     Tree nut (WALNUT ONLY)  . Coronary artery ectasia   . Carotid bruit   .  Stable angina   . Dyspnea   . At high risk for falls   . Syncope    Past Surgical History  Procedure Laterality Date  . Insert / replace / remove pacemaker      St Jude- last interrogation 7/12- followed by Dr Lovena Le  . Cardiac catheterization      x 2 -years ago  . Hemorrhoid surgery    . Eye surgery      bilateral cataract extraction with IOL  . Appendectomy    . Hernia repair      bilateral inguinal  . Rotator cuff repair      right  . Doppler echocardiography  2009  . Ureteroscopy  06/24/2011    Procedure: URETEROSCOPY;  Surgeon: Molli Hazard, MD;  Location: [facility];  Service: Urology;  Laterality: Left;  . Cystoscopy w/ retrogrades  06/24/2011    Procedure: CYSTOSCOPY WITH RETROGRADE PYELOGRAM;  Surgeon: Molli Hazard, MD;  Location: [facility];  Service: Urology;  Laterality: Left;  . Cardioversion  03/1998   Family History  Problem  Relation Age of Onset  . Heart disease Mother   . CVA Father    History  Substance Use Topics  . Smoking status: Former Smoker -- 30 years    Types: Cigarettes, Pipe, Cigars    Quit date: 06/01/1987  . Smokeless tobacco: Never Used  . Alcohol Use: No    Review of Systems  All other systems reviewed and are negative.     Allergies  Peanut-containing drug products; Nisoldipine; Procardia; Sular; Viagra; Isosorbide mononitrate; Sulfa antibiotics; Vioxx; and Zithromax  Home Medications   Prior to Admission medications   Medication Sig Start Date End Date Taking? Authorizing Provider  acetaminophen (TYLENOL) 500 MG tablet Take 1,000 mg by mouth every 6 (six) hours as needed for mild pain.   Yes Historical Provider, MD  atorvastatin (LIPITOR) 40 MG tablet Take 40 mg by mouth daily.   Yes Historical Provider, MD  cholecalciferol (VITAMIN D) 400 UNITS TABS Take 400 Units by mouth every evening.    Yes Historical Provider, MD  Cinnamon 500 MG capsule Take 500 mg by mouth 2 (two) times daily.    Yes Historical Provider, MD  digoxin (LANOXIN) 0.125 MG tablet Take 1 tablet (0.125 mg total) by mouth daily. 10/31/13  Yes Evans Lance, MD  diltiazem (CARDIZEM CD) 180 MG 24 hr capsule Take 1 capsule (180 mg total) by mouth at bedtime. 10/31/13  Yes Evans Lance, MD  doxazosin (CARDURA) 4 MG tablet Take 2 mg by mouth at bedtime.    Yes Historical Provider, MD  finasteride (PROSCAR) 5 MG tablet Take 5 mg by mouth daily.    Yes Historical Provider, MD  folic acid (FOLVITE) 517 MCG tablet Take 400 mcg by mouth every evening.    Yes Historical Provider, MD  furosemide (LASIX) 40 MG tablet Take 1 tablet (40 mg total) by mouth 2 (two) times daily. 10/31/13  Yes Evans Lance, MD  glucosamine-chondroitin 500-400 MG tablet Take 1 tablet by mouth 2 (two) times daily.    Yes Historical Provider, MD  insulin aspart (NOVOLOG) 100 UNIT/ML injection Inject 10 Units into the skin every evening.    Yes  Historical Provider, MD  insulin glargine (LANTUS) 100 UNIT/ML injection Inject 52 Units into the skin daily.    Yes Historical Provider, MD  metoprolol (LOPRESSOR) 100 MG tablet Take 100 mg by mouth 2 (two) times daily.    Yes Historical Provider, MD  Misc Natural Products (APPLE CIDER VINEGAR) TABS Take 1 tablet by mouth 2 (two) times daily.    Yes Historical Provider, MD  nitroGLYCERIN (NITROSTAT) 0.4 MG SL tablet Place 0.4 mg under the tongue every 5 (five) minutes as needed. CHEST PAIN    Yes Historical Provider, MD  omeprazole (PRILOSEC) 20 MG capsule Take 20 mg by mouth 2 (two) times daily.    Yes Historical Provider, MD  potassium chloride SA (KLOR-CON M20) 20 MEQ tablet Take 1 tablet (20 mEq total) by mouth daily. 10/10/13  Yes Evans Lance, MD  sitaGLIPtan-metformin (JANUMET) 50-500 MG per tablet Take 1 tablet by mouth 2 (two) times daily with a meal.    Yes Historical Provider, MD  vitamin B-12 (CYANOCOBALAMIN) 1000 MCG tablet Take 1,000 mcg by mouth every evening.   Yes Historical Provider, MD  vitamin C (ASCORBIC ACID) 500 MG tablet Take 500 mg by mouth every evening.    Yes Historical Provider, MD  warfarin (COUMADIN) 5 MG tablet Take 5 mg by mouth daily at 6 PM.  09/27/13  Yes Historical Provider, MD  Rossie test strip  08/02/13   Historical Provider, MD  ondansetron (ZOFRAN ODT) 8 MG disintegrating tablet Take 1 tablet (8 mg total) by mouth every 8 (eight) hours as needed for nausea or vomiting. 04/20/14   Hoy Morn, MD  oxyCODONE-acetaminophen (PERCOCET/ROXICET) 5-325 MG per tablet Take 1 tablet by mouth every 4 (four) hours as needed for severe pain. 04/20/14   Hoy Morn, MD   BP 116/63 mmHg  Pulse 76  Temp(Src) 97.6 F (36.4 C) (Oral)  Resp 18  Ht 6\' 4"  (1.93 m)  Wt 245 lb (111.131 kg)  BMI 29.83 kg/m2  SpO2 98% Physical Exam  Constitutional: He is oriented to person, place, and time. He appears well-developed and well-nourished.  HENT:  Head:  Normocephalic and atraumatic.  Eyes: EOM are normal.  Neck: Normal range of motion.  Cardiovascular: Normal rate, regular rhythm, normal heart sounds and intact distal pulses.   Pulmonary/Chest: Effort normal and breath sounds normal. No respiratory distress.  Abdominal: Soft. He exhibits no distension. There is no tenderness.  Musculoskeletal: Normal range of motion.  Neurological: He is alert and oriented to person, place, and time.  Skin: Skin is warm and dry.  Psychiatric: He has a normal mood and affect. Judgment normal.  Nursing note and vitals reviewed.   ED Course  Procedures (including critical care time) Labs Review Labs Reviewed  URINALYSIS, ROUTINE W REFLEX MICROSCOPIC - Abnormal; Notable for the following:    Hgb urine dipstick LARGE (*)    All other components within normal limits  CBC WITH DIFFERENTIAL - Abnormal; Notable for the following:    RBC 3.46 (*)    Hemoglobin 11.3 (*)    HCT 33.2 (*)    All other components within normal limits  BASIC METABOLIC PANEL - Abnormal; Notable for the following:    Glucose, Bld 126 (*)    BUN 26 (*)    GFR calc non Af Amer 64 (*)    GFR calc Af Amer 74 (*)    All other components within normal limits  URINE MICROSCOPIC-ADD ON    Imaging Review Ct Abdomen Pelvis Wo Contrast  04/20/2014   CLINICAL DATA:  Initial evaluation for acute right flank pain.  EXAM: CT ABDOMEN AND PELVIS WITHOUT CONTRAST  TECHNIQUE: Multidetector CT imaging of the abdomen and pelvis was performed following the standard protocol without IV contrast.  COMPARISON:  Prior study from 05/04/2013  FINDINGS: The visualized lung bases are clear.  Cardiomegaly noted.  Limited noncontrast evaluation of the liver is unremarkable. Gallbladder within normal limits. No biliary dilatation. Scattered calcified granulomas present within the spleen. Spleen is otherwise unremarkable. Adrenal glands and pancreas are within normal limits.  There is an obstructive 5 mm calculus  noted within the bladder lumen near the right UVJ. There is secondary moderate right hydroureteronephrosis. No other stone seen along the course of the right ureter. Additional nonobstructive calculi measuring up to 9 mm present within the right kidney.  Multiple nonobstructive calculi measuring up to 1.2 cm present within the left kidney. No evidence for left-sided obstructive uropathy. Left ureter itself is mildly patulous.  Stomach within normal limits. No evidence for bowel obstruction. No abnormal wall thickening or acute inflammatory fat stranding seen about the bowels. Appendix not visualized, compatible with history prior appendectomy.  Small fat containing paraumbilical hernia noted.  Bladder within normal limits.  Prostate are unremarkable.  No free air or fluid. Tortuosity the intra-abdominal aorta present with extensive vascular calcifications. No aneurysm.  Scoliosis with some multilevel degenerative disc disease seen within the visualized spine. No acute osseous abnormality. No worrisome lytic or blastic osseous lesion.  IMPRESSION: 1. Obstructive 5 mm stone within the bladder lumen near the right UVJ with secondary moderate right hydroureteronephrosis. 2. Additional bilateral nonobstructive nephrolithiasis as above. 3. Cardiomegaly with extensive vascular calcifications within the intra-abdominal aorta. No aneurysm. 4. Scoliosis with extensive multilevel degenerative changes within the visualized spine.   Electronically Signed   By: Jeannine Boga M.D.   On: 04/20/2014 04:52  I personally reviewed the imaging tests through PACS system I reviewed available ER/hospitalization records through the EMR    EKG Interpretation None      MDM   Final diagnoses:  Right flank pain  Right distal ureteral calculus    5:41 AM Patient feels much better at this time.  Discharge home in good condition.  Outpatient urology follow-up.  Patient understands to return to the ER for new or worsening  symptoms    Hoy Morn, MD 04/20/14 [phone]

## 2014-04-20 NOTE — Discharge Instructions (Signed)
Ureteral Colic (Kidney Stones) °Ureteral colic is the result of a condition when kidney stones form inside the kidney. Once kidney stones are formed they may move into the tube that connects the kidney with the bladder (ureter). If this occurs, this condition may cause pain (colic) in the ureter.  °CAUSES  °Pain is caused by stone movement in the ureter and the obstruction caused by the stone. °SYMPTOMS  °The pain comes and goes as the ureter contracts around the stone. The pain is usually intense, sharp, and stabbing in character. The location of the pain may move as the stone moves through the ureter. When the stone is near the kidney the pain is usually located in the back and radiates to the belly (abdomen). When the stone is ready to pass into the bladder the pain is often located in the lower abdomen on the side the stone is located. At this location, the symptoms may mimic those of a urinary tract infection with urinary frequency. Once the stone is located here it often passes into the bladder and the pain disappears completely. °TREATMENT  °· Your caregiver will provide you with medicine for pain relief. °· You may require specialized follow-up X-rays. °· The absence of pain does not always mean that the stone has passed. It may have just stopped moving. If the urine remains completely obstructed, it can cause loss of kidney function or even complete destruction of the involved kidney. It is your responsibility and in your interest that X-rays and follow-ups as suggested by your caregiver are completed. Relief of pain without passage of the stone can be associated with severe damage to the kidney, including loss of kidney function on that side. °· If your stone does not pass on its own, additional measures may be taken by your caregiver to ensure its removal. °HOME CARE INSTRUCTIONS  °· Increase your fluid intake. Water is the preferred fluid since juices containing vitamin C may acidify the urine making it  less likely for certain stones (uric acid stones) to pass. °· Strain all urine. A strainer will be provided. Keep all particulate matter or stones for your caregiver to inspect. °· Take your pain medicine as directed. °· Make a follow-up appointment with your caregiver as directed. °· Remember that the goal is passage of your stone. The absence of pain does not mean the stone is gone. Follow your caregiver's instructions. °· Only take over-the-counter or prescription medicines for pain, discomfort, or fever as directed by your caregiver. °SEEK MEDICAL CARE IF:  °· Pain cannot be controlled with the prescribed medicine. °· You have a fever. °· Pain continues for longer than your caregiver advises it should. °· There is a change in the pain, and you develop chest discomfort or constant abdominal pain. °· You feel faint or pass out. °MAKE SURE YOU:  °· Understand these instructions. °· Will watch your condition. °· Will get help right away if you are not doing well or get worse. °Document Released: 03/11/2005 Document Revised: 09/26/2012 Document Reviewed: 11/26/2010 °[employer]® Patient Information ©2015 [employer]. This information is not intended to replace advice given to you by your health care provider. Make sure you discuss any questions you have with your health care provider. ° °

## 2014-04-20 NOTE — ED Notes (Signed)
Patient reports pain is similar to previous kidney stones.

## 2014-04-20 NOTE — ED Notes (Signed)
Patient transported to CT 

## 2014-05-01 ENCOUNTER — Ambulatory Visit (INDEPENDENT_AMBULATORY_CARE_PROVIDER_SITE_OTHER): Payer: Medicare Other | Admitting: Internal Medicine

## 2014-05-01 ENCOUNTER — Encounter: Payer: Self-pay | Admitting: Internal Medicine

## 2014-05-01 VITALS — BP 112/68 | HR 87 | Ht 76.0 in | Wt 245.8 lb

## 2014-05-01 DIAGNOSIS — Z950 Presence of cardiac pacemaker: Secondary | ICD-10-CM

## 2014-05-01 DIAGNOSIS — I5032 Chronic diastolic (congestive) heart failure: Secondary | ICD-10-CM

## 2014-05-01 DIAGNOSIS — I495 Sick sinus syndrome: Secondary | ICD-10-CM

## 2014-05-01 DIAGNOSIS — I4891 Unspecified atrial fibrillation: Secondary | ICD-10-CM

## 2014-05-01 LAB — MDC_IDC_ENUM_SESS_TYPE_INCLINIC
Battery Impedance: 2700 Ohm
Brady Statistic RV Percent Paced: 99 %
Implantable Pulse Generator Model: 5626
Implantable Pulse Generator Serial Number: 2041208
Lead Channel Impedance Value: 561 Ohm
Lead Channel Pacing Threshold Pulse Width: 0.5 ms
Lead Channel Setting Pacing Amplitude: 0.5 V
Lead Channel Setting Pacing Pulse Width: 0.5 ms
MDC IDC MSMT BATTERY VOLTAGE: 2.76 V
MDC IDC MSMT LEADCHNL RV PACING THRESHOLD AMPLITUDE: 0.5 V
MDC IDC SESS DTM: 20151117092935
MDC IDC SET LEADCHNL RV SENSING SENSITIVITY: 2 mV

## 2014-05-01 NOTE — Progress Notes (Signed)
HPI [name] returns today for followup. He is a very pleasant 80 year old man with symptomatic bradycardia, complete heart block, atrial fibrillation, status post permanent pacemaker insertion. He has had worsening sob and chest pressure over the past few months. He had an echo in January which demonstrated mild/moderate aortic stenosis. He notes that his chest pressure occurs with exertion of any kind and his symptoms are class 2. He underwent heart catheterization several weeks ago and returns today for followup. In the interim, he still has sob but thinks that his symptoms are slightly better. He has persistent peripheral edema. 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 Allergies  Allergen Reactions  . Peanut-Containing Drug Products Anaphylaxis and Other (See Comments)    ONLY WALNUTS  . Nisoldipine Hives and Nausea And Vomiting  . Procardia [Nifedipine] Other (See Comments)    Headaches  . Sular [Nisoldipine Er] Hives and Nausea And Vomiting  . Viagra [Sildenafil Citrate] Other (See Comments)    Headaches  . Isosorbide Mononitrate Rash  . Sulfa Antibiotics Rash  . Vioxx [Rofecoxib] Palpitations  . Zithromax [Azithromycin] Rash     Current Outpatient Prescriptions  Medication Sig Dispense Refill  . ACCU-CHEK AVIVA PLUS test strip     . acetaminophen (TYLENOL) 500 MG tablet Take 1,000 mg by mouth every 6 (six) hours as needed for mild pain.    Marland Kitchen atorvastatin (LIPITOR) 40 MG tablet Take 40 mg by mouth daily.    . cholecalciferol (VITAMIN D) 400 UNITS TABS Take 400 Units by mouth every evening.     . Cinnamon 500 MG capsule Take 500 mg by mouth 2 (two) times daily.     . digoxin (LANOXIN) 0.125 MG tablet Take 1 tablet (0.125 mg total) by mouth daily. 90 tablet 3  . diltiazem (CARDIZEM CD) 180 MG 24 hr capsule Take 1 capsule (180 mg total) by mouth at bedtime. 90 capsule 3  . doxazosin (CARDURA) 4 MG tablet Take 2 mg by mouth at bedtime.     . finasteride  (PROSCAR) 5 MG tablet Take 5 mg by mouth daily.     . folic acid (FOLVITE) 027 MCG tablet Take 400 mcg by mouth every evening.     . furosemide (LASIX) 40 MG tablet Take 1 tablet (40 mg total) by mouth 2 (two) times daily. 180 tablet 3  . glucosamine-chondroitin 500-400 MG tablet Take 1 tablet by mouth 2 (two) times daily.     . insulin aspart (NOVOLOG) 100 UNIT/ML injection Inject 10 Units into the skin every evening.     . insulin glargine (LANTUS) 100 UNIT/ML injection Inject 52 Units into the skin daily.     . metoprolol (LOPRESSOR) 100 MG tablet Take 100 mg by mouth 2 (two) times daily.     . Misc Natural Products (APPLE CIDER VINEGAR) TABS Take 1 tablet by mouth 2 (two) times daily.     . nitroGLYCERIN (NITROSTAT) 0.4 MG SL tablet Place 0.4 mg under the tongue every 5 (five) minutes as needed. CHEST PAIN     . NON FORMULARY O2 AT NIGHT    . omeprazole (PRILOSEC) 20 MG capsule Take 20 mg by mouth 2 (two) times daily.     . ondansetron (ZOFRAN ODT) 8 MG disintegrating tablet Take 1 tablet (8 mg total) by mouth every 8 (eight) hours as needed for nausea or vomiting. 10 tablet 0  . oxyCODONE-acetaminophen (PERCOCET/ROXICET) 5-325 MG per tablet Take 1 tablet by mouth every 4 (four) hours as  needed for severe pain. 20 tablet 0  . potassium chloride SA (KLOR-CON M20) 20 MEQ tablet Take 1 tablet (20 mEq total) by mouth daily. 90 tablet 3  . sitaGLIPtan-metformin (JANUMET) 50-500 MG per tablet Take 1 tablet by mouth 2 (two) times daily with a meal.     . vitamin B-12 (CYANOCOBALAMIN) 1000 MCG tablet Take 1,000 mcg by mouth every evening.    . vitamin C (ASCORBIC ACID) 500 MG tablet Take 500 mg by mouth every evening.     . warfarin (COUMADIN) 5 MG tablet Take 5 mg by mouth daily at 6 PM.      No current facility-administered medications for this visit.     Past Medical History  Diagnosis Date  . Angina   . Coronary artery disease   . Heart murmur   . Hypertension     hyperlipidemia  .  Shortness of breath     with exertion  . Recurrent upper respiratory infection (URI)     statrted with cold symptoms 05/30/11- large amount clear drainage out of nose, with cough- non productive- states chest hurts from coughing so much.  ?fever last PM  . Sleep apnea     last study years ago- SEVERS PER PATIENT- does not wear mask  . Chronic kidney disease   . Renal lithiasis     states in both kidneys, hematuria  . H/O hiatal hernia   . Headache(784.0)     history of migraines  . Stroke     3 mini strokes-last one 1 1/2 yrs ago  . Cancer     skin cancer left ear  . Arthritis   . 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 Dysrhythmia     atrial fibrilliation,s/p PPM,bradycardia s/p PPM, hx tachy-brady syndrome. LAST OV WITH INTERROGATION ON CHART. Clearance Dr Lovena Le with ok to stop coumadin 5 days pre op on chart  . Myocardial infarction     states silent heart attack years ago, but no damage-- stress test 2009 on chart  . Hematuria   . Syncope and collapse 05/26/13    ER visit 05/26/13 due to syncope. Possibly due to doxazosin.  . Diabetes mellitus with neurological manifestations, uncontrolled   . Edema   . Impotence of organic origin     ED  . Peptic ulcer, unspecified site, unspecified as acute or chronic, without mention of hemorrhage or perforation, with obstruction   . Pure hypercholesterolemia   . Chronic ischemic heart disease, unspecified   . Anemia, unspecified   . Asthmatic bronchitis   . AAA (abdominal aortic aneurysm)   . Carotid stenosis   . BPH (benign prostatic hyperplasia)   . Hemorrhoids   . LBBB (left bundle branch block)   . Eczema   . Actinic keratoses   . Nephrolithiasis   . Vitamin B12 deficiency   . Allergic rhinitis   . History of MI (myocardial infarction)   . Osteoarthritis   . Esophageal reflux   . CHF (congestive heart failure)   . History of TIA (transient  ischemic attack)   . Polymyalgia rheumatica   . Low back pain   . Hard of hearing   . Anaphylactic reaction     Tree nut (WALNUT ONLY)  . Coronary artery ectasia   . Carotid bruit   . Stable angina   . Dyspnea   . At high risk for falls   .  Syncope     ROS:   All systems reviewed and negative except as noted in the HPI.   Past Surgical History  Procedure Laterality Date  . Insert / replace / remove pacemaker      St Jude- last interrogation 7/12- followed by Dr Lovena Le  . Cardiac catheterization      x 2 -years ago  . Hemorrhoid surgery    . Eye surgery      bilateral cataract extraction with IOL  . Appendectomy    . Hernia repair      bilateral inguinal  . Rotator cuff repair      right  . Doppler echocardiography  2009  . Ureteroscopy  06/24/2011    Procedure: URETEROSCOPY;  Surgeon: Molli Hazard, MD;  Location: [facility];  Service: Urology;  Laterality: Left;  . Cystoscopy w/ retrogrades  06/24/2011    Procedure: CYSTOSCOPY WITH RETROGRADE PYELOGRAM;  Surgeon: Molli Hazard, MD;  Location: [facility];  Service: Urology;  Laterality: Left;  . Cardioversion  03/1998     Family History  Problem Relation Age of Onset  . Heart disease Mother   . CVA Father      History   Social History  . Marital Status: Married    Spouse Name: N/A    Number of Children: N/A  . Years of Education: N/A   Occupational History  . Not on file.   Social History Main Topics  . Smoking status: Former Smoker -- 30 years    Types: Cigarettes, Pipe, Cigars    Quit date: 06/01/1987  . Smokeless tobacco: Never Used  . Alcohol Use: No  . Drug Use: No  . Sexual Activity: Not on file   Other Topics Concern  . Not on file   Social History Narrative     BP 112/68 mmHg  Pulse 87  Ht 6\' 4"  (1.93 m)  Wt 245 lb 12.8 oz (111.494 kg)  BMI 29.93 kg/m2  SpO2 97%  Physical Exam:  stable appearing elderly man, NAD HEENT: Unremarkable Neck:  7 cm JVD, no  thyromegally Lungs:  Clear with no wheezes, rales, or rhonchi. HEART:  Regular rate rhythm, 2/6 systolic murmur c/w AS, and a 2/6 systolic murmur at the base c/w mitral regurge. Abd:  soft, positive bowel sounds, no organomegally, no rebound, no guarding Ext:  2 plus pulses, no edema, no cyanosis, no clubbing Skin:  No rashes no nodules Neuro:  CN II through XII intact, motor grossly intact   DEVICE  Normal device function.  See PaceArt for details. V. Pacing 99%  Assess/Plan:

## 2014-05-01 NOTE — Assessment & Plan Note (Signed)
His symptoms remain class II. He admits to some sodium indiscretion. He will continue his current medical therapy. I've asked the patient to reduce his salt intake.

## 2014-05-01 NOTE — Patient Instructions (Signed)
Your physician wants you to follow-up in: 6 months with device clinic and 12 months with Dr Taylor You will receive a reminder letter in the mail two months in advance. If you don't receive a letter, please call our office to schedule the follow-up appointment.  

## 2014-05-01 NOTE — Assessment & Plan Note (Signed)
His St. Jude single chamber pacemaker is working normally. We'll plan to recheck in several months.

## 2014-05-01 NOTE — Assessment & Plan Note (Signed)
His ventricular rate is well controlled. No change in meds. 

## 2014-05-24 ENCOUNTER — Encounter (HOSPITAL_COMMUNITY): Payer: Self-pay | Admitting: Cardiology

## 2014-06-17 DIAGNOSIS — I10 Essential (primary) hypertension: Secondary | ICD-10-CM | POA: Diagnosis not present

## 2014-06-17 DIAGNOSIS — I259 Chronic ischemic heart disease, unspecified: Secondary | ICD-10-CM | POA: Diagnosis not present

## 2014-06-17 DIAGNOSIS — I209 Angina pectoris, unspecified: Secondary | ICD-10-CM | POA: Diagnosis not present

## 2014-06-28 DIAGNOSIS — Z7901 Long term (current) use of anticoagulants: Secondary | ICD-10-CM | POA: Diagnosis not present

## 2014-06-28 DIAGNOSIS — I482 Chronic atrial fibrillation: Secondary | ICD-10-CM | POA: Diagnosis not present

## 2014-07-18 DIAGNOSIS — I209 Angina pectoris, unspecified: Secondary | ICD-10-CM | POA: Diagnosis not present

## 2014-07-18 DIAGNOSIS — I259 Chronic ischemic heart disease, unspecified: Secondary | ICD-10-CM | POA: Diagnosis not present

## 2014-07-18 DIAGNOSIS — I10 Essential (primary) hypertension: Secondary | ICD-10-CM | POA: Diagnosis not present

## 2014-07-26 DIAGNOSIS — Z7901 Long term (current) use of anticoagulants: Secondary | ICD-10-CM | POA: Diagnosis not present

## 2014-07-26 DIAGNOSIS — I482 Chronic atrial fibrillation: Secondary | ICD-10-CM | POA: Diagnosis not present

## 2014-08-16 DIAGNOSIS — I10 Essential (primary) hypertension: Secondary | ICD-10-CM | POA: Diagnosis not present

## 2014-08-16 DIAGNOSIS — I259 Chronic ischemic heart disease, unspecified: Secondary | ICD-10-CM | POA: Diagnosis not present

## 2014-08-16 DIAGNOSIS — I209 Angina pectoris, unspecified: Secondary | ICD-10-CM | POA: Diagnosis not present

## 2014-08-23 ENCOUNTER — Inpatient Hospital Stay (HOSPITAL_COMMUNITY)
Admission: EM | Admit: 2014-08-23 | Discharge: 2014-08-25 | DRG: 638 | Disposition: A | Discharge status: Home / Self Care | Payer: Medicare Other | Attending: Internal Medicine | Admitting: Internal Medicine

## 2014-08-23 ENCOUNTER — Encounter (HOSPITAL_COMMUNITY): Payer: Self-pay

## 2014-08-23 ENCOUNTER — Emergency Department (HOSPITAL_COMMUNITY): Payer: Medicare Other

## 2014-08-23 DIAGNOSIS — I447 Left bundle-branch block, unspecified: Secondary | ICD-10-CM | POA: Diagnosis not present

## 2014-08-23 DIAGNOSIS — I495 Sick sinus syndrome: Secondary | ICD-10-CM

## 2014-08-23 DIAGNOSIS — Z8673 Personal history of transient ischemic attack (TIA), and cerebral infarction without residual deficits: Secondary | ICD-10-CM | POA: Diagnosis not present

## 2014-08-23 DIAGNOSIS — I350 Nonrheumatic aortic (valve) stenosis: Secondary | ICD-10-CM | POA: Diagnosis not present

## 2014-08-23 DIAGNOSIS — Z9841 Cataract extraction status, right eye: Secondary | ICD-10-CM | POA: Diagnosis not present

## 2014-08-23 DIAGNOSIS — Z85828 Personal history of other malignant neoplasm of skin: Secondary | ICD-10-CM | POA: Diagnosis not present

## 2014-08-23 DIAGNOSIS — Z794 Long term (current) use of insulin: Secondary | ICD-10-CM

## 2014-08-23 DIAGNOSIS — I129 Hypertensive chronic kidney disease with stage 1 through stage 4 chronic kidney disease, or unspecified chronic kidney disease: Secondary | ICD-10-CM | POA: Diagnosis present

## 2014-08-23 DIAGNOSIS — M1990 Unspecified osteoarthritis, unspecified site: Secondary | ICD-10-CM | POA: Diagnosis present

## 2014-08-23 DIAGNOSIS — E785 Hyperlipidemia, unspecified: Secondary | ICD-10-CM | POA: Diagnosis not present

## 2014-08-23 DIAGNOSIS — S0990XA Unspecified injury of head, initial encounter: Secondary | ICD-10-CM | POA: Diagnosis not present

## 2014-08-23 DIAGNOSIS — Z87891 Personal history of nicotine dependence: Secondary | ICD-10-CM

## 2014-08-23 DIAGNOSIS — I252 Old myocardial infarction: Secondary | ICD-10-CM | POA: Diagnosis not present

## 2014-08-23 DIAGNOSIS — R0789 Other chest pain: Secondary | ICD-10-CM

## 2014-08-23 DIAGNOSIS — J90 Pleural effusion, not elsewhere classified: Secondary | ICD-10-CM | POA: Diagnosis not present

## 2014-08-23 DIAGNOSIS — Z950 Presence of cardiac pacemaker: Secondary | ICD-10-CM | POA: Diagnosis not present

## 2014-08-23 DIAGNOSIS — E872 Acidosis, unspecified: Secondary | ICD-10-CM | POA: Diagnosis present

## 2014-08-23 DIAGNOSIS — E160 Drug-induced hypoglycemia without coma: Secondary | ICD-10-CM

## 2014-08-23 DIAGNOSIS — I5032 Chronic diastolic (congestive) heart failure: Secondary | ICD-10-CM | POA: Diagnosis present

## 2014-08-23 DIAGNOSIS — E876 Hypokalemia: Secondary | ICD-10-CM | POA: Diagnosis present

## 2014-08-23 DIAGNOSIS — R778 Other specified abnormalities of plasma proteins: Secondary | ICD-10-CM

## 2014-08-23 DIAGNOSIS — E860 Dehydration: Secondary | ICD-10-CM | POA: Diagnosis present

## 2014-08-23 DIAGNOSIS — R40243 Glasgow coma scale score 3-8: Secondary | ICD-10-CM | POA: Diagnosis not present

## 2014-08-23 DIAGNOSIS — D649 Anemia, unspecified: Secondary | ICD-10-CM | POA: Diagnosis not present

## 2014-08-23 DIAGNOSIS — I25119 Atherosclerotic heart disease of native coronary artery with unspecified angina pectoris: Secondary | ICD-10-CM | POA: Diagnosis not present

## 2014-08-23 DIAGNOSIS — E11649 Type 2 diabetes mellitus with hypoglycemia without coma: Secondary | ICD-10-CM | POA: Diagnosis present

## 2014-08-23 DIAGNOSIS — R569 Unspecified convulsions: Secondary | ICD-10-CM

## 2014-08-23 DIAGNOSIS — T68XXXA Hypothermia, initial encounter: Secondary | ICD-10-CM | POA: Diagnosis not present

## 2014-08-23 DIAGNOSIS — R7989 Other specified abnormal findings of blood chemistry: Secondary | ICD-10-CM

## 2014-08-23 DIAGNOSIS — Z7901 Long term (current) use of anticoagulants: Secondary | ICD-10-CM | POA: Diagnosis not present

## 2014-08-23 DIAGNOSIS — N179 Acute kidney failure, unspecified: Secondary | ICD-10-CM | POA: Diagnosis not present

## 2014-08-23 DIAGNOSIS — I4891 Unspecified atrial fibrillation: Secondary | ICD-10-CM | POA: Diagnosis not present

## 2014-08-23 DIAGNOSIS — N189 Chronic kidney disease, unspecified: Secondary | ICD-10-CM | POA: Diagnosis not present

## 2014-08-23 DIAGNOSIS — Z9842 Cataract extraction status, left eye: Secondary | ICD-10-CM | POA: Diagnosis not present

## 2014-08-23 DIAGNOSIS — I442 Atrioventricular block, complete: Secondary | ICD-10-CM | POA: Diagnosis not present

## 2014-08-23 DIAGNOSIS — T383X5A Adverse effect of insulin and oral hypoglycemic [antidiabetic] drugs, initial encounter: Secondary | ICD-10-CM | POA: Diagnosis not present

## 2014-08-23 DIAGNOSIS — R55 Syncope and collapse: Secondary | ICD-10-CM

## 2014-08-23 DIAGNOSIS — S199XXA Unspecified injury of neck, initial encounter: Secondary | ICD-10-CM | POA: Diagnosis not present

## 2014-08-23 LAB — URINALYSIS, ROUTINE W REFLEX MICROSCOPIC
BILIRUBIN URINE: NEGATIVE
Glucose, UA: 100 mg/dL — AB
Ketones, ur: NEGATIVE mg/dL
Leukocytes, UA: NEGATIVE
Nitrite: NEGATIVE
Protein, ur: NEGATIVE mg/dL
SPECIFIC GRAVITY, URINE: 1.013 (ref 1.005–1.030)
Urobilinogen, UA: 0.2 mg/dL (ref 0.0–1.0)
pH: 7 (ref 5.0–8.0)

## 2014-08-23 LAB — CBG MONITORING, ED
GLUCOSE-CAPILLARY: 125 mg/dL — AB (ref 70–99)
GLUCOSE-CAPILLARY: 34 mg/dL — AB (ref 70–99)
Glucose-Capillary: 94 mg/dL (ref 70–99)
Glucose-Capillary: 208 mg/dL — ABNORMAL HIGH (ref 70–99)

## 2014-08-23 LAB — GLUCOSE, CAPILLARY
GLUCOSE-CAPILLARY: 115 mg/dL — AB (ref 70–99)
GLUCOSE-CAPILLARY: 317 mg/dL — AB (ref 70–99)
Glucose-Capillary: 238 mg/dL — ABNORMAL HIGH (ref 70–99)

## 2014-08-23 LAB — TSH: TSH: 1.004 u[IU]/mL (ref 0.350–4.500)

## 2014-08-23 LAB — BASIC METABOLIC PANEL
Anion gap: 11 (ref 5–15)
BUN: 20 mg/dL (ref 6–23)
CO2: 26 mmol/L (ref 19–32)
CREATININE: 1 mg/dL (ref 0.50–1.35)
Calcium: 9 mg/dL (ref 8.4–10.5)
Chloride: 104 mmol/L (ref 96–112)
GFR calc Af Amer: 79 mL/min — ABNORMAL LOW (ref >90)
GFR calc non Af Amer: 68 mL/min — ABNORMAL LOW (ref >90)
Glucose, Bld: 171 mg/dL — ABNORMAL HIGH (ref 70–99)
Potassium: 2.4 mmol/L — CL (ref 3.5–5.1)
SODIUM: 141 mmol/L (ref 135–145)

## 2014-08-23 LAB — CBC WITH DIFFERENTIAL/PLATELET
Basophils Absolute: 0 10*3/uL (ref 0.0–0.1)
Basophils Relative: 0 % (ref 0–1)
Eosinophils Absolute: 0.1 10*3/uL (ref 0.0–0.7)
Eosinophils Relative: 1 % (ref 0–5)
HCT: 33.1 % — ABNORMAL LOW (ref 39.0–52.0)
Hemoglobin: 11.3 g/dL — ABNORMAL LOW (ref 13.0–17.0)
Lymphocytes Relative: 17 % (ref 12–46)
Lymphs Abs: 1.5 10*3/uL (ref 0.7–4.0)
MCH: 32.8 pg (ref 26.0–34.0)
MCHC: 34.1 g/dL (ref 30.0–36.0)
MCV: 95.9 fL (ref 78.0–100.0)
MONOS PCT: 10 % (ref 3–12)
Monocytes Absolute: 0.9 10*3/uL (ref 0.1–1.0)
Neutro Abs: 6.4 10*3/uL (ref 1.7–7.7)
Neutrophils Relative %: 72 % (ref 43–77)
PLATELETS: 144 10*3/uL — AB (ref 150–400)
RBC: 3.45 MIL/uL — AB (ref 4.22–5.81)
RDW: 14.5 % (ref 11.5–15.5)
WBC: 8.8 10*3/uL (ref 4.0–10.5)

## 2014-08-23 LAB — TROPONIN I
TROPONIN I: 0.05 ng/mL — AB (ref <0.031)
Troponin I: 0.06 ng/mL — ABNORMAL HIGH (ref <0.031)

## 2014-08-23 LAB — HEPATIC FUNCTION PANEL
ALBUMIN: 3.4 g/dL — AB (ref 3.5–5.2)
ALK PHOS: 63 U/L (ref 39–117)
ALT: 25 U/L (ref 0–53)
AST: 26 U/L (ref 0–37)
BILIRUBIN TOTAL: 0.8 mg/dL (ref 0.3–1.2)
Bilirubin, Direct: 0.2 mg/dL (ref 0.0–0.5)
Indirect Bilirubin: 0.6 mg/dL (ref 0.3–0.9)
Total Protein: 5.9 g/dL — ABNORMAL LOW (ref 6.0–8.3)

## 2014-08-23 LAB — MRSA PCR SCREENING: MRSA BY PCR: NEGATIVE

## 2014-08-23 LAB — URINE MICROSCOPIC-ADD ON

## 2014-08-23 LAB — I-STAT CG4 LACTIC ACID, ED
Lactic Acid, Venous: 2.57 mmol/L (ref 0.5–2.0)
Lactic Acid, Venous: 1.88 mmol/L (ref 0.5–2.0)

## 2014-08-23 LAB — I-STAT TROPONIN, ED
Troponin i, poc: 0.02 ng/mL (ref 0.00–0.08)
Troponin i, poc: 0.05 ng/mL (ref 0.00–0.08)

## 2014-08-23 LAB — PROTIME-INR
INR: 2 — AB (ref 0.00–1.49)
Prothrombin Time: 22.8 seconds — ABNORMAL HIGH (ref 11.6–15.2)

## 2014-08-23 LAB — CK: Total CK: 107 U/L (ref 7–232)

## 2014-08-23 LAB — DIGOXIN LEVEL: DIGOXIN LVL: 0.3 ng/mL — AB (ref 0.8–2.0)

## 2014-08-23 MED ORDER — INSULIN GLARGINE 100 UNIT/ML ~~LOC~~ SOLN
45.0000 [IU] | Freq: Every day | SUBCUTANEOUS | Status: DC
  Administered 2014-08-24 – 2014-08-25 (×2): 45 [IU] via SUBCUTANEOUS
  Filled 2014-08-23 (×2): qty 0.45

## 2014-08-23 MED ORDER — WARFARIN SODIUM 5 MG PO TABS
5.0000 mg | ORAL_TABLET | Freq: Every day | ORAL | Status: DC
  Administered 2014-08-23: 5 mg via ORAL
  Filled 2014-08-23 (×2): qty 1

## 2014-08-23 MED ORDER — HYDROCODONE-ACETAMINOPHEN 5-325 MG PO TABS: 1.0000 | ORAL_TABLET | ORAL | Status: DC | PRN

## 2014-08-23 MED ORDER — VITAMIN B-12 1000 MCG PO TABS
1000.0000 ug | ORAL_TABLET | Freq: Two times a day (BID) | ORAL | Status: DC
  Administered 2014-08-23 – 2014-08-25 (×4): 1000 ug via ORAL
  Filled 2014-08-23 (×5): qty 1

## 2014-08-23 MED ORDER — FOLIC ACID 400 MCG PO TABS: 400.0000 ug | ORAL_TABLET | Freq: Every evening | ORAL | Status: DC

## 2014-08-23 MED ORDER — FOLIC ACID 0.5 MG HALF TAB
0.5000 mg | ORAL_TABLET | Freq: Every day | ORAL | Status: DC
  Administered 2014-08-23 – 2014-08-25 (×3): 0.5 mg via ORAL
  Filled 2014-08-23 (×3): qty 1

## 2014-08-23 MED ORDER — PANTOPRAZOLE SODIUM 40 MG PO TBEC
40.0000 mg | DELAYED_RELEASE_TABLET | Freq: Every day | ORAL | Status: DC
  Administered 2014-08-23 – 2014-08-25 (×3): 40 mg via ORAL
  Filled 2014-08-23 (×2): qty 1

## 2014-08-23 MED ORDER — DIGOXIN 125 MCG PO TABS
0.1250 mg | ORAL_TABLET | Freq: Every day | ORAL | Status: DC
  Administered 2014-08-23 – 2014-08-25 (×3): 0.125 mg via ORAL
  Filled 2014-08-23 (×3): qty 1

## 2014-08-23 MED ORDER — SODIUM CHLORIDE 0.9 % IJ SOLN
3.0000 mL | Freq: Two times a day (BID) | INTRAMUSCULAR | Status: DC
  Administered 2014-08-23 – 2014-08-24 (×2): 3 mL via INTRAVENOUS

## 2014-08-23 MED ORDER — ATORVASTATIN CALCIUM 20 MG PO TABS
20.0000 mg | ORAL_TABLET | Freq: Every day | ORAL | Status: DC
  Administered 2014-08-23 – 2014-08-24 (×2): 20 mg via ORAL
  Filled 2014-08-23 (×3): qty 1

## 2014-08-23 MED ORDER — NITROGLYCERIN 0.4 MG SL SUBL: 0.4000 mg | SUBLINGUAL_TABLET | SUBLINGUAL | Status: DC | PRN

## 2014-08-23 MED ORDER — ACETAMINOPHEN 325 MG PO TABS: 650.0000 mg | ORAL_TABLET | Freq: Four times a day (QID) | ORAL | Status: DC | PRN

## 2014-08-23 MED ORDER — WARFARIN - PHARMACIST DOSING INPATIENT: Freq: Every day | Status: DC

## 2014-08-23 MED ORDER — FUROSEMIDE 40 MG PO TABS
40.0000 mg | ORAL_TABLET | Freq: Two times a day (BID) | ORAL | Status: DC
  Administered 2014-08-23 – 2014-08-25 (×4): 40 mg via ORAL
  Filled 2014-08-23 (×6): qty 1

## 2014-08-23 MED ORDER — ALUM & MAG HYDROXIDE-SIMETH 200-200-20 MG/5ML PO SUSP: 30.0000 mL | Freq: Four times a day (QID) | ORAL | Status: DC | PRN

## 2014-08-23 MED ORDER — POTASSIUM CHLORIDE CRYS ER 20 MEQ PO TBCR
40.0000 meq | EXTENDED_RELEASE_TABLET | Freq: Once | ORAL | Status: AC
  Administered 2014-08-23: 40 meq via ORAL
  Filled 2014-08-23: qty 2

## 2014-08-23 MED ORDER — ENOXAPARIN SODIUM 40 MG/0.4ML ~~LOC~~ SOLN
40.0000 mg | SUBCUTANEOUS | Status: DC
  Filled 2014-08-23: qty 0.4

## 2014-08-23 MED ORDER — ONDANSETRON HCL 4 MG PO TABS
4.0000 mg | ORAL_TABLET | Freq: Four times a day (QID) | ORAL | Status: DC | PRN
Start: 2014-08-23 — End: 2014-08-25

## 2014-08-23 MED ORDER — POTASSIUM CHLORIDE 10 MEQ/100ML IV SOLN
10.0000 meq | INTRAVENOUS | Status: AC
  Administered 2014-08-23 (×4): 10 meq via INTRAVENOUS
  Filled 2014-08-23 (×4): qty 100

## 2014-08-23 MED ORDER — DILTIAZEM HCL ER COATED BEADS 180 MG PO CP24
180.0000 mg | ORAL_CAPSULE | Freq: Every day | ORAL | Status: DC
Start: 2014-08-23 — End: 2014-08-25
  Administered 2014-08-23 – 2014-08-24 (×2): 180 mg via ORAL
  Filled 2014-08-23 (×3): qty 1

## 2014-08-23 MED ORDER — ONDANSETRON HCL 4 MG/2ML IJ SOLN: 4.0000 mg | Freq: Four times a day (QID) | INTRAMUSCULAR | Status: DC | PRN

## 2014-08-23 MED ORDER — DOCUSATE SODIUM 100 MG PO CAPS
100.0000 mg | ORAL_CAPSULE | Freq: Two times a day (BID) | ORAL | Status: DC
  Administered 2014-08-23 – 2014-08-25 (×4): 100 mg via ORAL
  Filled 2014-08-23 (×5): qty 1

## 2014-08-23 MED ORDER — FINASTERIDE 5 MG PO TABS
5.0000 mg | ORAL_TABLET | Freq: Every day | ORAL | Status: DC
  Administered 2014-08-23 – 2014-08-25 (×3): 5 mg via ORAL
  Filled 2014-08-23 (×3): qty 1

## 2014-08-23 MED ORDER — DOXAZOSIN MESYLATE 2 MG PO TABS
2.0000 mg | ORAL_TABLET | Freq: Every day | ORAL | Status: DC
  Administered 2014-08-23 – 2014-08-24 (×2): 2 mg via ORAL
  Filled 2014-08-23 (×3): qty 1

## 2014-08-23 MED ORDER — TRAMADOL-ACETAMINOPHEN 37.5-325 MG PO TABS: 1.0000 | ORAL_TABLET | Freq: Four times a day (QID) | ORAL | Status: DC | PRN

## 2014-08-23 MED ORDER — ACETAMINOPHEN 650 MG RE SUPP: 650.0000 mg | Freq: Four times a day (QID) | RECTAL | Status: DC | PRN

## 2014-08-23 MED ORDER — METOPROLOL TARTRATE 100 MG PO TABS
100.0000 mg | ORAL_TABLET | Freq: Two times a day (BID) | ORAL | Status: DC
  Administered 2014-08-23 – 2014-08-25 (×4): 100 mg via ORAL
  Filled 2014-08-23 (×5): qty 1

## 2014-08-23 MED ORDER — POTASSIUM CHLORIDE CRYS ER 20 MEQ PO TBCR
20.0000 meq | EXTENDED_RELEASE_TABLET | Freq: Every day | ORAL | Status: DC
  Administered 2014-08-23 – 2014-08-25 (×3): 20 meq via ORAL
  Filled 2014-08-23 (×4): qty 1

## 2014-08-23 MED ORDER — CHOLECALCIFEROL 10 MCG (400 UNIT) PO TABS
400.0000 [IU] | ORAL_TABLET | Freq: Every evening | ORAL | Status: DC
  Administered 2014-08-23 – 2014-08-24 (×2): 400 [IU] via ORAL
  Filled 2014-08-23 (×3): qty 1

## 2014-08-23 MED ORDER — INSULIN ASPART 100 UNIT/ML ~~LOC~~ SOLN
0.0000 [IU] | SUBCUTANEOUS | Status: DC
  Administered 2014-08-23: 7 [IU] via SUBCUTANEOUS
  Administered 2014-08-23: 3 [IU] via SUBCUTANEOUS
  Administered 2014-08-23: 1 [IU] via SUBCUTANEOUS
  Administered 2014-08-24: 2 [IU] via SUBCUTANEOUS
  Administered 2014-08-24: 1 [IU] via SUBCUTANEOUS
  Administered 2014-08-24: 2 [IU] via SUBCUTANEOUS

## 2014-08-23 NOTE — ED Notes (Signed)
CBG 94 

## 2014-08-23 NOTE — ED Notes (Signed)
CBG: 115 

## 2014-08-23 NOTE — ED Provider Notes (Signed)
CSN: [id number]     Arrival date & time 08/23/14  0809 History   First MD Initiated Contact with Patient 08/23/14 [phone]     Chief Complaint  Patient presents with  . Hypoglycemia     (Consider location/radiation/quality/duration/timing/severity/associated sxs/prior Treatment) HPI Comments: Patient is an 81 year old male with an extensive past medical history including diabetes, CAD, hypertension, previous stroke, CHF, and AAA who presents to the ED with hypoglycemia. Patient reports taking 52 units of insulin this morning without eating breakfast per his usual routine. He was showering and felt lightheaded and called for his wife. The next thing he remembers was waking up in the living room with EMS standing over him. His CBG at that time was noted to be 17. Patient was given D50 and transported to the ED. Patient complains of moderate, aching neck pain since the fall. No radiation of pain. Neck movement makes the pain worse. No other associated symptoms.    Past Medical History  Diagnosis Date  . Angina   . Coronary artery disease   . Heart murmur   . Hypertension     hyperlipidemia  . Shortness of breath     with exertion  . Recurrent upper respiratory infection (URI)     statrted with cold symptoms 05/30/11- large amount clear drainage out of nose, with cough- non productive- states chest hurts from coughing so much.  ?fever last PM  . Sleep apnea     last study years ago- SEVERS PER PATIENT- does not wear mask  . Chronic kidney disease   . Renal lithiasis     states in both kidneys, hematuria  . H/O hiatal hernia   . Headache(784.0)     history of migraines  . Stroke     3 mini strokes-last one 1 1/2 yrs ago  . Cancer     skin cancer left ear  . Arthritis   . 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 Dysrhythmia     atrial fibrilliation,s/p PPM,bradycardia s/p PPM, hx tachy-brady syndrome. LAST OV  WITH INTERROGATION ON CHART. Clearance Dr Lovena Le with ok to stop coumadin 5 days pre op on chart  . Myocardial infarction     states silent heart attack years ago, but no damage-- stress test 2009 on chart  . Hematuria   . Syncope and collapse 05/26/13    ER visit 05/26/13 due to syncope. Possibly due to doxazosin.  . Diabetes mellitus with neurological manifestations, uncontrolled   . Edema   . Impotence of organic origin     ED  . Peptic ulcer, unspecified site, unspecified as acute or chronic, without mention of hemorrhage or perforation, with obstruction   . Pure hypercholesterolemia   . Chronic ischemic heart disease, unspecified   . Anemia, unspecified   . Asthmatic bronchitis   . AAA (abdominal aortic aneurysm)   . Carotid stenosis   . BPH (benign prostatic hyperplasia)   . Hemorrhoids   . LBBB (left bundle branch block)   . Eczema   . Actinic keratoses   . Nephrolithiasis   . Vitamin B12 deficiency   . Allergic rhinitis   . History of MI (myocardial infarction)   . Osteoarthritis   . Esophageal reflux   . CHF (congestive heart failure)   . History of TIA (transient ischemic attack)   . Polymyalgia rheumatica   . Low back pain   . Hard  of hearing   . Anaphylactic reaction     Tree nut (WALNUT ONLY)  . Coronary artery ectasia   . Carotid bruit   . Stable angina   . Dyspnea   . At high risk for falls   . Syncope    Past Surgical History  Procedure Laterality Date  . Insert / replace / remove pacemaker      [employer] Jude- last interrogation 7/12- followed by Dr Lovena Le  . Cardiac catheterization      x 2 -years ago  . Hemorrhoid surgery    . Eye surgery      bilateral cataract extraction with IOL  . Appendectomy    . Hernia repair      bilateral inguinal  . Rotator cuff repair      right  . Doppler echocardiography  2009  . Ureteroscopy  06/24/2011    Procedure: URETEROSCOPY;  Surgeon: Molli Hazard, MD;  Location: [facility];  Service: Urology;  Laterality:  Left;  . Cystoscopy w/ retrogrades  06/24/2011    Procedure: CYSTOSCOPY WITH RETROGRADE PYELOGRAM;  Surgeon: Molli Hazard, MD;  Location: [facility];  Service: Urology;  Laterality: Left;  . Cardioversion  03/1998  . Left and right heart catheterization with coronary angiogram N/A 09/22/2013    Procedure: LEFT AND RIGHT HEART CATHETERIZATION WITH CORONARY ANGIOGRAM ;  Surgeon: Peter M Martinique, MD;  Location: [facility] CATH LAB;  Service: Cardiovascular;  Laterality: N/A;   Family History  Problem Relation Age of Onset  . Heart disease Mother   . CVA Father    History  Substance Use Topics  . Smoking status: Former Smoker -- 30 years    Types: Cigarettes, Pipe, Cigars    Quit date: 06/01/1987  . Smokeless tobacco: Never Used  . Alcohol Use: No    Review of Systems  Constitutional: Negative for fever, chills and fatigue.  HENT: Negative for trouble swallowing.   Eyes: Negative for visual disturbance.  Respiratory: Negative for shortness of breath.   Cardiovascular: Negative for chest pain and palpitations.  Gastrointestinal: Negative for nausea, vomiting, abdominal pain and diarrhea.  Genitourinary: Negative for dysuria and difficulty urinating.  Musculoskeletal: Positive for neck pain. Negative for arthralgias.  Skin: Negative for color change.  Neurological: Positive for syncope. Negative for dizziness and weakness.  Psychiatric/Behavioral: Negative for dysphoric mood.      Allergies  Peanut-containing drug products; Nisoldipine; Procardia; Sular; Viagra; Isosorbide mononitrate; Sulfa antibiotics; Vioxx; and Zithromax  Home Medications   Prior to Admission medications   Medication Sig Start Date End Date Taking? Authorizing Provider  ACCU-CHEK AVIVA PLUS test strip  08/02/13   Historical Provider, MD  acetaminophen (TYLENOL) 500 MG tablet Take 1,000 mg by mouth every 6 (six) hours as needed for mild pain.    Historical Provider, MD  atorvastatin (LIPITOR) 40 MG tablet Take 40  mg by mouth daily.    Historical Provider, MD  cholecalciferol (VITAMIN D) 400 UNITS TABS Take 400 Units by mouth every evening.     Historical Provider, MD  Cinnamon 500 MG capsule Take 500 mg by mouth 2 (two) times daily.     Historical Provider, MD  digoxin (LANOXIN) 0.125 MG tablet Take 1 tablet (0.125 mg total) by mouth daily. 10/31/13   Evans Lance, MD  diltiazem (CARDIZEM CD) 180 MG 24 hr capsule Take 1 capsule (180 mg total) by mouth at bedtime. 10/31/13   Evans Lance, MD  doxazosin (CARDURA) 4 MG tablet Take 2 mg  by mouth at bedtime.     Historical Provider, MD  finasteride (PROSCAR) 5 MG tablet Take 5 mg by mouth daily.     Historical Provider, MD  folic acid (FOLVITE) 109 MCG tablet Take 400 mcg by mouth every evening.     Historical Provider, MD  furosemide (LASIX) 40 MG tablet Take 1 tablet (40 mg total) by mouth 2 (two) times daily. 10/31/13   Evans Lance, MD  glucosamine-chondroitin 500-400 MG tablet Take 1 tablet by mouth 2 (two) times daily.     Historical Provider, MD  insulin aspart (NOVOLOG) 100 UNIT/ML injection Inject 10 Units into the skin every evening.     Historical Provider, MD  insulin glargine (LANTUS) 100 UNIT/ML injection Inject 52 Units into the skin daily.     Historical Provider, MD  metoprolol (LOPRESSOR) 100 MG tablet Take 100 mg by mouth 2 (two) times daily.     Historical Provider, MD  Misc Natural Products (APPLE CIDER VINEGAR) TABS Take 1 tablet by mouth 2 (two) times daily.     Historical Provider, MD  nitroGLYCERIN (NITROSTAT) 0.4 MG SL tablet Place 0.4 mg under the tongue every 5 (five) minutes as needed. CHEST PAIN     Historical Provider, MD  NON FORMULARY O2 AT NIGHT    Historical Provider, MD  omeprazole (PRILOSEC) 20 MG capsule Take 20 mg by mouth 2 (two) times daily.     Historical Provider, MD  ondansetron (ZOFRAN ODT) 8 MG disintegrating tablet Take 1 tablet (8 mg total) by mouth every 8 (eight) hours as needed for nausea or vomiting. 04/20/14    Jola Schmidt, MD  oxyCODONE-acetaminophen (PERCOCET/ROXICET) 5-325 MG per tablet Take 1 tablet by mouth every 4 (four) hours as needed for severe pain. 04/20/14   Jola Schmidt, MD  potassium chloride SA (KLOR-CON M20) 20 MEQ tablet Take 1 tablet (20 mEq total) by mouth daily. 10/10/13   Evans Lance, MD  sitaGLIPtan-metformin (JANUMET) 50-500 MG per tablet Take 1 tablet by mouth 2 (two) times daily with a meal.     Historical Provider, MD  vitamin B-12 (CYANOCOBALAMIN) 1000 MCG tablet Take 1,000 mcg by mouth every evening.    Historical Provider, MD  vitamin C (ASCORBIC ACID) 500 MG tablet Take 500 mg by mouth every evening.     Historical Provider, MD  warfarin (COUMADIN) 5 MG tablet Take 5 mg by mouth daily at 6 PM.  09/27/13   Historical Provider, MD   BP 116/52 mmHg  Pulse 79  Temp(Src) 92 F (33.3 C) (Oral)  Resp 12  Ht 6\' 4"  (1.93 m)  Wt 240 lb (108.863 kg)  BMI 29.23 kg/m2  SpO2 91% Physical Exam  Constitutional: He is oriented to person, place, and time. He appears well-developed and well-nourished. No distress.  HENT:  Head: Normocephalic and atraumatic.  Mouth/Throat: Oropharynx is clear and moist. No oropharyngeal exudate.  1cm superficial abrasion to nasal bridge.   Eyes: Conjunctivae and EOM are normal. Pupils are equal, round, and reactive to light.  Neck: Normal range of motion.  Cardiovascular: Normal rate and regular rhythm.  Exam reveals no gallop and no friction rub.   No murmur heard. Pulmonary/Chest: Effort normal and breath sounds normal. He has no wheezes. He has no rales. He exhibits no tenderness.  Abdominal: Soft. He exhibits no distension. There is no tenderness. There is no rebound.  Musculoskeletal: Normal range of motion.  Midline cervical spine tenderness to palpation. No other midline spine tenderness.  Neurological: He is alert and oriented to person, place, and time. No cranial nerve deficit. Coordination normal.  Extremity strength and sensation  equal and intact bilaterally. Speech is goal-oriented. Moves limbs without ataxia.   Skin: Skin is warm and dry.  See HENT.   Psychiatric: He has a normal mood and affect. His behavior is normal.  Nursing note and vitals reviewed.   ED Course  Procedures (including critical care time) Labs Review Labs Reviewed  CBG MONITORING, ED - Abnormal; Notable for the following:    Glucose-Capillary 125 (*)    All other components within normal limits  CBC WITH DIFFERENTIAL/PLATELET  BASIC METABOLIC PANEL    Imaging Review Ct Head Wo Contrast  08/23/2014   CLINICAL DATA:  [place] in shower.  EXAM: CT HEAD WITHOUT CONTRAST  CT CERVICAL SPINE WITHOUT CONTRAST  TECHNIQUE: Multidetector CT imaging of the head and cervical spine was performed following the standard protocol without intravenous contrast. Multiplanar CT image reconstructions of the cervical spine were also generated.  COMPARISON:  05/26/2013  FINDINGS: CT HEAD FINDINGS  Stable atrophy and small vessel disease. The brain demonstrates no evidence of hemorrhage, infarction, edema, mass effect, extra-axial fluid collection, hydrocephalus or mass lesion. The skull is unremarkable. No evidence of skull fracture  CT CERVICAL SPINE FINDINGS  The cervical spine shows normal alignment. There is no evidence of acute fracture or subluxation. No soft tissue swelling or hematoma is identified. Stable spondylosis of the cervical spine with multilevel disc disease, most prominently at C5-6 and C6-7. Stable anterolisthesis of C4 on C5 of approximately 3 mm. No bony or soft tissue lesions are seen. The visualized airway is normally patent.  IMPRESSION: No acute findings by head CT or cervical CT. Stable atrophy and small vessel disease of the brain. Stable multilevel spondylosis of the cervical spine.   Electronically Signed   By: Aletta Edouard M.D.   On: 08/23/2014 09:55   Ct Cervical Spine Wo Contrast  08/23/2014   CLINICAL DATA:  [place] in shower.  EXAM: CT HEAD  WITHOUT CONTRAST  CT CERVICAL SPINE WITHOUT CONTRAST  TECHNIQUE: Multidetector CT imaging of the head and cervical spine was performed following the standard protocol without intravenous contrast. Multiplanar CT image reconstructions of the cervical spine were also generated.  COMPARISON:  05/26/2013  FINDINGS: CT HEAD FINDINGS  Stable atrophy and small vessel disease. The brain demonstrates no evidence of hemorrhage, infarction, edema, mass effect, extra-axial fluid collection, hydrocephalus or mass lesion. The skull is unremarkable. No evidence of skull fracture  CT CERVICAL SPINE FINDINGS  The cervical spine shows normal alignment. There is no evidence of acute fracture or subluxation. No soft tissue swelling or hematoma is identified. Stable spondylosis of the cervical spine with multilevel disc disease, most prominently at C5-6 and C6-7. Stable anterolisthesis of C4 on C5 of approximately 3 mm. No bony or soft tissue lesions are seen. The visualized airway is normally patent.  IMPRESSION: No acute findings by head CT or cervical CT. Stable atrophy and small vessel disease of the brain. Stable multilevel spondylosis of the cervical spine.   Electronically Signed   By: Aletta Edouard M.D.   On: 08/23/2014 09:55     EKG Interpretation   Date/Time:  Thursday August 23 2014 08:12:07 EST Ventricular Rate:  76 PR Interval:    QRS Duration: 214 QT Interval:  521 QTC Calculation: 586 R Axis:   -71 Text Interpretation:  Accelerated junctional rhythm LVH with IVCD, LAD and  secondary repol abnrm  Prolonged QT interval ST elevation inferior leads  New since previous tracing Confirmed by Kathrynn Humble, MD, Thelma Comp [phone]) on  08/23/2014 8:27:58 AM      MDM   Final diagnoses:  Hypoglycemia due to insulin  Hypokalemia  Hypothermia, initial encounter    8:54 AM Labs pending. Patient's rectal temp 38F.  10:30 AM Patient was started on the bear hugger and rectal temp is now 93.12F. Sepsis work up initiated.  Urinalysis, chest xray, lactic acid and blood cultures pending. CT head and cervical spine unremarkable for acute changes.   1:21 PM Lactic acid elevated. No source of infection identified. Patient will be admitted to [facility].   Alvina Chou, PA-C 08/23/14 [place], MD 08/25/14 1655

## 2014-08-23 NOTE — ED Notes (Signed)
Admitting dr at bedside.  

## 2014-08-23 NOTE — ED Notes (Signed)
CBG 34 

## 2014-08-23 NOTE — ED Notes (Signed)
Breakfast tray given. °

## 2014-08-23 NOTE — ED Notes (Signed)
Hospitalist at bedside 

## 2014-08-23 NOTE — ED Notes (Signed)
[facility]- pt coming from home, took 52 units of insulin at home without eating breakfast, pt passed out in the shower. CBG noted to be 17 on EMS arrival. Given one amp of d50. CBG up to 124. A&O X4. On spine board on arrival just for transport purposes. No c/o pain.

## 2014-08-23 NOTE — Care Management Note (Signed)
    Page 1 of 1   08/23/2014     4:00:34 PM CARE MANAGEMENT NOTE 08/23/2014  Patient:  HARRIE, CAZAREZ   Account Number:  [id number]  Date Initiated:  08/23/2014  Documentation initiated by:  Elissa Hefty  Subjective/Objective Assessment:   adm w hypokalemia     Action/Plan:   lives w wife, pcp dr w pharr   Anticipated DC Date:     Anticipated DC Plan:  [place]         Choice offered to / List presented to:             Status of service:   Medicare Important Message given?   (If response is "NO", the following Medicare IM given date fields will be blank) Date Medicare IM given:   Medicare IM given by:   Date Additional Medicare IM given:   Additional Medicare IM given by:    Discharge Disposition:    Per UR Regulation:  Reviewed for med. necessity/level of care/duration of stay  If discussed at [place] of Stay Meetings, dates discussed:    Comments:

## 2014-08-23 NOTE — ED Notes (Signed)
CBG: 208

## 2014-08-23 NOTE — ED Notes (Signed)
Warming blanket placed 

## 2014-08-23 NOTE — Progress Notes (Signed)
ANTICOAGULATION CONSULT NOTE - Initial Consult  Pharmacy Consult for Coumadin Indication: atrial fibrillation  Allergies  Allergen Reactions  . Peanut-Containing Drug Products Anaphylaxis and Other (See Comments)    ONLY WALNUTS  . Nisoldipine Hives and Nausea And Vomiting  . Procardia [Nifedipine] Other (See Comments)    Headaches  . Sular [Nisoldipine Er] Hives and Nausea And Vomiting  . Viagra [Sildenafil Citrate] Other (See Comments)    Headaches  . Isosorbide Mononitrate Rash  . Sulfa Antibiotics Rash  . Vioxx [Rofecoxib] Palpitations  . Zithromax [Azithromycin] Rash    Patient Measurements: Height: 6\' 4"  (193 cm) Weight: 240 lb (108.863 kg) IBW/kg (Calculated) : 86.8 Heparin Dosing Weight: v   Vital Signs: Temp: 97.2 F (36.2 C) (03/10 1549) Temp Source: Oral (03/10 1549) BP: 168/71 mmHg (03/10 1549) Pulse Rate: 79 (03/10 1500)  Labs:  Recent Labs  08/23/14 0820  HGB 11.3*  HCT 33.1*  PLT 144*  CREATININE 1.00    Estimated Creatinine Clearance: 77 mL/min (by C-G formula based on Cr of 1).   Medical History: Past Medical History  Diagnosis Date  . Angina   . Coronary artery disease   . Heart murmur   . Hypertension     hyperlipidemia  . Shortness of breath     with exertion  . Recurrent upper respiratory infection (URI)     statrted with cold symptoms 05/30/11- large amount clear drainage out of nose, with cough- non productive- states chest hurts from coughing so much.  ?fever last PM  . Sleep apnea     last study years ago- SEVERS PER PATIENT- does not wear mask  . Chronic kidney disease   . Renal lithiasis     states in both kidneys, hematuria  . H/O hiatal hernia   . Headache(784.0)     history of migraines  . Stroke     3 mini strokes-last one 1 1/2 yrs ago  . Cancer     skin cancer left ear  . Arthritis   . 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  Dysrhythmia     atrial fibrilliation,s/p PPM,bradycardia s/p PPM, hx tachy-brady syndrome. LAST OV WITH INTERROGATION ON CHART. Clearance Dr Lovena Le with ok to stop coumadin 5 days pre op on chart  . Myocardial infarction     states silent heart attack years ago, but no damage-- stress test 2009 on chart  . Hematuria   . Syncope and collapse 05/26/13    ER visit 05/26/13 due to syncope. Possibly due to doxazosin.  . Diabetes mellitus with neurological manifestations, uncontrolled   . Edema   . Impotence of organic origin     ED  . Peptic ulcer, unspecified site, unspecified as acute or chronic, without mention of hemorrhage or perforation, with obstruction   . Pure hypercholesterolemia   . Chronic ischemic heart disease, unspecified   . Anemia, unspecified   . Asthmatic bronchitis   . AAA (abdominal aortic aneurysm)   . Carotid stenosis   . BPH (benign prostatic hyperplasia)   . Hemorrhoids   . LBBB (left bundle branch block)   . Eczema   . Actinic keratoses   . Nephrolithiasis   . Vitamin B12 deficiency   . Allergic rhinitis   . History of MI (myocardial infarction)   . Osteoarthritis   . Esophageal reflux   . CHF (congestive heart failure)   . History of TIA (  transient ischemic attack)   . Polymyalgia rheumatica   . Low back pain   . Hard of hearing   . Anaphylactic reaction     Tree nut (WALNUT ONLY)  . Coronary artery ectasia   . Carotid bruit   . Stable angina   . Dyspnea   . At high risk for falls   . Syncope     Medications:  Prescriptions prior to admission  Medication Sig Dispense Refill Last Dose  . ACCU-CHEK AVIVA PLUS test strip    unknown  . atorvastatin (LIPITOR) 40 MG tablet Take 20 mg by mouth daily.    08/22/2014 at Unknown time  . cholecalciferol (VITAMIN D) 400 UNITS TABS Take 400 Units by mouth every evening.    08/22/2014 at Unknown time  . Cinnamon 500 MG capsule Take 500 mg by mouth 2 (two) times daily.    08/22/2014 at Unknown time  . digoxin  (LANOXIN) 0.125 MG tablet Take 1 tablet (0.125 mg total) by mouth daily. 90 tablet 3 08/22/2014 at Unknown time  . diltiazem (CARDIZEM CD) 180 MG 24 hr capsule Take 1 capsule (180 mg total) by mouth at bedtime. 90 capsule 3 08/22/2014 at Unknown time  . doxazosin (CARDURA) 4 MG tablet Take 2 mg by mouth at bedtime.    08/22/2014 at Unknown time  . finasteride (PROSCAR) 5 MG tablet Take 5 mg by mouth daily.    08/22/2014 at Unknown time  . folic acid (FOLVITE) 440 MCG tablet Take 400 mcg by mouth every evening.    08/22/2014 at Unknown time  . furosemide (LASIX) 40 MG tablet Take 1 tablet (40 mg total) by mouth 2 (two) times daily. 180 tablet 3 08/22/2014 at Unknown time  . glucosamine-chondroitin 500-400 MG tablet Take 1 tablet by mouth 2 (two) times daily.    08/22/2014 at Unknown time  . insulin aspart (NOVOLOG) 100 UNIT/ML injection Inject 10 Units into the skin every evening.    08/22/2014 at Unknown time  . insulin glargine (LANTUS) 100 UNIT/ML injection Inject 52 Units into the skin daily.    08/23/2014 at Unknown time  . metoprolol (LOPRESSOR) 100 MG tablet Take 100 mg by mouth 2 (two) times daily.    08/22/2014 at 1800  . Misc Natural Products (APPLE CIDER VINEGAR) TABS Take 1 tablet by mouth 2 (two) times daily.    08/22/2014 at Unknown time  . nitroGLYCERIN (NITROSTAT) 0.4 MG SL tablet Place 0.4 mg under the tongue every 5 (five) minutes as needed. CHEST PAIN    not yet taken  . NON FORMULARY O2 AT NIGHT   unknown  . omeprazole (PRILOSEC) 20 MG capsule Take 20 mg by mouth 2 (two) times daily.    08/22/2014 at Unknown time  . oxyCODONE-acetaminophen (PERCOCET/ROXICET) 5-325 MG per tablet Take 1 tablet by mouth every 4 (four) hours as needed for severe pain. 20 tablet 0 over 30 days  . potassium chloride SA (KLOR-CON M20) 20 MEQ tablet Take 1 tablet (20 mEq total) by mouth daily. 90 tablet 3 08/22/2014 at Unknown time  . sitaGLIPtin-metformin (JANUMET) 50-1000 MG per tablet Take 1 tablet by mouth 2 (two) times daily  with a meal.   08/22/2014 at Unknown time  . traMADol-acetaminophen (ULTRACET) 37.5-325 MG per tablet Take 1 tablet by mouth every 6 (six) hours as needed for moderate pain or severe pain.   over 30 days  . vitamin B-12 (CYANOCOBALAMIN) 1000 MCG tablet Take 1,000 mcg by mouth 2 (two) times daily.  08/22/2014 at Unknown time  . warfarin (COUMADIN) 5 MG tablet Take 5 mg by mouth daily at 6 PM.    08/22/2014 at Unknown time  . ondansetron (ZOFRAN ODT) 8 MG disintegrating tablet Take 1 tablet (8 mg total) by mouth every 8 (eight) hours as needed for nausea or vomiting. (Patient not taking: Reported on 08/23/2014) 10 tablet 0 Not Taking at Unknown time    Assessment: Seizures 81 y/o M previously feeling normal was found 3/10 by wife having convulsions (no h/o seizures). CBG was 17 (took normal 52 units of Lantus). Bit his tongue. Temp in ED hypothermic 93.9. K+ 2.4. Digoxin level 0.3.  Anticoagulation: Chronic Coumadin for h/o afib/CVA. INR=2.   Goal of Therapy:  INR 2-3 Monitor platelets by anticoagulation protocol: Yes   Plan:  Continue Coumadin 5mg  q1800 Daily INR F/u baseline CBC in AM.  Ruthella Kirchman S. Alford Highland, PharmD, BCPS Clinical Staff Pharmacist Pager [phone]  Eilene Ghazi Stillinger 08/23/2014,4:31 PM

## 2014-08-23 NOTE — Progress Notes (Signed)
Discussed EKG with Dr. Dorris Carnes of Cardiology who reviewed the records, EKG and telemetry.  She feels that given the CBG this was unlikely to be a primary cardiac event, but in the EKG it did not appear as though the pace maker was capturing.  Dr. Harrington Challenger offered to have the [facility] Rep interrogate the patient's device in the morning (3/11).  Imogene Burn, PA-C [facility] Pager: [phone]

## 2014-08-23 NOTE — ED Notes (Signed)
NOTIFIED DR. NATAVATI FOR PATIENTS LAB RESULTS OF CG4+LACTIC ACID @12 :03PM ,08/23/2014.

## 2014-08-23 NOTE — ED Notes (Signed)
Admitting MD at bedside. Oral temp taken T 97.8. Orders to take blanket warmer off.

## 2014-08-23 NOTE — H&P (Signed)
[facility] History and Physical                                                                                    Paul Dalton, is a 81 y.o. male  MRN: [id number]   DOB - 1930-04-06  Admit Date - 08/23/2014  Outpatient Primary MD for the patient is Horatio Pel, MD  With History of -  Past Medical History  Diagnosis Date  . Angina   . Coronary artery disease   . Heart murmur   . Hypertension     hyperlipidemia  . Shortness of breath     with exertion  . Recurrent upper respiratory infection (URI)     statrted with cold symptoms 05/30/11- large amount clear drainage out of nose, with cough- non productive- states chest hurts from coughing so much.  ?fever last PM  . Sleep apnea     last study years ago- SEVERS PER PATIENT- does not wear mask  . Chronic kidney disease   . Renal lithiasis     states in both kidneys, hematuria  . H/O hiatal hernia   . Headache(784.0)     history of migraines  . Stroke     3 mini strokes-last one 1 1/2 yrs ago  . Cancer     skin cancer left ear  . Arthritis   . 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 Dysrhythmia     atrial fibrilliation,s/p PPM,bradycardia s/p PPM, hx tachy-brady syndrome. LAST OV WITH INTERROGATION ON CHART. Clearance Dr Lovena Le with ok to stop coumadin 5 days pre op on chart  . Myocardial infarction     states silent heart attack years ago, but no damage-- stress test 2009 on chart  . Hematuria   . Syncope and collapse 05/26/13    ER visit 05/26/13 due to syncope. Possibly due to doxazosin.  . Diabetes mellitus with neurological manifestations, uncontrolled   . Edema   . Impotence of organic origin     ED  . Peptic ulcer, unspecified site, unspecified as acute or chronic, without mention of hemorrhage or perforation, with obstruction   . Pure hypercholesterolemia   . Chronic ischemic heart disease, unspecified   . Anemia,  unspecified   . Asthmatic bronchitis   . AAA (abdominal aortic aneurysm)   . Carotid stenosis   . BPH (benign prostatic hyperplasia)   . Hemorrhoids   . LBBB (left bundle branch block)   . Eczema   . Actinic keratoses   . Nephrolithiasis   . Vitamin B12 deficiency   . Allergic rhinitis   . History of MI (myocardial infarction)   . Osteoarthritis   . Esophageal reflux   . CHF (congestive heart failure)   . History of TIA (transient ischemic attack)   . Polymyalgia rheumatica   . Low back pain   . Hard of hearing   . Anaphylactic reaction     Tree nut (WALNUT ONLY)  . Coronary artery ectasia   . Carotid bruit   . Stable angina   . Dyspnea   . At high risk for  falls   . Syncope       Past Surgical History  Procedure Laterality Date  . Insert / replace / remove pacemaker      St Jude- last interrogation 7/12- followed by Dr Lovena Le  . Cardiac catheterization      x 2 -years ago  . Hemorrhoid surgery    . Eye surgery      bilateral cataract extraction with IOL  . Appendectomy    . Hernia repair      bilateral inguinal  . Rotator cuff repair      right  . Doppler echocardiography  2009  . Ureteroscopy  06/24/2011    Procedure: URETEROSCOPY;  Surgeon: Molli Hazard, MD;  Location: [facility];  Service: Urology;  Laterality: Left;  . Cystoscopy w/ retrogrades  06/24/2011    Procedure: CYSTOSCOPY WITH RETROGRADE PYELOGRAM;  Surgeon: Molli Hazard, MD;  Location: [facility];  Service: Urology;  Laterality: Left;  . Cardioversion  03/1998  . Left and right heart catheterization with coronary angiogram N/A 09/22/2013    Procedure: LEFT AND RIGHT HEART CATHETERIZATION WITH CORONARY ANGIOGRAM ;  Surgeon: Peter M Martinique, MD;  Location: [facility] CATH LAB;  Service: Cardiovascular;  Laterality: N/A;    in for   Chief Complaint  Patient presents with  . Hypoglycemia     HPI  Humza Tallerico  is a 81 y.o. 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 Mr. Dubie was brought to the ER this morning by EMS after being found seizing by his wife. Mr. Hemann reports that he has been feeling well lately, he went to bed feeling normally last night got up this morning and followed his usual routine of taking his 52 units of Lantus and then shaving and showering. In the shower he began to feel weak and attempted to call to his wife. His wife found him on the floor of the shower convulsing. She held his head and called EMS. Mr. Chesnut woke up on his living room floor while EMS was examining him. His CBG was 17. There is no report of bowel or bladder incontinence. He does appear to have a bite on his tongue. Mr. Sides denies taking any new medications, he is not on steroids, he has no previous history of seizures or extreme hypoglycemia. His CBG is normally around 110 - 120.  He has been a diabetic for 15 years. His insulin dosage has not changed recently.  His weight has remained constant at approximately 240. He does not drink alcohol.  In the ER he was found to be hypothermic with a rectal temperature of 93.9. He was given D50 which restored his CBG to normal levels. His potassium was 2.4. Initial lactic acid was 2.57. Chest x-ray shows mild congestive heart failure.  Review of Systems   In addition to the HPI above,  No Fever-chills, No Headache, No changes with Vision or hearing, No problems swallowing food or Liquids, No Chest pain, Cough or Shortness of Breath, No Abdominal pain, No Nausea or Vomiting, Bowel movements are regular, No Blood in stool or Urine, No dysuria, No new skin rashes or bruises, No new joints pains-aches,  Mr. Gari Crown reports new paresthesias in his hands and fingers, he states that he has had decreased sensation in the soles of his feet for quite some time. No recent weight gain or loss, A full 10 point Review of Systems was done,  except as stated  above, all other Review of Systems were negative.  Social History History  Substance Use Topics  . Smoking status: Former Smoker -- 30 years    Types: Cigarettes, Pipe, Cigars    Quit date: 06/01/1987  . Smokeless tobacco: Never Used  . Alcohol Use: No    Family History Family History  Problem Relation Age of Onset  . Heart disease Mother   . CVA Father     Prior to Admission medications   Medication Sig Start Date End Date Taking? Authorizing Provider  ACCU-CHEK AVIVA PLUS test strip  08/02/13  Yes Historical Provider, MD  atorvastatin (LIPITOR) 40 MG tablet Take 20 mg by mouth daily.    Yes Historical Provider, MD  cholecalciferol (VITAMIN D) 400 UNITS TABS Take 400 Units by mouth every evening.    Yes Historical Provider, MD  Cinnamon 500 MG capsule Take 500 mg by mouth 2 (two) times daily.    Yes Historical Provider, MD  digoxin (LANOXIN) 0.125 MG tablet Take 1 tablet (0.125 mg total) by mouth daily. 10/31/13  Yes Evans Lance, MD  diltiazem (CARDIZEM CD) 180 MG 24 hr capsule Take 1 capsule (180 mg total) by mouth at bedtime. 10/31/13  Yes Evans Lance, MD  doxazosin (CARDURA) 4 MG tablet Take 2 mg by mouth at bedtime.    Yes Historical Provider, MD  finasteride (PROSCAR) 5 MG tablet Take 5 mg by mouth daily.    Yes Historical Provider, MD  folic acid (FOLVITE) 528 MCG tablet Take 400 mcg by mouth every evening.    Yes Historical Provider, MD  furosemide (LASIX) 40 MG tablet Take 1 tablet (40 mg total) by mouth 2 (two) times daily. 10/31/13  Yes Evans Lance, MD  glucosamine-chondroitin 500-400 MG tablet Take 1 tablet by mouth 2 (two) times daily.    Yes Historical Provider, MD  insulin aspart (NOVOLOG) 100 UNIT/ML injection Inject 10 Units into the skin every evening.    Yes Historical Provider, MD  insulin glargine (LANTUS) 100 UNIT/ML injection Inject 52 Units into the skin daily.    Yes Historical Provider, MD  metoprolol (LOPRESSOR) 100 MG tablet Take 100 mg by mouth 2  (two) times daily.    Yes Historical Provider, MD  Misc Natural Products (APPLE CIDER VINEGAR) TABS Take 1 tablet by mouth 2 (two) times daily.    Yes Historical Provider, MD  nitroGLYCERIN (NITROSTAT) 0.4 MG SL tablet Place 0.4 mg under the tongue every 5 (five) minutes as needed. CHEST PAIN    Yes Historical Provider, MD  NON FORMULARY O2 AT NIGHT   Yes Historical Provider, MD  omeprazole (PRILOSEC) 20 MG capsule Take 20 mg by mouth 2 (two) times daily.    Yes Historical Provider, MD  oxyCODONE-acetaminophen (PERCOCET/ROXICET) 5-325 MG per tablet Take 1 tablet by mouth every 4 (four) hours as needed for severe pain. 04/20/14  Yes Jola Schmidt, MD  potassium chloride SA (KLOR-CON M20) 20 MEQ tablet Take 1 tablet (20 mEq total) by mouth daily. 10/10/13  Yes Evans Lance, MD  sitaGLIPtin-metformin (JANUMET) 50-1000 MG per tablet Take 1 tablet by mouth 2 (two) times daily with a meal.   Yes Historical Provider, MD  traMADol-acetaminophen (ULTRACET) 37.5-325 MG per tablet Take 1 tablet by mouth every 6 (six) hours as needed for moderate pain or severe pain.   Yes Historical Provider, MD  vitamin B-12 (CYANOCOBALAMIN) 1000 MCG tablet Take 1,000 mcg by mouth 2 (two) times daily.  Yes Historical Provider, MD  warfarin (COUMADIN) 5 MG tablet Take 5 mg by mouth daily at 6 PM.  09/27/13  Yes Historical Provider, MD  ondansetron (ZOFRAN ODT) 8 MG disintegrating tablet Take 1 tablet (8 mg total) by mouth every 8 (eight) hours as needed for nausea or vomiting. Patient not taking: Reported on 08/23/2014 04/20/14   Jola Schmidt, MD    Allergies  Allergen Reactions  . Peanut-Containing Drug Products Anaphylaxis and Other (See Comments)    ONLY WALNUTS  . Nisoldipine Hives and Nausea And Vomiting  . Procardia [Nifedipine] Other (See Comments)    Headaches  . Sular [Nisoldipine Er] Hives and Nausea And Vomiting  . Viagra [Sildenafil Citrate] Other (See Comments)    Headaches  . Isosorbide Mononitrate Rash   . Sulfa Antibiotics Rash  . Vioxx [Rofecoxib] Palpitations  . Zithromax [Azithromycin] Rash    Physical Exam  Vitals  Blood pressure 168/71, pulse 79, temperature 97.2 F (36.2 C), temperature source Oral, resp. rate 13, height 6\' 4"  (1.93 m), weight 108.863 kg (240 lb), SpO2 96 %.   General:  Well-developed, elderly male, very pleasant lying in bed in NAD, daughters at bedside.  Psych:  Normal affect and insight, Not Suicidal or Homicidal, Awake Alert, Oriented X 3.  Neuro:   No F.N deficits other than being hard of hearing., ALL C.Nerves Intact, Strength 5/5 all 4 extremities, Sensation intact all 4 extremities.  ENT:  Ears and Eyes appear Normal, Conjunctivae clear, PER. Moist oral mucosa without erythema or exudates.  Neck:  Supple, No lymphadenopathy appreciated  Respiratory:  Symmetrical chest wall movement, Good air movement bilaterally, CTAB.  Cardiac:  RRR, 3/6 systolic murmur heard best at the left sternal border, mild 1+ edema noted in all 4 extremities. no frank JVD.    Abdomen:  Positive bowel sounds, Soft, Non tender, Non distended,  No masses appreciated  Skin:  No Cyanosis, Normal Skin Turgor, No Skin Rash or Bruise.  Extremities:  Able to move all 4. 5/5 strength in each,  no effusions.  Data Review  CBC  Recent Labs Lab 08/23/14 0820  WBC 8.8  HGB 11.3*  HCT 33.1*  PLT 144*  MCV 95.9  MCH 32.8  MCHC 34.1  RDW 14.5  LYMPHSABS 1.5  MONOABS 0.9  EOSABS 0.1  BASOSABS 0.0    Chemistries   Recent Labs Lab 08/23/14 0820  NA 141  K 2.4*  CL 104  CO2 26  GLUCOSE 171*  BUN 20  CREATININE 1.00  CALCIUM 9.0     Urinalysis    Component Value Date/Time   COLORURINE YELLOW 08/23/2014 1028   APPEARANCEUR CLEAR 08/23/2014 1028   LABSPEC 1.013 08/23/2014 1028   PHURINE 7.0 08/23/2014 1028   GLUCOSEU 100* 08/23/2014 1028   HGBUR SMALL* 08/23/2014 1028   BILIRUBINUR NEGATIVE 08/23/2014 1028   KETONESUR NEGATIVE 08/23/2014 1028    PROTEINUR NEGATIVE 08/23/2014 1028   UROBILINOGEN 0.2 08/23/2014 1028   NITRITE NEGATIVE 08/23/2014 1028   LEUKOCYTESUR NEGATIVE 08/23/2014 1028    Imaging results:   Ct Head Wo Contrast  08/23/2014   CLINICAL DATA:  Fell in shower.  EXAM: CT HEAD WITHOUT CONTRAST  CT CERVICAL SPINE WITHOUT CONTRAST  TECHNIQUE: Multidetector CT imaging of the head and cervical spine was performed following the standard protocol without intravenous contrast. Multiplanar CT image reconstructions of the cervical spine were also generated.  COMPARISON:  05/26/2013  FINDINGS: CT HEAD FINDINGS  Stable atrophy and small vessel disease. The brain demonstrates  no evidence of hemorrhage, infarction, edema, mass effect, extra-axial fluid collection, hydrocephalus or mass lesion. The skull is unremarkable. No evidence of skull fracture  CT CERVICAL SPINE FINDINGS  The cervical spine shows normal alignment. There is no evidence of acute fracture or subluxation. No soft tissue swelling or hematoma is identified. Stable spondylosis of the cervical spine with multilevel disc disease, most prominently at C5-6 and C6-7. Stable anterolisthesis of C4 on C5 of approximately 3 mm. No bony or soft tissue lesions are seen. The visualized airway is normally patent.  IMPRESSION: No acute findings by head CT or cervical CT. Stable atrophy and small vessel disease of the brain. Stable multilevel spondylosis of the cervical spine.   Electronically Signed   By: Aletta Edouard M.D.   On: 08/23/2014 09:55   Ct Cervical Spine Wo Contrast  08/23/2014   CLINICAL DATA:  [place] in shower.  EXAM: CT HEAD WITHOUT CONTRAST  CT CERVICAL SPINE WITHOUT CONTRAST  TECHNIQUE: Multidetector CT imaging of the head and cervical spine was performed following the standard protocol without intravenous contrast. Multiplanar CT image reconstructions of the cervical spine were also generated.  COMPARISON:  05/26/2013  FINDINGS: CT HEAD FINDINGS  Stable atrophy and small  vessel disease. The brain demonstrates no evidence of hemorrhage, infarction, edema, mass effect, extra-axial fluid collection, hydrocephalus or mass lesion. The skull is unremarkable. No evidence of skull fracture  CT CERVICAL SPINE FINDINGS  The cervical spine shows normal alignment. There is no evidence of acute fracture or subluxation. No soft tissue swelling or hematoma is identified. Stable spondylosis of the cervical spine with multilevel disc disease, most prominently at C5-6 and C6-7. Stable anterolisthesis of C4 on C5 of approximately 3 mm. No bony or soft tissue lesions are seen. The visualized airway is normally patent.  IMPRESSION: No acute findings by head CT or cervical CT. Stable atrophy and small vessel disease of the brain. Stable multilevel spondylosis of the cervical spine.   Electronically Signed   By: Aletta Edouard M.D.   On: 08/23/2014 09:55   Dg Chest Portable 1 View  08/23/2014   CLINICAL DATA:  Hypoglycemia. Hypertension. Diabetes. Asthma. CHF.  EXAM: PORTABLE CHEST - 1 VIEW  COMPARISON:  None.  FINDINGS: Mediastinum and hilar structures normal. Cardiac pacer in stable position . Cardiomegaly with mild scratch pulmonary vascular and interstitial prominence consistent with congestive heart failure. No pleural effusion or pneumothorax. No acute bony abnormality.  IMPRESSION: 1. Mild congestive heart failure and pulmonary interstitial edema. 2. Cardiac pacer noted with lead tips in the right atrium and right ventricle.   Electronically Signed   By: Marcello Moores  Register   On: 08/23/2014 11:17    My personal review of EKG: Prolonged QT of 521.  Junctional rhythm - Does not appear paced.     Assessment & Plan  Principal Problem:   Hypoglycemia associated with type 2 diabetes mellitus Active Problems:   Chronic diastolic heart failure   Cardiac pacemaker in situ   Hypokalemia   Hypothermia   Seizure   Lactic acidosis   Elevated troponin I level  Hypoglycemia (CBG 17) with  resultant seizure Uncertain etiology. Patient states he took is medication "per routine" no changes.  No new meds, no illness, no weight loss. Patient is on Janumet, lantus 52 units in the morning, and novolog 10 units at night (?) at home. Given D50 to restore CBG.  Will place on SSI-S and give Lantus dose of 45 units in the am.  Hold  Janumet. LFTs, TSH, AM cortisol, are pending.   Diabetic coordinator consultation requested.  Seizure Felt to be due to hypoglycemia.  Discussed via telephone with on call neurology who felt further work up or medication was unnecessary. Check Dig level. Presumed to be a provoked seizure.   Lactic Acidosis Likely the result of prolonged seizure. Trending down in the ER from 2.57 >> 1.88.  Doubt infection.  Hypothermia Uncertain etiology but likely due to hypoglycemia and seizure. Resolved in the ER.  Initial oral temp 92.  Restored by Bair Hugger to 97.8.  Elevated troponin. (0.02 >> 0.05) No complains of chest pain.   EKG slightly different but supposed to be paced. Will cycle troponins and monitor on tele.  Diastolic heart failure grade 3. Patient with mild heart failure noted on CXR.  Will continue home dose of lasix as he does not appear terribly fluid overloaded at this time. Continue cardizem, dig, lasix, metoprolol  Hx of Afib, tachy-brady, and pace maker placement Continue rate control medications and coumadin per pharmacy.   DVT Prophylaxis: coumadin per pharmacy  AM Labs Ordered, also please review Full Orders  Family Communication:   Daughters at bedside.  Code Status:  Full code  Condition:  Guarded.  Time spent in minutes : 60  Foye Spurling, PA-S [place], PA-C [facility] 08/23/2014, 4:10 PM After 7pm go to [email or web address] - password [email or web address]  And look for the night coverage person covering me after hours  [facility]

## 2014-08-23 NOTE — ED Notes (Signed)
CBG 125 °

## 2014-08-24 DIAGNOSIS — I5032 Chronic diastolic (congestive) heart failure: Secondary | ICD-10-CM

## 2014-08-24 DIAGNOSIS — E11649 Type 2 diabetes mellitus with hypoglycemia without coma: Principal | ICD-10-CM

## 2014-08-24 LAB — PROTIME-INR
INR: 1.9 — ABNORMAL HIGH (ref 0.00–1.49)
Prothrombin Time: 22 seconds — ABNORMAL HIGH (ref 11.6–15.2)

## 2014-08-24 LAB — BASIC METABOLIC PANEL
Anion gap: 7 (ref 5–15)
BUN: 14 mg/dL (ref 6–23)
CHLORIDE: 105 mmol/L (ref 96–112)
CO2: 29 mmol/L (ref 19–32)
CREATININE: 0.94 mg/dL (ref 0.50–1.35)
Calcium: 9 mg/dL (ref 8.4–10.5)
GFR calc Af Amer: 88 mL/min — ABNORMAL LOW (ref >90)
GFR calc non Af Amer: 76 mL/min — ABNORMAL LOW (ref >90)
GLUCOSE: 157 mg/dL — AB (ref 70–99)
POTASSIUM: 4.3 mmol/L (ref 3.5–5.1)
Sodium: 141 mmol/L (ref 135–145)

## 2014-08-24 LAB — TROPONIN I: TROPONIN I: 0.04 ng/mL — AB (ref <0.031)

## 2014-08-24 LAB — CBC
HCT: 32.5 % — ABNORMAL LOW (ref 39.0–52.0)
Hemoglobin: 10.9 g/dL — ABNORMAL LOW (ref 13.0–17.0)
MCH: 32.5 pg (ref 26.0–34.0)
MCHC: 33.5 g/dL (ref 30.0–36.0)
MCV: 97 fL (ref 78.0–100.0)
PLATELETS: 146 10*3/uL — AB (ref 150–400)
RBC: 3.35 MIL/uL — ABNORMAL LOW (ref 4.22–5.81)
RDW: 14.7 % (ref 11.5–15.5)
WBC: 6.2 10*3/uL (ref 4.0–10.5)

## 2014-08-24 LAB — GLUCOSE, CAPILLARY
GLUCOSE-CAPILLARY: 169 mg/dL — AB (ref 70–99)
GLUCOSE-CAPILLARY: 173 mg/dL — AB (ref 70–99)
GLUCOSE-CAPILLARY: 129 mg/dL — AB (ref 70–99)
GLUCOSE-CAPILLARY: 251 mg/dL — AB (ref 70–99)
GLUCOSE-CAPILLARY: 146 mg/dL — AB (ref 70–99)
Glucose-Capillary: 145 mg/dL — ABNORMAL HIGH (ref 70–99)
Glucose-Capillary: 146 mg/dL — ABNORMAL HIGH (ref 70–99)

## 2014-08-24 LAB — MAGNESIUM: MAGNESIUM: 1.8 mg/dL (ref 1.5–2.5)

## 2014-08-24 LAB — HEMOGLOBIN A1C
Hgb A1c MFr Bld: 6.8 % — ABNORMAL HIGH (ref 4.8–5.6)
Mean Plasma Glucose: 148 mg/dL

## 2014-08-24 LAB — CORTISOL: Cortisol, Plasma: 13.6 ug/dL

## 2014-08-24 MED ORDER — INSULIN ASPART 100 UNIT/ML ~~LOC~~ SOLN
0.0000 [IU] | Freq: Three times a day (TID) | SUBCUTANEOUS | Status: DC
  Administered 2014-08-24: 8 [IU] via SUBCUTANEOUS
  Administered 2014-08-24: 2 [IU] via SUBCUTANEOUS
  Administered 2014-08-25: 3 [IU] via SUBCUTANEOUS

## 2014-08-24 MED ORDER — INSULIN ASPART 100 UNIT/ML ~~LOC~~ SOLN: 0.0000 [IU] | Freq: Every day | SUBCUTANEOUS | Status: DC

## 2014-08-24 MED ORDER — WARFARIN SODIUM 7.5 MG PO TABS
7.5000 mg | ORAL_TABLET | Freq: Once | ORAL | Status: AC
  Administered 2014-08-24: 7.5 mg via ORAL
  Filled 2014-08-24 (×2): qty 1

## 2014-08-24 NOTE — Progress Notes (Signed)
Inpatient Diabetes Program Recommendations  AACE/ADA: New Consensus Statement on Inpatient Glycemic Control (2013)  Target Ranges:  Prepandial:   less than 140 mg/dL      Peak postprandial:   less than 180 mg/dL (1-2 hours)      Critically ill patients:  140 - 180 mg/dL   Consult received: HYPOglycemia in the morning is usually related to the long acting basal insulin.  A slightly reduced dose has been ordered and started today so reassessment tomorrow should give more information to use for Lantus titration.  Recommend changing Novolog to TID since pt is eating.   Will speak with patient concerning events prior to ED and admission.  Thank you  Raoul Pitch BSN, RN,CDE Inpatient Diabetes Coordinator [phone] (team pager)

## 2014-08-24 NOTE — Progress Notes (Signed)
ANTICOAGULATION CONSULT NOTE - Follow Up Consult  Pharmacy Consult for Coumadin Indication: atrial fibrillation and CVA  Allergies  Allergen Reactions  . Peanut-Containing Drug Products Anaphylaxis and Other (See Comments)    ONLY WALNUTS  . Nisoldipine Hives and Nausea And Vomiting  . Procardia [Nifedipine] Other (See Comments)    Headaches  . Sular [Nisoldipine Er] Hives and Nausea And Vomiting  . Viagra [Sildenafil Citrate] Other (See Comments)    Headaches  . Isosorbide Mononitrate Rash  . Sulfa Antibiotics Rash  . Vioxx [Rofecoxib] Palpitations  . Zithromax [Azithromycin] Rash    Patient Measurements: Height: 6\' 4"  (193 cm) Weight: 240 lb (108.863 kg) IBW/kg (Calculated) : 86.8  Vital Signs: Temp: 97.4 F (36.3 C) (03/11 0742) Temp Source: Oral (03/11 0742) BP: 158/51 mmHg (03/11 0742)  Labs:  Recent Labs  08/23/14 0820 08/23/14 1615 08/23/14 1718 08/23/14 2107 08/24/14 0309  HGB 11.3*  --   --   --  10.9*  HCT 33.1*  --   --   --  32.5*  PLT 144*  --   --   --  146*  LABPROT  --   --  22.8*  --  22.0*  INR  --   --  2.00*  --  1.90*  CREATININE 1.00  --   --   --  0.94  CKTOTAL  --   --   --  107  --   TROPONINI  --  0.06*  --  0.05* 0.04*    Estimated Creatinine Clearance: 81.9 mL/min (by C-G formula based on Cr of 0.94).  Assessment: 84yom on coumadin pta for afib and hx CVA, admitted with seizures 2/2 hypoglycemia. INR on admit was therapeutic at 2 on home dose of 5mg  daily. INR has dropped to 1.9 today. CBC stable. No bleeding reported.   Goal of Therapy:  INR 2-3 Monitor platelets by anticoagulation protocol: Yes   Plan:  1) Increase coumadin to 7.5mg  x 1 2) Daily INR  Deboraha Sprang 08/24/2014,10:27 AM

## 2014-08-24 NOTE — Progress Notes (Signed)
Inpatient Diabetes Program Recommendations  AACE/ADA: New Consensus Statement on Inpatient Glycemic Control (2013)  Target Ranges:  Prepandial:   less than 140 mg/dL      Peak postprandial:   less than 180 mg/dL (1-2 hours)      Critically ill patients:  140 - 180 mg/dL   This coordinator met with patient to discuss the events that led up to his hypoglycemic event at home.  Pt reports that he did stretching exercises upon waking and then proceeded to check his blood glucose which was 89.  He then reports he took his regularly scheduled Lantus 52 units and went to take a shower. Pt reports his morning glucose is typically 120-130 range.  Pt states he felt flushed while in the shower and sprayed water on his face and then felt "funny" and thought he should call for his wife but the words didn't come out.  Recommended that patient have some breakfast prior to getting in the shower.  Currently his Lantus dose has been reduced to 45 units.  His fasting glucose tomorrow morning will be a good indicator of whether the reduced dose is appropriate or if it needs more adjustment.  Recommend pt have a GLUCAGON kit at home.  Pt states he did not get his insulins "mixed up" because he keeps them in separate places at home and his Lantus is a vial and syringe and his Novolog is a pen.  (Although med rec states Lantus SoloStar pen he states the [place] changed him to vial bc of cost).  No questions/concerns at the end of our visit. Thank you  Raoul Pitch BSN, RN,CDE Inpatient Diabetes Coordinator [phone] (team pager)

## 2014-08-24 NOTE — Progress Notes (Signed)
PROGRESS NOTE  Paul Dalton OEV:[id number] DOB: 29-Apr-1930 DOA: 08/23/2014 PCP: Horatio Pel, MD  81 year old male with extensive PMH including long-standing DM 2/IDDM, HTN, CAD, CVA, chronic CHF, PPM, was in his usual state of health until 6 AM today when wife noted patient to be sitting on the shower stool in the bathroom having generalized involuntary tonic-clonic activity, unresponsive, eyes rolled up. No reported urinary incontinence. Seemed to have tongue bite on the left side of tongue. EMS was called. CBG 17. As per patient and spouse, has been on current doses of diabetes medications for a long time without any recent hypoglycemic episodes and CBGs at home usually range in the 110s. Gives history of seizure 1 in the past after eating? Almond. Patient was given IV D50 and transported to ED. Patient was initially hypothermic at 66F with elevated lactate. Temperature has normalized after warming blanket. Currently denies complaints. Workup in the ED significant for potassium 2.4, lactate 2.57-decreased to 1.88, hemoglobin 11.3, platelets 144, troponin 2: Negative, chest x-ray without pneumonia, urine microscopy not impressive for UTI and CT head and C-spine negative for acute findings.  Assessment/Plan:  DM with hypoglycemia: Unclear etiology given usual/good by mouth intake and unchanged chronic medications.  - reduced Lantus from 52 to 45 units daily, DC bedtime dose of NovoLog and place on NovoLog SSI. Monitor closely. -recently sick with diarrhea- ? Dehydration and AKI causing lantus to build up  Seizure 1: Prolonged seizure 1 likely secondary to hypoglycemia. CT head negative. PA discussed with neurologist on call who recommended no further workup or AEDs-agree. If he has seizure again in the absence of hypoglycemia, may need further workup and AED consideration.  Hypothermia:? Secondary to hypoglycemia. Resolved. No clinical focus of sepsis. Monitor.  Elevated lactate:  Secondary to seizures. Improving. IV fluids.  Hypokalemia: resolved, Mg ok  Chronic diastolic CHF: Mildly volume overloaded. Minimize IV fluids. Continue home dose of diuretics.  Chronic anemia: Stable  Symptomatic bradycardia/complete heart block/A. fib/status post permanent pacemaker/mild-moderate aortic stenosis: Asymptomatic of chest pain or dyspnea. Minimally elevated troponin likely secondary to prolonged seizure activity. EKG with LBBB morphology. -pacemaker to be interrogated 3/11  Code Status: full Family Communication: patient/wife Disposition Plan: home 1-2 days   Consultants:  Neuro/cards phone consult  Procedures:      HPI/Subjective: Feeling great!  Wants to go home  Objective: Filed Vitals:   08/24/14 0742  BP: 158/51  Pulse:   Temp: 97.4 F (36.3 C)  Resp: 20    Intake/Output Summary (Last 24 hours) at 08/24/14 0827 Last data filed at 08/24/14 0600  Gross per 24 hour  Intake    540 ml  Output   3050 ml  Net  -2510 ml   Filed Weights   08/23/14 0813  Weight: 108.863 kg (240 lb)    Exam:   General:  A+Ox3, NAD  Cardiovascular: rrr  Respiratory: clear  Abdomen: +BS, soft  Musculoskeletal: no edema  Data Reviewed: Basic Metabolic Panel:  Recent Labs Lab 08/23/14 0820 08/23/14 1615 08/24/14 0309  NA 141  --  141  K 2.4*  --  4.3  CL 104  --  105  CO2 26  --  29  GLUCOSE 171*  --  157*  BUN 20  --  14  CREATININE 1.00  --  0.94  CALCIUM 9.0  --  9.0  MG  --  1.8  --    Liver Function Tests:  Recent Labs Lab 08/23/14 1615  AST 26  ALT  25  ALKPHOS 63  BILITOT 0.8  PROT 5.9*  ALBUMIN 3.4*   No results for input(s): LIPASE, AMYLASE in the last 168 hours. No results for input(s): AMMONIA in the last 168 hours. CBC:  Recent Labs Lab 08/23/14 0820 08/24/14 0309  WBC 8.8 6.2  NEUTROABS 6.4  --   HGB 11.3* 10.9*  HCT 33.1* 32.5*  MCV 95.9 97.0  PLT 144* 146*   Cardiac Enzymes:  Recent Labs Lab  08/23/14 1615 08/23/14 2107 08/24/14 0309  CKTOTAL  --  107  --   TROPONINI 0.06* 0.05* 0.04*   BNP (last 3 results) No results for input(s): BNP in the last 8760 hours.  ProBNP (last 3 results) No results for input(s): PROBNP in the last 8760 hours.  CBG:  Recent Labs Lab 08/23/14 1551 08/23/14 2008 08/23/14 2334 08/24/14 0402 08/24/14 0742  GLUCAP 238* 317* 146* 169* 145*    Recent Results (from the past 240 hour(s))  Blood culture (routine x 2)     Status: None (Preliminary result)   Collection Time: 08/23/14 11:19 AM  Result Value Ref Range Status   Specimen Description BLOOD RIGHT HAND  Final   Special Requests BOTTLES DRAWN AEROBIC AND ANAEROBIC 5CC  Final   Culture   Final           BLOOD CULTURE RECEIVED NO GROWTH TO DATE CULTURE WILL BE HELD FOR 5 DAYS BEFORE ISSUING A FINAL NEGATIVE REPORT Performed at [facility]    Report Status PENDING  Incomplete  Blood culture (routine x 2)     Status: None (Preliminary result)   Collection Time: 08/23/14 11:30 AM  Result Value Ref Range Status   Specimen Description BLOOD LEFT HAND  Final   Special Requests BOTTLES DRAWN AEROBIC AND ANAEROBIC 4CC  Final   Culture   Final           BLOOD CULTURE RECEIVED NO GROWTH TO DATE CULTURE WILL BE HELD FOR 5 DAYS BEFORE ISSUING A FINAL NEGATIVE REPORT Performed at [facility]    Report Status PENDING  Incomplete  MRSA PCR Screening     Status: None   Collection Time: 08/23/14  3:52 PM  Result Value Ref Range Status   MRSA by PCR NEGATIVE NEGATIVE Final    Comment:        The GeneXpert MRSA Assay (FDA approved for NASAL specimens only), is one component of a comprehensive MRSA colonization surveillance program. It is not intended to diagnose MRSA infection nor to guide or monitor treatment for MRSA infections.      Studies: Ct Head Wo Contrast  08/23/2014   CLINICAL DATA:  [place] in shower.  EXAM: CT HEAD WITHOUT CONTRAST  CT CERVICAL SPINE WITHOUT  CONTRAST  TECHNIQUE: Multidetector CT imaging of the head and cervical spine was performed following the standard protocol without intravenous contrast. Multiplanar CT image reconstructions of the cervical spine were also generated.  COMPARISON:  05/26/2013  FINDINGS: CT HEAD FINDINGS  Stable atrophy and small vessel disease. The brain demonstrates no evidence of hemorrhage, infarction, edema, mass effect, extra-axial fluid collection, hydrocephalus or mass lesion. The skull is unremarkable. No evidence of skull fracture  CT CERVICAL SPINE FINDINGS  The cervical spine shows normal alignment. There is no evidence of acute fracture or subluxation. No soft tissue swelling or hematoma is identified. Stable spondylosis of the cervical spine with multilevel disc disease, most prominently at C5-6 and C6-7. Stable anterolisthesis of C4 on C5 of approximately 3 mm.  No bony or soft tissue lesions are seen. The visualized airway is normally patent.  IMPRESSION: No acute findings by head CT or cervical CT. Stable atrophy and small vessel disease of the brain. Stable multilevel spondylosis of the cervical spine.   Electronically Signed   By: Aletta Edouard M.D.   On: 08/23/2014 09:55   Ct Cervical Spine Wo Contrast  08/23/2014   CLINICAL DATA:  [place] in shower.  EXAM: CT HEAD WITHOUT CONTRAST  CT CERVICAL SPINE WITHOUT CONTRAST  TECHNIQUE: Multidetector CT imaging of the head and cervical spine was performed following the standard protocol without intravenous contrast. Multiplanar CT image reconstructions of the cervical spine were also generated.  COMPARISON:  05/26/2013  FINDINGS: CT HEAD FINDINGS  Stable atrophy and small vessel disease. The brain demonstrates no evidence of hemorrhage, infarction, edema, mass effect, extra-axial fluid collection, hydrocephalus or mass lesion. The skull is unremarkable. No evidence of skull fracture  CT CERVICAL SPINE FINDINGS  The cervical spine shows normal alignment. There is no  evidence of acute fracture or subluxation. No soft tissue swelling or hematoma is identified. Stable spondylosis of the cervical spine with multilevel disc disease, most prominently at C5-6 and C6-7. Stable anterolisthesis of C4 on C5 of approximately 3 mm. No bony or soft tissue lesions are seen. The visualized airway is normally patent.  IMPRESSION: No acute findings by head CT or cervical CT. Stable atrophy and small vessel disease of the brain. Stable multilevel spondylosis of the cervical spine.   Electronically Signed   By: Aletta Edouard M.D.   On: 08/23/2014 09:55   Dg Chest Portable 1 View  08/23/2014   CLINICAL DATA:  Hypoglycemia. Hypertension. Diabetes. Asthma. CHF.  EXAM: PORTABLE CHEST - 1 VIEW  COMPARISON:  None.  FINDINGS: Mediastinum and hilar structures normal. Cardiac pacer in stable position . Cardiomegaly with mild scratch pulmonary vascular and interstitial prominence consistent with congestive heart failure. No pleural effusion or pneumothorax. No acute bony abnormality.  IMPRESSION: 1. Mild congestive heart failure and pulmonary interstitial edema. 2. Cardiac pacer noted with lead tips in the right atrium and right ventricle.   Electronically Signed   By: [place]   On: 08/23/2014 11:17    Scheduled Meds: . atorvastatin  20 mg Oral q1800  . cholecalciferol  400 Units Oral QPM  . digoxin  0.125 mg Oral Daily  . diltiazem  180 mg Oral QHS  . docusate sodium  100 mg Oral BID  . doxazosin  2 mg Oral QHS  . finasteride  5 mg Oral Daily  . folic acid  0.5 mg Oral Daily  . furosemide  40 mg Oral BID  . insulin aspart  0-9 Units Subcutaneous 6 times per day  . insulin glargine  45 Units Subcutaneous Daily  . metoprolol  100 mg Oral BID  . pantoprazole  40 mg Oral Daily  . potassium chloride SA  20 mEq Oral Daily  . sodium chloride  3 mL Intravenous Q12H  . vitamin B-12  1,000 mcg Oral BID  . warfarin  5 mg Oral q1800  . Warfarin - Pharmacist Dosing Inpatient   Does  not apply q1800   Continuous Infusions:  Antibiotics Given (last 72 hours)    None      Principal Problem:   Hypoglycemia associated with type 2 diabetes mellitus Active Problems:   Chronic diastolic heart failure   Cardiac pacemaker in situ   Hypokalemia   Hypothermia   Seizure   Lactic  acidosis   Elevated troponin I level    Time spent: 35 min    Laurier Jasperson  [facility] Pager [phone] If 7PM-7AM, please contact night-coverage at [email or web address], password [facility] 08/24/2014, 8:27 AM  LOS: 1 day

## 2014-08-24 NOTE — Evaluation (Signed)
Physical Therapy Evaluation Patient Details Name: Paul Dalton MRN: [id number] DOB: 03-22-1930 Today's Date: 08/24/2014   History of Present Illness  Pt adm after seizure due to hypoglycemia. PMH - IDDM, HTN, CAD, CVA, chronic CHF, PPM  Clinical Impression  Pt doing well with mobility and no further PT needed.  Pt/wife feel pt is very close to baseline.      Follow Up Recommendations No PT follow up    Equipment Recommendations  None recommended by PT    Recommendations for Other Services       Precautions / Restrictions Precautions Precautions: Fall Restrictions Weight Bearing Restrictions: No      Mobility  Bed Mobility                  Transfers Overall transfer level: Modified independent Equipment used: None                Ambulation/Gait Ambulation/Gait assistance: Supervision Ambulation Distance (Feet): 225 Feet Assistive device: None Gait Pattern/deviations: Step-through pattern;Decreased stride length;Trunk flexed   Gait velocity interpretation: Below normal speed for age/gender General Gait Details: Slightly unsteady but no loss of balance.  Stairs            Wheelchair Mobility    Modified Rankin (Stroke Patients Only)       Balance Overall balance assessment: Needs assistance Sitting-balance support: No upper extremity supported;Feet supported Sitting balance-Leahy Scale: Good     Standing balance support: No upper extremity supported Standing balance-Leahy Scale: Good                               Pertinent Vitals/Pain Pain Assessment: No/denies pain    Home Living Family/patient expects to be discharged to:: Private residence Living Arrangements: Spouse/significant other Available Help at Discharge: Family;Available 24 hours/day Type of Home: House         Home Equipment: Kasandra Knudsen - single point      Prior Function Level of Independence: Independent with assistive device(s)          Comments: uses cane     Hand Dominance        Extremity/Trunk Assessment   Upper Extremity Assessment: Overall WFL for tasks assessed           Lower Extremity Assessment: Overall WFL for tasks assessed         Communication   Communication: No difficulties  Cognition Arousal/Alertness: Awake/alert Behavior During Therapy: WFL for tasks assessed/performed Overall Cognitive Status: Within Functional Limits for tasks assessed                      General Comments      Exercises        Assessment/Plan    PT Assessment Patent does not need any further PT services  PT Diagnosis Difficulty walking   PT Problem List    PT Treatment Interventions     PT Goals (Current goals can be found in the Care Plan section) Acute Rehab PT Goals PT Goal Formulation: All assessment and education complete, DC therapy    Frequency     Barriers to discharge        Co-evaluation               End of Session   Activity Tolerance: Patient tolerated treatment well Patient left: in bed;with family/visitor present (sitting EOB) Nurse Communication: Mobility status         Time:  8676-7209 PT Time Calculation (min) (ACUTE ONLY): 14 min   Charges:   PT Evaluation $Initial PT Evaluation Tier I: 1 Procedure     PT G Codes:        Paul Dalton 09-04-14, 10:03 AM  Suanne Marker PT [phone]

## 2014-08-24 NOTE — Progress Notes (Signed)
Received report. Awaiting patient's arrival.  

## 2014-08-24 NOTE — Progress Notes (Signed)
Report called to RN on 5W. WIll transfer via WC. Patient with no complaints at the current time. Dr Eliseo Squires paged about changing CBG to AC/HS since patient eating.

## 2014-08-24 NOTE — Progress Notes (Signed)
  Pt admitted to the unit. Pt is stable, alert and oriented per baseline. Oriented to room, staff, and call bell. Educated to call for any assistance. Bed in lowest position, call bell within reach- will continue to monitor. 

## 2014-08-25 LAB — GLUCOSE, CAPILLARY
GLUCOSE-CAPILLARY: 145 mg/dL — AB (ref 70–99)
GLUCOSE-CAPILLARY: 184 mg/dL — AB (ref 70–99)

## 2014-08-25 LAB — PROTIME-INR
INR: 1.88 — AB (ref 0.00–1.49)
Prothrombin Time: 21.8 seconds — ABNORMAL HIGH (ref 11.6–15.2)

## 2014-08-25 MED ORDER — INSULIN GLARGINE 100 UNIT/ML ~~LOC~~ SOLN: 45.0000 [IU] | Freq: Every day | SUBCUTANEOUS | Status: AC

## 2014-08-25 MED ORDER — GLUCAGON (RDNA) 1 MG IJ KIT: 1.0000 mg | PACK | Freq: Once | INTRAMUSCULAR | Status: AC | PRN

## 2014-08-25 NOTE — Discharge Summary (Signed)
Paul Dalton to be D/C'd Home per MD order.  Discussed with the patient and all questions fully answered.    Medication List    STOP taking these medications        insulin aspart 100 UNIT/ML injection  Commonly known as:  novoLOG     ondansetron 8 MG disintegrating tablet  Commonly known as:  ZOFRAN ODT      TAKE these medications        ACCU-CHEK AVIVA PLUS test strip  Generic drug:  glucose blood     Apple Cider Vinegar Tabs  Take 1 tablet by mouth 2 (two) times daily.     atorvastatin 40 MG tablet  Commonly known as:  LIPITOR  Take 20 mg by mouth daily.     cholecalciferol 400 UNITS Tabs tablet  Commonly known as:  VITAMIN D  Take 400 Units by mouth every evening.     Cinnamon 500 MG capsule  Take 500 mg by mouth 2 (two) times daily.     digoxin 0.125 MG tablet  Commonly known as:  LANOXIN  Take 1 tablet (0.125 mg total) by mouth daily.     diltiazem 180 MG 24 hr capsule  Commonly known as:  CARDIZEM CD  Take 1 capsule (180 mg total) by mouth at bedtime.     doxazosin 4 MG tablet  Commonly known as:  CARDURA  Take 2 mg by mouth at bedtime.     finasteride 5 MG tablet  Commonly known as:  PROSCAR  Take 5 mg by mouth daily.     folic acid 703 MCG tablet  Commonly known as:  FOLVITE  Take 400 mcg by mouth every evening.     furosemide 40 MG tablet  Commonly known as:  LASIX  Take 1 tablet (40 mg total) by mouth 2 (two) times daily.     glucagon 1 MG injection  Inject 1 mg into the vein once as needed.     glucosamine-chondroitin 500-400 MG tablet  Take 1 tablet by mouth 2 (two) times daily.     insulin glargine 100 UNIT/ML injection  Commonly known as:  LANTUS  Inject 0.45 mLs (45 Units total) into the skin daily.     metoprolol 100 MG tablet  Commonly known as:  LOPRESSOR  Take 100 mg by mouth 2 (two) times daily.     nitroGLYCERIN 0.4 MG SL tablet  Commonly known as:  NITROSTAT  - Place 0.4 mg under the tongue every 5 (five) minutes as  needed. CHEST PAIN  -      NON FORMULARY  O2 AT NIGHT     omeprazole 20 MG capsule  Commonly known as:  PRILOSEC  Take 20 mg by mouth 2 (two) times daily.     oxyCODONE-acetaminophen 5-325 MG per tablet  Commonly known as:  PERCOCET/ROXICET  Take 1 tablet by mouth every 4 (four) hours as needed for severe pain.     potassium chloride SA 20 MEQ tablet  Commonly known as:  KLOR-CON M20  Take 1 tablet (20 mEq total) by mouth daily.     sitaGLIPtin-metformin 50-1000 MG per tablet  Commonly known as:  JANUMET  Take 1 tablet by mouth 2 (two) times daily with a meal.     traMADol-acetaminophen 37.5-325 MG per tablet  Commonly known as:  ULTRACET  Take 1 tablet by mouth every 6 (six) hours as needed for moderate pain or severe pain.     vitamin B-12 1000 MCG tablet  Commonly known as:  CYANOCOBALAMIN  Take 1,000 mcg by mouth 2 (two) times daily.     warfarin 5 MG tablet  Commonly known as:  COUMADIN  Take 5 mg by mouth daily at 6 PM.        VVS, Skin clean, dry and intact without evidence of skin break down, no evidence of skin tears noted. IV catheter discontinued intact. Site without signs and symptoms of complications. Dressing and pressure applied.  An After Visit Summary was printed and given to the patient.  D/c education completed with patient/family including follow up instructions, medication list, d/c activities limitations if indicated, with other d/c instructions as indicated by MD - patient able to verbalize understanding, all questions fully answered.   Patient instructed to return to ED, call 911, or call MD for any changes in condition.   Patient escorted via [place], and D/C home via private auto.  Audria Nine F 08/25/2014 10:48 AM

## 2014-08-25 NOTE — Discharge Summary (Addendum)
Physician Discharge Summary  NICHOLES Dalton LKT:[id number] DOB: 08/02/1929 DOA: 08/23/2014  PCP: Horatio Pel, MD  Admit date: 08/23/2014 Discharge date: 08/25/2014  Time spent: 35 minutes  Recommendations for Outpatient Follow-up:  1. Monitor blood sugars and bring to PCP 2. Given glucagon kit 3. Provoked seizure, so no driving restrictions given besides caution when low blood sugars  Discharge Diagnoses:  Principal Problem:   Hypoglycemia associated with type 2 diabetes mellitus Active Problems:   Chronic diastolic heart failure   Cardiac pacemaker in situ   Hypokalemia   Hypothermia   Seizure   Lactic acidosis   Elevated troponin I level   Discharge Condition: improved  Diet recommendation: cardiac/diabetic  Filed Weights   08/23/14 0813  Weight: 108.863 kg (240 lb)    History of present illness:  Paul Dalton is a 81 y.o. 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 Paul Dalton was brought to the ER this morning by EMS after being found seizing by his wife. Paul Dalton reports that he has been feeling well lately, he went to bed feeling normally last night got up this morning and followed his usual routine of taking his 52 units of Lantus and then shaving and showering. In the shower he began to feel weak and attempted to call to his wife. His wife found him on the floor of the shower convulsing. She held his head and called EMS. Paul Dalton woke up on his living room floor while EMS was examining him. His CBG was 17. There is no report of bowel or bladder incontinence. He does appear to have a bite on his tongue. Paul Dalton denies taking any new medications, he is not on steroids, he has no previous history of seizures or extreme hypoglycemia. His CBG is normally around 110 - 120. He has been a diabetic for 15 years. His insulin dosage has not changed recently. His  weight has remained constant at approximately 240. He does not drink alcohol.  In the ER he was found to be hypothermic with a rectal temperature of 93.9. He was given D50 which restored his CBG to normal levels. His potassium was 2.4. Initial lactic acid was 2.57. Chest x-ray shows mild congestive heart failure.   Hospital Course:  DM with hypoglycemia: Unclear etiology given usual/good by mouth intake and unchanged chronic medications.  - reduced Lantus from 52 to 45 units daily, DC bedtime dose of NovoLog and place on NovoLog SSI. Monitor closely. -recently sick with diarrhea- ? Dehydration and AKI causing lantus to build up  Seizure 1: Prolonged seizure 1 likely secondary to hypoglycemia. CT head negative. PA discussed with neurologist on call who recommended no further workup or AEDs-agree. If he has seizure again in the absence of hypoglycemia, may need further workup and AED consideration.  Hypothermia:? Secondary to hypoglycemia. Resolved. No clinical focus of sepsis  Elevated lactate: resolved.  Hypokalemia: resolved, Mg ok  Chronic diastolic CHF: stable, continue home meds  Chronic anemia: Stable  Symptomatic bradycardia/complete heart block/A. fib/status post permanent pacemaker/mild-moderate aortic stenosis: Asymptomatic of chest pain or dyspnea. Minimally elevated troponin likely secondary to prolonged seizure activity. EKG with LBBB morphology. -tele ok   Procedures:    Consultations:  Diabetic coordinator  Discharge Exam: Filed Vitals:   08/25/14 0545  BP: 126/76  Pulse: 79  Temp: 98.2 F (36.8 C)  Resp: 18    General: A+Ox3, NAD Cardiovascular: rrr Respiratory:clear  Discharge  Instructions   Discharge Instructions    Diet - low sodium heart healthy    Complete by:  As directed      Diet Carb Modified    Complete by:  As directed      Discharge instructions    Complete by:  As directed   Keep monitoring glucose and bring to PCP     Increase  activity slowly    Complete by:  As directed           Current Discharge Medication List    START taking these medications   Details  glucagon 1 MG injection Inject 1 mg into the vein once as needed. Qty: 1 each, Refills: 12      CONTINUE these medications which have CHANGED   Details  insulin glargine (LANTUS) 100 UNIT/ML injection Inject 0.45 mLs (45 Units total) into the skin daily. Qty: 10 mL, Refills: 11      CONTINUE these medications which have NOT CHANGED   Details  ACCU-CHEK AVIVA PLUS test strip     atorvastatin (LIPITOR) 40 MG tablet Take 20 mg by mouth daily.     cholecalciferol (VITAMIN D) 400 UNITS TABS Take 400 Units by mouth every evening.     Cinnamon 500 MG capsule Take 500 mg by mouth 2 (two) times daily.     digoxin (LANOXIN) 0.125 MG tablet Take 1 tablet (0.125 mg total) by mouth daily. Qty: 90 tablet, Refills: 3   Associated Diagnoses: Atrial fibrillation    diltiazem (CARDIZEM CD) 180 MG 24 hr capsule Take 1 capsule (180 mg total) by mouth at bedtime. Qty: 90 capsule, Refills: 3   Associated Diagnoses: Atrial fibrillation    doxazosin (CARDURA) 4 MG tablet Take 2 mg by mouth at bedtime.     finasteride (PROSCAR) 5 MG tablet Take 5 mg by mouth daily.     folic acid (FOLVITE) 270 MCG tablet Take 400 mcg by mouth every evening.     furosemide (LASIX) 40 MG tablet Take 1 tablet (40 mg total) by mouth 2 (two) times daily. Qty: 180 tablet, Refills: 3   Associated Diagnoses: Chronic diastolic heart failure    glucosamine-chondroitin 500-400 MG tablet Take 1 tablet by mouth 2 (two) times daily.     metoprolol (LOPRESSOR) 100 MG tablet Take 100 mg by mouth 2 (two) times daily.     Misc Natural Products (APPLE CIDER VINEGAR) TABS Take 1 tablet by mouth 2 (two) times daily.     nitroGLYCERIN (NITROSTAT) 0.4 MG SL tablet Place 0.4 mg under the tongue every 5 (five) minutes as needed. CHEST PAIN     NON FORMULARY O2 AT NIGHT    omeprazole (PRILOSEC)  20 MG capsule Take 20 mg by mouth 2 (two) times daily.     oxyCODONE-acetaminophen (PERCOCET/ROXICET) 5-325 MG per tablet Take 1 tablet by mouth every 4 (four) hours as needed for severe pain. Qty: 20 tablet, Refills: 0    potassium chloride SA (KLOR-CON M20) 20 MEQ tablet Take 1 tablet (20 mEq total) by mouth daily. Qty: 90 tablet, Refills: 3    sitaGLIPtin-metformin (JANUMET) 50-1000 MG per tablet Take 1 tablet by mouth 2 (two) times daily with a meal.    traMADol-acetaminophen (ULTRACET) 37.5-325 MG per tablet Take 1 tablet by mouth every 6 (six) hours as needed for moderate pain or severe pain.    vitamin B-12 (CYANOCOBALAMIN) 1000 MCG tablet Take 1,000 mcg by mouth 2 (two) times daily.     warfarin (COUMADIN) 5  MG tablet Take 5 mg by mouth daily at 6 PM.    Associated Diagnoses: Atrial fibrillation      STOP taking these medications     insulin aspart (NOVOLOG) 100 UNIT/ML injection      ondansetron (ZOFRAN ODT) 8 MG disintegrating tablet        Allergies  Allergen Reactions  . Peanut-Containing Drug Products Anaphylaxis and Other (See Comments)    ONLY WALNUTS  . Nisoldipine Hives and Nausea And Vomiting  . Procardia [Nifedipine] Other (See Comments)    Headaches  . Sular [Nisoldipine Er] Hives and Nausea And Vomiting  . Viagra [Sildenafil Citrate] Other (See Comments)    Headaches  . Isosorbide Mononitrate Rash  . Sulfa Antibiotics Rash  . Vioxx [Rofecoxib] Palpitations  . Zithromax [Azithromycin] Rash   Follow-up Information    Follow up with Horatio Pel, MD In 1 week.   Specialty:  Internal Medicine   Contact information:   [address] [place] [address] [phone]        The results of significant diagnostics from this hospitalization (including imaging, microbiology, ancillary and laboratory) are listed below for reference.    Significant Diagnostic Studies: Ct Head Wo Contrast  08/23/2014   CLINICAL DATA:  [place] in  shower.  EXAM: CT HEAD WITHOUT CONTRAST  CT CERVICAL SPINE WITHOUT CONTRAST  TECHNIQUE: Multidetector CT imaging of the head and cervical spine was performed following the standard protocol without intravenous contrast. Multiplanar CT image reconstructions of the cervical spine were also generated.  COMPARISON:  05/26/2013  FINDINGS: CT HEAD FINDINGS  Stable atrophy and small vessel disease. The brain demonstrates no evidence of hemorrhage, infarction, edema, mass effect, extra-axial fluid collection, hydrocephalus or mass lesion. The skull is unremarkable. No evidence of skull fracture  CT CERVICAL SPINE FINDINGS  The cervical spine shows normal alignment. There is no evidence of acute fracture or subluxation. No soft tissue swelling or hematoma is identified. Stable spondylosis of the cervical spine with multilevel disc disease, most prominently at C5-6 and C6-7. Stable anterolisthesis of C4 on C5 of approximately 3 mm. No bony or soft tissue lesions are seen. The visualized airway is normally patent.  IMPRESSION: No acute findings by head CT or cervical CT. Stable atrophy and small vessel disease of the brain. Stable multilevel spondylosis of the cervical spine.   Electronically Signed   By: Aletta Edouard M.D.   On: 08/23/2014 09:55   Ct Cervical Spine Wo Contrast  08/23/2014   CLINICAL DATA:  [place] in shower.  EXAM: CT HEAD WITHOUT CONTRAST  CT CERVICAL SPINE WITHOUT CONTRAST  TECHNIQUE: Multidetector CT imaging of the head and cervical spine was performed following the standard protocol without intravenous contrast. Multiplanar CT image reconstructions of the cervical spine were also generated.  COMPARISON:  05/26/2013  FINDINGS: CT HEAD FINDINGS  Stable atrophy and small vessel disease. The brain demonstrates no evidence of hemorrhage, infarction, edema, mass effect, extra-axial fluid collection, hydrocephalus or mass lesion. The skull is unremarkable. No evidence of skull fracture  CT CERVICAL SPINE  FINDINGS  The cervical spine shows normal alignment. There is no evidence of acute fracture or subluxation. No soft tissue swelling or hematoma is identified. Stable spondylosis of the cervical spine with multilevel disc disease, most prominently at C5-6 and C6-7. Stable anterolisthesis of C4 on C5 of approximately 3 mm. No bony or soft tissue lesions are seen. The visualized airway is normally patent.  IMPRESSION: No acute findings by head CT or  cervical CT. Stable atrophy and small vessel disease of the brain. Stable multilevel spondylosis of the cervical spine.   Electronically Signed   By: Aletta Edouard M.D.   On: 08/23/2014 09:55   Dg Chest Portable 1 View  08/23/2014   CLINICAL DATA:  Hypoglycemia. Hypertension. Diabetes. Asthma. CHF.  EXAM: PORTABLE CHEST - 1 VIEW  COMPARISON:  None.  FINDINGS: Mediastinum and hilar structures normal. Cardiac pacer in stable position . Cardiomegaly with mild scratch pulmonary vascular and interstitial prominence consistent with congestive heart failure. No pleural effusion or pneumothorax. No acute bony abnormality.  IMPRESSION: 1. Mild congestive heart failure and pulmonary interstitial edema. 2. Cardiac pacer noted with lead tips in the right atrium and right ventricle.   Electronically Signed   By: Marcello Moores  Register   On: 08/23/2014 11:17    Microbiology: Recent Results (from the past 240 hour(s))  Blood culture (routine x 2)     Status: None (Preliminary result)   Collection Time: 08/23/14 11:19 AM  Result Value Ref Range Status   Specimen Description BLOOD RIGHT HAND  Final   Special Requests BOTTLES DRAWN AEROBIC AND ANAEROBIC 5CC  Final   Culture   Final           BLOOD CULTURE RECEIVED NO GROWTH TO DATE CULTURE WILL BE HELD FOR 5 DAYS BEFORE ISSUING A FINAL NEGATIVE REPORT Performed at [facility]    Report Status PENDING  Incomplete  Blood culture (routine x 2)     Status: None (Preliminary result)   Collection Time: 08/23/14 11:30 AM   Result Value Ref Range Status   Specimen Description BLOOD LEFT HAND  Final   Special Requests BOTTLES DRAWN AEROBIC AND ANAEROBIC 4CC  Final   Culture   Final           BLOOD CULTURE RECEIVED NO GROWTH TO DATE CULTURE WILL BE HELD FOR 5 DAYS BEFORE ISSUING A FINAL NEGATIVE REPORT Performed at [facility]    Report Status PENDING  Incomplete  MRSA PCR Screening     Status: None   Collection Time: 08/23/14  3:52 PM  Result Value Ref Range Status   MRSA by PCR NEGATIVE NEGATIVE Final    Comment:        The GeneXpert MRSA Assay (FDA approved for NASAL specimens only), is one component of a comprehensive MRSA colonization surveillance program. It is not intended to diagnose MRSA infection nor to guide or monitor treatment for MRSA infections.      Labs: Basic Metabolic Panel:  Recent Labs Lab 08/23/14 0820 08/23/14 1615 08/24/14 0309  NA 141  --  141  K 2.4*  --  4.3  CL 104  --  105  CO2 26  --  29  GLUCOSE 171*  --  157*  BUN 20  --  14  CREATININE 1.00  --  0.94  CALCIUM 9.0  --  9.0  MG  --  1.8  --    Liver Function Tests:  Recent Labs Lab 08/23/14 1615  AST 26  ALT 25  ALKPHOS 63  BILITOT 0.8  PROT 5.9*  ALBUMIN 3.4*   No results for input(s): LIPASE, AMYLASE in the last 168 hours. No results for input(s): AMMONIA in the last 168 hours. CBC:  Recent Labs Lab 08/23/14 0820 08/24/14 0309  WBC 8.8 6.2  NEUTROABS 6.4  --   HGB 11.3* 10.9*  HCT 33.1* 32.5*  MCV 95.9 97.0  PLT 144* 146*   Cardiac  Enzymes:  Recent Labs Lab 08/23/14 1615 08/23/14 2107 08/24/14 0309  CKTOTAL  --  107  --   TROPONINI 0.06* 0.05* 0.04*   BNP: BNP (last 3 results) No results for input(s): BNP in the last 8760 hours.  ProBNP (last 3 results) No results for input(s): PROBNP in the last 8760 hours.  CBG:  Recent Labs Lab 08/24/14 1244 08/24/14 1712 08/24/14 2155 08/25/14 0530 08/25/14 0757  GLUCAP 129* 251* 146* 145* 184*        Signed:  Ej Pinson  [facility] 08/25/2014, 8:04 AM

## 2014-08-28 DIAGNOSIS — R55 Syncope and collapse: Secondary | ICD-10-CM | POA: Diagnosis not present

## 2014-08-29 LAB — CULTURE, BLOOD (ROUTINE X 2)
Culture: NO GROWTH
Culture: NO GROWTH

## 2014-09-03 DIAGNOSIS — I482 Chronic atrial fibrillation: Secondary | ICD-10-CM | POA: Diagnosis not present

## 2014-09-03 DIAGNOSIS — Z7901 Long term (current) use of anticoagulants: Secondary | ICD-10-CM | POA: Diagnosis not present

## 2014-09-04 DIAGNOSIS — R3915 Urgency of urination: Secondary | ICD-10-CM | POA: Diagnosis not present

## 2014-09-04 DIAGNOSIS — N401 Enlarged prostate with lower urinary tract symptoms: Secondary | ICD-10-CM | POA: Diagnosis not present

## 2014-09-04 DIAGNOSIS — R350 Frequency of micturition: Secondary | ICD-10-CM | POA: Diagnosis not present

## 2014-09-04 DIAGNOSIS — N200 Calculus of kidney: Secondary | ICD-10-CM | POA: Diagnosis not present

## 2014-09-16 DIAGNOSIS — I259 Chronic ischemic heart disease, unspecified: Secondary | ICD-10-CM | POA: Diagnosis not present

## 2014-09-16 DIAGNOSIS — I10 Essential (primary) hypertension: Secondary | ICD-10-CM | POA: Diagnosis not present

## 2014-09-16 DIAGNOSIS — I209 Angina pectoris, unspecified: Secondary | ICD-10-CM | POA: Diagnosis not present

## 2014-10-01 DIAGNOSIS — I482 Chronic atrial fibrillation: Secondary | ICD-10-CM | POA: Diagnosis not present

## 2014-10-01 DIAGNOSIS — Z7901 Long term (current) use of anticoagulants: Secondary | ICD-10-CM | POA: Diagnosis not present

## 2014-10-09 DIAGNOSIS — E1165 Type 2 diabetes mellitus with hyperglycemia: Secondary | ICD-10-CM | POA: Diagnosis not present

## 2014-10-16 DIAGNOSIS — I209 Angina pectoris, unspecified: Secondary | ICD-10-CM | POA: Diagnosis not present

## 2014-10-16 DIAGNOSIS — I259 Chronic ischemic heart disease, unspecified: Secondary | ICD-10-CM | POA: Diagnosis not present

## 2014-10-16 DIAGNOSIS — I10 Essential (primary) hypertension: Secondary | ICD-10-CM | POA: Diagnosis not present

## 2014-10-16 DIAGNOSIS — E1165 Type 2 diabetes mellitus with hyperglycemia: Secondary | ICD-10-CM | POA: Diagnosis not present

## 2014-10-31 ENCOUNTER — Ambulatory Visit (INDEPENDENT_AMBULATORY_CARE_PROVIDER_SITE_OTHER): Payer: Medicare Other | Admitting: *Deleted

## 2014-10-31 DIAGNOSIS — I482 Chronic atrial fibrillation, unspecified: Secondary | ICD-10-CM

## 2014-10-31 DIAGNOSIS — Z950 Presence of cardiac pacemaker: Secondary | ICD-10-CM

## 2014-10-31 LAB — CUP PACEART INCLINIC DEVICE CHECK
Battery Impedance: 3000 Ohm
Battery Voltage: 2.76 V
Date Time Interrogation Session: 20160518110513
Lead Channel Pacing Threshold Amplitude: 0.5 V
Lead Channel Pacing Threshold Pulse Width: 0.5 ms
Lead Channel Setting Sensing Sensitivity: 2 mV
MDC IDC MSMT LEADCHNL RV IMPEDANCE VALUE: 574 Ohm
MDC IDC SET LEADCHNL RV PACING PULSEWIDTH: 0.5 ms
MDC IDC STAT BRADY RV PERCENT PACED: 99 % — AB
Pulse Gen Model: 5626
Pulse Gen Serial Number: 2041208

## 2014-10-31 NOTE — Progress Notes (Signed)
Pacemaker check in clinic. Normal device function. Threshold,  impedances consistent with previous measurements. Device programmed to maximize longevity. Device programmed at appropriate safety margins. Histogram distribution appropriate for patient activity level. Device programmed to optimize intrinsic conduction. Estimated longevity 3.25-4.29yrs. Pt c/o of DOE for past few months. Pt advised to contact PCP since pacer check was normal. ROV w/ GT in 43mo.

## 2014-11-01 DIAGNOSIS — R0602 Shortness of breath: Secondary | ICD-10-CM | POA: Diagnosis not present

## 2014-11-01 DIAGNOSIS — I5022 Chronic systolic (congestive) heart failure: Secondary | ICD-10-CM | POA: Diagnosis not present

## 2014-11-01 DIAGNOSIS — R069 Unspecified abnormalities of breathing: Secondary | ICD-10-CM | POA: Diagnosis not present

## 2014-11-01 DIAGNOSIS — R0609 Other forms of dyspnea: Secondary | ICD-10-CM | POA: Diagnosis not present

## 2014-11-05 ENCOUNTER — Other Ambulatory Visit (HOSPITAL_COMMUNITY): Payer: Self-pay | Admitting: Respiratory Therapy

## 2014-11-05 DIAGNOSIS — I5022 Chronic systolic (congestive) heart failure: Secondary | ICD-10-CM | POA: Diagnosis not present

## 2014-11-05 DIAGNOSIS — R0600 Dyspnea, unspecified: Secondary | ICD-10-CM

## 2014-11-06 DIAGNOSIS — I5022 Chronic systolic (congestive) heart failure: Secondary | ICD-10-CM | POA: Diagnosis not present

## 2014-11-13 ENCOUNTER — Encounter: Payer: Self-pay | Admitting: Internal Medicine

## 2014-11-15 DIAGNOSIS — M5033 Other cervical disc degeneration, cervicothoracic region: Secondary | ICD-10-CM | POA: Diagnosis not present

## 2014-11-15 DIAGNOSIS — Z8679 Personal history of other diseases of the circulatory system: Secondary | ICD-10-CM | POA: Diagnosis not present

## 2014-11-15 DIAGNOSIS — Z7901 Long term (current) use of anticoagulants: Secondary | ICD-10-CM | POA: Diagnosis not present

## 2014-11-15 DIAGNOSIS — M19011 Primary osteoarthritis, right shoulder: Secondary | ICD-10-CM | POA: Diagnosis not present

## 2014-11-15 DIAGNOSIS — I5022 Chronic systolic (congestive) heart failure: Secondary | ICD-10-CM | POA: Diagnosis not present

## 2014-11-15 DIAGNOSIS — I482 Chronic atrial fibrillation: Secondary | ICD-10-CM | POA: Diagnosis not present

## 2014-11-16 ENCOUNTER — Ambulatory Visit (HOSPITAL_COMMUNITY)
Admission: RE | Admit: 2014-11-16 | Discharge: 2014-11-16 | Disposition: A | Discharge status: Home / Self Care | Payer: Medicare Other | Source: Ambulatory Visit | Attending: Internal Medicine | Admitting: Internal Medicine

## 2014-11-16 ENCOUNTER — Other Ambulatory Visit (HOSPITAL_COMMUNITY): Payer: Self-pay | Admitting: Respiratory Therapy

## 2014-11-16 ENCOUNTER — Ambulatory Visit (HOSPITAL_COMMUNITY): Admission: RE | Admit: 2014-11-16 | Payer: Medicare Other | Source: Ambulatory Visit

## 2014-11-16 DIAGNOSIS — R0600 Dyspnea, unspecified: Secondary | ICD-10-CM | POA: Diagnosis not present

## 2014-11-16 LAB — PULMONARY FUNCTION TEST
FEF 25-75 Pre: 2.52 L/sec
FEF2575-%Pred-Pre: 104 %
FEV1-%Pred-Pre: 74 %
FEV1-PRE: 2.65 L
FEV1FVC-%Pred-Pre: 107 %
FEV6-%Pred-Pre: 72 %
FEV6-Pre: 3.39 L
FEV6FVC-%Pred-Pre: 106 %
FVC-%PRED-PRE: 70 %
FVC-PRE: 3.47 L
Pre FEV1/FVC ratio: 77 %
Pre FEV6/FVC Ratio: 100 %

## 2014-11-22 DIAGNOSIS — I5022 Chronic systolic (congestive) heart failure: Secondary | ICD-10-CM | POA: Diagnosis not present

## 2014-11-22 DIAGNOSIS — M25519 Pain in unspecified shoulder: Secondary | ICD-10-CM | POA: Diagnosis not present

## 2014-11-28 ENCOUNTER — Ambulatory Visit (INDEPENDENT_AMBULATORY_CARE_PROVIDER_SITE_OTHER): Payer: Medicare Other | Admitting: Neurology

## 2014-11-28 ENCOUNTER — Encounter: Payer: Self-pay | Admitting: Neurology

## 2014-11-28 VITALS — BP 117/69 | HR 69 | Ht 76.0 in | Wt 233.0 lb

## 2014-11-28 DIAGNOSIS — M542 Cervicalgia: Secondary | ICD-10-CM | POA: Diagnosis not present

## 2014-11-28 DIAGNOSIS — M545 Low back pain: Secondary | ICD-10-CM

## 2014-11-28 DIAGNOSIS — R269 Unspecified abnormalities of gait and mobility: Secondary | ICD-10-CM

## 2014-11-28 DIAGNOSIS — R202 Paresthesia of skin: Secondary | ICD-10-CM | POA: Diagnosis not present

## 2014-11-28 MED ORDER — OXYCODONE-ACETAMINOPHEN 5-325 MG PO TABS: 1.0000 | ORAL_TABLET | Freq: Three times a day (TID) | ORAL | Status: AC | PRN

## 2014-11-28 NOTE — Progress Notes (Signed)
PATIENT: Paul Dalton DOB: [birth year]  Chief Complaint  Patient presents with  . Peripheral Neuropathy    Reports pain in bilateral feet that radiates up into his calves.  His right side is worse than left.  . Cervical Pain    He is having cervical pain that raditates into both arms and hands.  He has intermittent numbness in his right hand.    HISTORICAL  Paul Dalton is 81 yo RH male, accompanied by his wife, seen in consultation from his primary care physician Dr. Shelia Media , and orthopedic surgeon PA Gerrit Halls for evaluation of bilateral feet pain, neck pain  He had a history of hypertension, hyperlipidemia, atrial fibrillation, on chronic Coumadin treatment, pacemaker placement in 2005, congestive heart failure, diabetes, insulin-dependent, obstructive sleep apnea, previous history of stroke, he is a retired [employer] manager, still visit sick and people at nursing home regularly.  He had a chronic neck pain, also had a multiple degenerative joint disease, chronic bilateral knee pain, right shoulder pain,getting worse since January 2016,he complains of radiating pain from right side of the neck, to his right shoulder, often woke him up from nighttime sleep, he has to change position, shakiness hand to make the right hand sensation comes back, similar but much less frequent involvement of left hand, he felt his hands are swollen, stiff.  He also complains of chronic low back pain since he had a foreign accident more than 30 years ago, recent few months, he began to shoot down to his left leg, involving left plantar foot, getting worse after bearing weight, previously was treated with frequent epidural injection, but seems to pack chew and its benefit.  He also complains of nocturia, urinary frequency, bilateral feet numbness, tingling,  He is already referred to pain management Dr. Nelva Bush, physical therapy next week  He has tried over-the-counter Tylenol,  Tramadol,with limited help,  I  have personally reviewed CTs in March 2016, CT head showed stable atrophy, small vessel disease, CT cervical spine showed multilevel spondylosis of cervical spine. most prominently at C5-6 and C6-7. Stable anterolisthesis of C4 on C5 of approximately 3 mm. No bony or soft tissue lesions are see   REVIEW OF SYSTEMS: Full 14 system review of systems performed and notable only for as above  ALLERGIES: Allergies  Allergen Reactions  . Peanut-Containing Drug Products Anaphylaxis and Other (See Comments)    ONLY WALNUTS  . Nisoldipine Hives and Nausea And Vomiting  . Procardia [Nifedipine] Other (See Comments)    Headaches  . Sular [Nisoldipine Er] Hives and Nausea And Vomiting  . Viagra [Sildenafil Citrate] Other (See Comments)    Headaches  . Isosorbide Mononitrate Rash  . Sulfa Antibiotics Rash  . Vioxx [Rofecoxib] Palpitations  . Zithromax [Azithromycin] Rash    HOME MEDICATIONS: Current Outpatient Prescriptions  Medication Sig Dispense Refill  . ACCU-CHEK AVIVA PLUS test strip     . atorvastatin (LIPITOR) 40 MG tablet Take 20 mg by mouth daily.     . cholecalciferol (VITAMIN D) 400 UNITS TABS Take 400 Units by mouth every evening.     . Cinnamon 500 MG capsule Take 500 mg by mouth 2 (two) times daily.     . digoxin (LANOXIN) 0.125 MG tablet Take 1 tablet (0.125 mg total) by mouth daily. 90 tablet 3  . diltiazem (CARDIZEM CD) 180 MG 24 hr capsule Take 1 capsule (180 mg total) by mouth at bedtime. 90 capsule 3  . doxazosin (CARDURA) 4 MG tablet Take  2 mg by mouth at bedtime.     . finasteride (PROSCAR) 5 MG tablet Take 5 mg by mouth daily.     . folic acid (FOLVITE) 161 MCG tablet Take 400 mcg by mouth every evening.     . furosemide (LASIX) 40 MG tablet Take 1 tablet (40 mg total) by mouth 2 (two) times daily. 180 tablet 3  . glucagon 1 MG injection Inject 1 mg into the vein once as needed. 1 each 12  . glucosamine-chondroitin 500-400 MG tablet Take 1 tablet by mouth 2 (two) times  daily.     Marland Kitchen HUMALOG KWIKPEN 100 UNIT/ML KiwkPen 5 Units 2 (two) times daily.     . insulin glargine (LANTUS) 100 UNIT/ML injection Inject 0.45 mLs (45 Units total) into the skin daily. 10 mL 11  . metoprolol (LOPRESSOR) 100 MG tablet Take 100 mg by mouth 2 (two) times daily.     . Misc Natural Products (APPLE CIDER VINEGAR) TABS Take 1 tablet by mouth 2 (two) times daily.     . nitroGLYCERIN (NITROSTAT) 0.4 MG SL tablet Place 0.4 mg under the tongue every 5 (five) minutes as needed. CHEST PAIN     . NON FORMULARY O2 AT NIGHT    . omeprazole (PRILOSEC) 20 MG capsule Take 20 mg by mouth 2 (two) times daily.     Marland Kitchen oxyCODONE-acetaminophen (PERCOCET/ROXICET) 5-325 MG per tablet Take 1 tablet by mouth every 4 (four) hours as needed for severe pain. 20 tablet 0  . potassium chloride SA (KLOR-CON M20) 20 MEQ tablet Take 1 tablet (20 mEq total) by mouth daily. 90 tablet 3  . sitaGLIPtin-metformin (JANUMET) 50-1000 MG per tablet Take 1 tablet by mouth 2 (two) times daily with a meal.    . spironolactone (ALDACTONE) 25 MG tablet     . traMADol-acetaminophen (ULTRACET) 37.5-325 MG per tablet Take 1 tablet by mouth every 6 (six) hours as needed for moderate pain or severe pain.    . vitamin B-12 (CYANOCOBALAMIN) 1000 MCG tablet Take 1,000 mcg by mouth 2 (two) times daily.     Marland Kitchen warfarin (COUMADIN) 5 MG tablet Take 5 mg by mouth daily at 6 PM.      No current facility-administered medications for this visit.    PAST MEDICAL HISTORY: Past Medical History  Diagnosis Date  . Angina   . Coronary artery disease   . Heart murmur   . Hypertension     hyperlipidemia  . Shortness of breath     with exertion  . Recurrent upper respiratory infection (URI)     statrted with cold symptoms 05/30/11- large amount clear drainage out of nose, with cough- non productive- states chest hurts from coughing so much.  ?fever last PM  . Sleep apnea     last study years ago- SEVERS PER PATIENT- does not wear mask  .  Chronic kidney disease   . Renal lithiasis     states in both kidneys, hematuria  . H/O hiatal hernia   . Headache(784.0)     history of migraines  . Stroke     3 mini strokes-last one 1 1/2 yrs ago  . Cancer     skin cancer left ear  . Arthritis   . 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 Dysrhythmia     atrial fibrilliation,s/p PPM,bradycardia s/p PPM, hx tachy-brady syndrome. LAST OV WITH INTERROGATION ON CHART. Clearance Dr  Lovena Le with ok to stop coumadin 5 days pre op on chart  . Myocardial infarction     states silent heart attack years ago, but no damage-- stress test 2009 on chart  . Hematuria   . Syncope and collapse 05/26/13    ER visit 05/26/13 due to syncope. Possibly due to doxazosin.  . Diabetes mellitus with neurological manifestations, uncontrolled   . Edema   . Impotence of organic origin     ED  . Peptic ulcer, unspecified site, unspecified as acute or chronic, without mention of hemorrhage or perforation, with obstruction   . Pure hypercholesterolemia   . Chronic ischemic heart disease, unspecified   . Anemia, unspecified   . Asthmatic bronchitis   . AAA (abdominal aortic aneurysm)   . Carotid stenosis   . BPH (benign prostatic hyperplasia)   . Hemorrhoids   . LBBB (left bundle branch block)   . Eczema   . Actinic keratoses   . Nephrolithiasis   . Vitamin B12 deficiency   . Allergic rhinitis   . History of MI (myocardial infarction)   . Osteoarthritis   . Esophageal reflux   . CHF (congestive heart failure)   . History of TIA (transient ischemic attack)   . Polymyalgia rheumatica   . Low back pain   . Hard of hearing   . Anaphylactic reaction     Tree nut (WALNUT ONLY)  . Coronary artery ectasia   . Carotid bruit   . Stable angina   . Dyspnea   . At high risk for falls   . Syncope   . Back pain   . Peripheral neuropathic pain     PAST SURGICAL HISTORY: Past  Surgical History  Procedure Laterality Date  . Insert / replace / remove pacemaker      St Jude- last interrogation 7/12- followed by Dr Lovena Le  . Cardiac catheterization      x 2 -years ago  . Hemorrhoid surgery    . Eye surgery      bilateral cataract extraction with IOL  . Appendectomy    . Hernia repair      bilateral inguinal  . Rotator cuff repair      right  . Doppler echocardiography  2009  . Ureteroscopy  06/24/2011    Procedure: URETEROSCOPY;  Surgeon: Molli Hazard, MD;  Location: [facility];  Service: Urology;  Laterality: Left;  . Cystoscopy w/ retrogrades  06/24/2011    Procedure: CYSTOSCOPY WITH RETROGRADE PYELOGRAM;  Surgeon: Molli Hazard, MD;  Location: [facility];  Service: Urology;  Laterality: Left;  . Cardioversion  03/1998  . Left and right heart catheterization with coronary angiogram N/A 09/22/2013    Procedure: LEFT AND RIGHT HEART CATHETERIZATION WITH CORONARY ANGIOGRAM ;  Surgeon: Peter M Martinique, MD;  Location: [facility] CATH LAB;  Service: Cardiovascular;  Laterality: N/A;    FAMILY HISTORY: Family History  Problem Relation Age of Onset  . Heart disease Mother   . CVA Father     SOCIAL HISTORY:  History   Social History  . Marital Status: Married    Spouse Name: N/A  . Number of Children: 3  . Years of Education: 16   Occupational History  . Retired    Social History Main Topics  . Smoking status: Former Smoker -- 30 years    Types: Cigarettes, Pipe, Cigars    Quit date: 06/01/1987  . Smokeless tobacco: Never Used  . Alcohol Use: No  . Drug Use: No  .  Sexual Activity: Not on file   Other Topics Concern  . Not on file   Social History Narrative   Lives at home with wife, Stanton Kidney.   Right-handed.   3 cups caffeine per day.     PHYSICAL EXAM   Filed Vitals:   11/28/14 1531  BP: 117/69  Pulse: 69  Height: 6\' 4"  (1.93 m)  Weight: 233 lb (105.688 kg)    Not recorded      Body mass index is 28.37 kg/(m^2).  PHYSICAL  EXAMNIATION:  Gen: NAD, conversant, well nourised, obese, well groomed                     Cardiovascular: Regular rate rhythm, no peripheral edema, warm, nontender. Eyes: Conjunctivae clear without exudates or hemorrhage Neck: Supple, no carotid bruise. Pulmonary: Clear to auscultation bilaterally   NEUROLOGICAL EXAM:  MENTAL STATUS: Speech:    Speech is normal; fluent and spontaneous with normal comprehension.  Cognition:    The patient is oriented to person, place, and time;     recent and remote memory intact;     language fluent;     normal attention, concentration,     fund of knowledge.  CRANIAL NERVES: CN II: Visual fields are full to confrontation. Fundoscopic exam is normal  Pupils are 4 mm and briskly reactive to light. Visual acuity is 20/20 bilaterally. CN III, IV, VI: extraocular movement are normal. No ptosis. CN V: Facial sensation is intact to pinprick in all 3 divisions bilaterally. Corneal responses are intact.  CN VII: Face is symmetric with normal eye closure and smile. CN VIII: Hearing is normal to rubbing fingers CN IX, X: Palate elevates symmetrically. Phonation is normal. CN XI: Head turning and shoulder shrug are intact CN XII: Tongue is midline with normal movements and no atrophy.  MOTOR: There is no pronator drift of out-stretched arms. Muscle bulk and tone are normal. Muscle strength is normal, with exception of mild bilateral toe extension weakness REFLEXES: Reflexes are 2+ and symmetric at the biceps, triceps,absent at bilateral knees, and ankles. Plantar responses are flexor.  SENSORY: Length dependent decreased light touch, pinprick, and vibration sense at distal leg.  COORDINATION: Rapid alternating movements and fine finger movements are intact. There is no dysmetria on finger-to-nose and heel-knee-shin. There are no abnormal or extraneous movements.   GAIT/STANCE: Kyphosis, wide-based, unsteady, antalgic gait   DIAGNOSTIC DATA (LABS,  IMAGING, TESTING) - I reviewed patient records, labs, notes, testing and imaging myself where available.  Lab Results  Component Value Date   WBC 6.2 08/24/2014   HGB 10.9* 08/24/2014   HCT 32.5* 08/24/2014   MCV 97.0 08/24/2014   PLT 146* 08/24/2014      Component Value Date/Time   NA 141 08/24/2014 0309   K 4.3 08/24/2014 0309   CL 105 08/24/2014 0309   CO2 29 08/24/2014 0309   GLUCOSE 157* 08/24/2014 0309   BUN 14 08/24/2014 0309   CREATININE 0.94 08/24/2014 0309   CALCIUM 9.0 08/24/2014 0309   PROT 5.9* 08/23/2014 1615   ALBUMIN 3.4* 08/23/2014 1615   AST 26 08/23/2014 1615   ALT 25 08/23/2014 1615   ALKPHOS 63 08/23/2014 1615   BILITOT 0.8 08/23/2014 1615   GFRNONAA 76* 08/24/2014 0309   GFRAA 88* 08/24/2014 0309   No results found for: CHOL, HDL, LDLCALC, LDLDIRECT, TRIG, CHOLHDL Lab Results  Component Value Date   HGBA1C 6.8* 08/23/2014   No results found for: DXAJOINO67 Lab Results  Component  Value Date   TSH 1.004 08/23/2014      ASSESSMENT AND PLAN  Paul Dalton is a 81 y.o. male    1, chronic neck pain, bilateral hands paresthesia, suggestive of bilateral cervical radiculopathy, likely a component of carpal tunnel syndromes. 2, diabetic peripheral neuropathy 3. Gait difficulty, multifactors, due to knee pain, low back pain, neck pain, distal sensory loss, mild distal weakness 4. Proceed his physical therapy, pain management, return to clinic in 3 months  Marcial Pacas, M.D. Ph.D.  [facility] [address] [place], [place] [address] Ph: [phone] Fax: [phone]

## 2014-12-04 ENCOUNTER — Ambulatory Visit (HOSPITAL_COMMUNITY)
Admission: RE | Admit: 2014-12-04 | Discharge: 2014-12-04 | Disposition: A | Discharge status: Home / Self Care | Payer: Medicare Other | Source: Ambulatory Visit | Attending: Internal Medicine | Admitting: Internal Medicine

## 2014-12-04 ENCOUNTER — Other Ambulatory Visit (HOSPITAL_COMMUNITY): Payer: Self-pay | Admitting: Internal Medicine

## 2014-12-04 DIAGNOSIS — M79604 Pain in right leg: Secondary | ICD-10-CM | POA: Insufficient documentation

## 2014-12-04 DIAGNOSIS — M79605 Pain in left leg: Secondary | ICD-10-CM | POA: Diagnosis not present

## 2014-12-04 DIAGNOSIS — R609 Edema, unspecified: Secondary | ICD-10-CM | POA: Diagnosis not present

## 2014-12-04 DIAGNOSIS — R600 Localized edema: Secondary | ICD-10-CM | POA: Diagnosis not present

## 2014-12-04 DIAGNOSIS — I5022 Chronic systolic (congestive) heart failure: Secondary | ICD-10-CM | POA: Diagnosis not present

## 2014-12-04 DIAGNOSIS — Z7901 Long term (current) use of anticoagulants: Secondary | ICD-10-CM | POA: Diagnosis not present

## 2014-12-04 DIAGNOSIS — I482 Chronic atrial fibrillation: Secondary | ICD-10-CM | POA: Diagnosis not present

## 2014-12-04 NOTE — Progress Notes (Addendum)
Preliminary results by tech - Bilateral Lower Ext. Venous Duplex Completed. Positive for deep vein thrombosis involving the right posterior tibial veins; and superficial vein thrombosis in the right mid calf  great saphenous vein. There is no evidence of deep or superficial vein thrombosis in the left lower extremity.  Results given to [facility], [place].  Oda Cogan, BS, RDMS, RVT

## 2014-12-06 ENCOUNTER — Other Ambulatory Visit: Payer: Self-pay | Admitting: Internal Medicine

## 2014-12-06 DIAGNOSIS — R109 Unspecified abdominal pain: Secondary | ICD-10-CM

## 2014-12-06 DIAGNOSIS — I82409 Acute embolism and thrombosis of unspecified deep veins of unspecified lower extremity: Secondary | ICD-10-CM | POA: Diagnosis not present

## 2014-12-06 DIAGNOSIS — I509 Heart failure, unspecified: Secondary | ICD-10-CM | POA: Diagnosis not present

## 2014-12-06 DIAGNOSIS — R634 Abnormal weight loss: Secondary | ICD-10-CM

## 2014-12-06 DIAGNOSIS — Z125 Encounter for screening for malignant neoplasm of prostate: Secondary | ICD-10-CM | POA: Diagnosis not present

## 2014-12-06 DIAGNOSIS — I517 Cardiomegaly: Secondary | ICD-10-CM | POA: Diagnosis not present

## 2014-12-06 DIAGNOSIS — Z7901 Long term (current) use of anticoagulants: Secondary | ICD-10-CM | POA: Diagnosis not present

## 2014-12-06 DIAGNOSIS — K5900 Constipation, unspecified: Secondary | ICD-10-CM

## 2014-12-09 ENCOUNTER — Inpatient Hospital Stay (HOSPITAL_COMMUNITY): Payer: Medicare Other

## 2014-12-09 ENCOUNTER — Encounter (HOSPITAL_COMMUNITY): Payer: Self-pay | Admitting: Emergency Medicine

## 2014-12-09 ENCOUNTER — Inpatient Hospital Stay (HOSPITAL_COMMUNITY): Payer: Medicare Other | Admitting: Anesthesiology

## 2014-12-09 ENCOUNTER — Inpatient Hospital Stay (HOSPITAL_COMMUNITY)
Admission: EM | Admit: 2014-12-09 | Discharge: 2015-01-14 | DRG: 040 | Disposition: E | Payer: Medicare Other | Attending: Neurology | Admitting: Neurology

## 2014-12-09 ENCOUNTER — Emergency Department (HOSPITAL_COMMUNITY): Payer: Medicare Other

## 2014-12-09 ENCOUNTER — Encounter (HOSPITAL_COMMUNITY): Admission: EM | Disposition: E | Payer: Self-pay | Source: Home / Self Care | Attending: Neurology

## 2014-12-09 DIAGNOSIS — R011 Cardiac murmur, unspecified: Secondary | ICD-10-CM | POA: Diagnosis not present

## 2014-12-09 DIAGNOSIS — Z888 Allergy status to other drugs, medicaments and biological substances status: Secondary | ICD-10-CM

## 2014-12-09 DIAGNOSIS — J9601 Acute respiratory failure with hypoxia: Secondary | ICD-10-CM | POA: Diagnosis not present

## 2014-12-09 DIAGNOSIS — Z7901 Long term (current) use of anticoagulants: Secondary | ICD-10-CM

## 2014-12-09 DIAGNOSIS — E1165 Type 2 diabetes mellitus with hyperglycemia: Secondary | ICD-10-CM | POA: Diagnosis not present

## 2014-12-09 DIAGNOSIS — G935 Compression of brain: Secondary | ICD-10-CM | POA: Diagnosis present

## 2014-12-09 DIAGNOSIS — I252 Old myocardial infarction: Secondary | ICD-10-CM

## 2014-12-09 DIAGNOSIS — J45909 Unspecified asthma, uncomplicated: Secondary | ICD-10-CM | POA: Diagnosis present

## 2014-12-09 DIAGNOSIS — I82402 Acute embolism and thrombosis of unspecified deep veins of left lower extremity: Secondary | ICD-10-CM | POA: Diagnosis not present

## 2014-12-09 DIAGNOSIS — I2510 Atherosclerotic heart disease of native coronary artery without angina pectoris: Secondary | ICD-10-CM | POA: Diagnosis present

## 2014-12-09 DIAGNOSIS — Z8673 Personal history of transient ischemic attack (TIA), and cerebral infarction without residual deficits: Secondary | ICD-10-CM | POA: Diagnosis not present

## 2014-12-09 DIAGNOSIS — R4781 Slurred speech: Secondary | ICD-10-CM | POA: Diagnosis not present

## 2014-12-09 DIAGNOSIS — E780 Pure hypercholesterolemia: Secondary | ICD-10-CM | POA: Diagnosis present

## 2014-12-09 DIAGNOSIS — K219 Gastro-esophageal reflux disease without esophagitis: Secondary | ICD-10-CM | POA: Diagnosis not present

## 2014-12-09 DIAGNOSIS — I6782 Cerebral ischemia: Secondary | ICD-10-CM | POA: Diagnosis not present

## 2014-12-09 DIAGNOSIS — M1990 Unspecified osteoarthritis, unspecified site: Secondary | ICD-10-CM | POA: Diagnosis present

## 2014-12-09 DIAGNOSIS — R4701 Aphasia: Secondary | ICD-10-CM | POA: Diagnosis not present

## 2014-12-09 DIAGNOSIS — G319 Degenerative disease of nervous system, unspecified: Secondary | ICD-10-CM | POA: Diagnosis not present

## 2014-12-09 DIAGNOSIS — Z794 Long term (current) use of insulin: Secondary | ICD-10-CM | POA: Diagnosis not present

## 2014-12-09 DIAGNOSIS — R531 Weakness: Secondary | ICD-10-CM | POA: Diagnosis not present

## 2014-12-09 DIAGNOSIS — N289 Disorder of kidney and ureter, unspecified: Secondary | ICD-10-CM | POA: Diagnosis present

## 2014-12-09 DIAGNOSIS — Z8249 Family history of ischemic heart disease and other diseases of the circulatory system: Secondary | ICD-10-CM | POA: Diagnosis not present

## 2014-12-09 DIAGNOSIS — E43 Unspecified severe protein-calorie malnutrition: Secondary | ICD-10-CM | POA: Diagnosis not present

## 2014-12-09 DIAGNOSIS — Z452 Encounter for adjustment and management of vascular access device: Secondary | ICD-10-CM

## 2014-12-09 DIAGNOSIS — I63412 Cerebral infarction due to embolism of left middle cerebral artery: Secondary | ICD-10-CM | POA: Diagnosis not present

## 2014-12-09 DIAGNOSIS — I82401 Acute embolism and thrombosis of unspecified deep veins of right lower extremity: Secondary | ICD-10-CM | POA: Diagnosis not present

## 2014-12-09 DIAGNOSIS — Z86718 Personal history of other venous thrombosis and embolism: Secondary | ICD-10-CM

## 2014-12-09 DIAGNOSIS — E538 Deficiency of other specified B group vitamins: Secondary | ICD-10-CM | POA: Diagnosis present

## 2014-12-09 DIAGNOSIS — I509 Heart failure, unspecified: Secondary | ICD-10-CM | POA: Diagnosis not present

## 2014-12-09 DIAGNOSIS — Z91010 Allergy to peanuts: Secondary | ICD-10-CM

## 2014-12-09 DIAGNOSIS — G936 Cerebral edema: Secondary | ICD-10-CM | POA: Diagnosis not present

## 2014-12-09 DIAGNOSIS — E1149 Type 2 diabetes mellitus with other diabetic neurological complication: Secondary | ICD-10-CM | POA: Diagnosis not present

## 2014-12-09 DIAGNOSIS — K449 Diaphragmatic hernia without obstruction or gangrene: Secondary | ICD-10-CM | POA: Diagnosis present

## 2014-12-09 DIAGNOSIS — Z882 Allergy status to sulfonamides status: Secondary | ICD-10-CM

## 2014-12-09 DIAGNOSIS — I6602 Occlusion and stenosis of left middle cerebral artery: Secondary | ICD-10-CM | POA: Diagnosis not present

## 2014-12-09 DIAGNOSIS — R4689 Other symptoms and signs involving appearance and behavior: Secondary | ICD-10-CM

## 2014-12-09 DIAGNOSIS — I714 Abdominal aortic aneurysm, without rupture: Secondary | ICD-10-CM | POA: Diagnosis not present

## 2014-12-09 DIAGNOSIS — Z4682 Encounter for fitting and adjustment of non-vascular catheter: Secondary | ICD-10-CM | POA: Diagnosis not present

## 2014-12-09 DIAGNOSIS — Z4659 Encounter for fitting and adjustment of other gastrointestinal appliance and device: Secondary | ICD-10-CM

## 2014-12-09 DIAGNOSIS — I97618 Postprocedural hemorrhage and hematoma of a circulatory system organ or structure following other circulatory system procedure: Secondary | ICD-10-CM | POA: Diagnosis not present

## 2014-12-09 DIAGNOSIS — J9600 Acute respiratory failure, unspecified whether with hypoxia or hypercapnia: Secondary | ICD-10-CM | POA: Diagnosis not present

## 2014-12-09 DIAGNOSIS — I611 Nontraumatic intracerebral hemorrhage in hemisphere, cortical: Secondary | ICD-10-CM | POA: Diagnosis not present

## 2014-12-09 DIAGNOSIS — I63532 Cerebral infarction due to unspecified occlusion or stenosis of left posterior cerebral artery: Secondary | ICD-10-CM | POA: Diagnosis not present

## 2014-12-09 DIAGNOSIS — R4182 Altered mental status, unspecified: Secondary | ICD-10-CM

## 2014-12-09 DIAGNOSIS — I4891 Unspecified atrial fibrillation: Secondary | ICD-10-CM | POA: Diagnosis not present

## 2014-12-09 DIAGNOSIS — Z8711 Personal history of peptic ulcer disease: Secondary | ICD-10-CM | POA: Diagnosis not present

## 2014-12-09 DIAGNOSIS — D649 Anemia, unspecified: Secondary | ICD-10-CM | POA: Diagnosis present

## 2014-12-09 DIAGNOSIS — I6529 Occlusion and stenosis of unspecified carotid artery: Secondary | ICD-10-CM | POA: Diagnosis not present

## 2014-12-09 DIAGNOSIS — M549 Dorsalgia, unspecified: Secondary | ICD-10-CM | POA: Diagnosis not present

## 2014-12-09 DIAGNOSIS — H9190 Unspecified hearing loss, unspecified ear: Secondary | ICD-10-CM | POA: Diagnosis present

## 2014-12-09 DIAGNOSIS — Z823 Family history of stroke: Secondary | ICD-10-CM | POA: Diagnosis not present

## 2014-12-09 DIAGNOSIS — I63512 Cerebral infarction due to unspecified occlusion or stenosis of left middle cerebral artery: Secondary | ICD-10-CM | POA: Diagnosis not present

## 2014-12-09 DIAGNOSIS — N400 Enlarged prostate without lower urinary tract symptoms: Secondary | ICD-10-CM | POA: Diagnosis present

## 2014-12-09 DIAGNOSIS — J90 Pleural effusion, not elsewhere classified: Secondary | ICD-10-CM | POA: Diagnosis not present

## 2014-12-09 DIAGNOSIS — Z515 Encounter for palliative care: Secondary | ICD-10-CM | POA: Diagnosis not present

## 2014-12-09 DIAGNOSIS — I82409 Acute embolism and thrombosis of unspecified deep veins of unspecified lower extremity: Secondary | ICD-10-CM | POA: Diagnosis present

## 2014-12-09 DIAGNOSIS — Z950 Presence of cardiac pacemaker: Secondary | ICD-10-CM

## 2014-12-09 DIAGNOSIS — I639 Cerebral infarction, unspecified: Secondary | ICD-10-CM

## 2014-12-09 DIAGNOSIS — Q251 Coarctation of aorta: Secondary | ICD-10-CM

## 2014-12-09 DIAGNOSIS — M353 Polymyalgia rheumatica: Secondary | ICD-10-CM | POA: Diagnosis present

## 2014-12-09 DIAGNOSIS — Z87891 Personal history of nicotine dependence: Secondary | ICD-10-CM | POA: Diagnosis not present

## 2014-12-09 DIAGNOSIS — Z66 Do not resuscitate: Secondary | ICD-10-CM | POA: Diagnosis not present

## 2014-12-09 DIAGNOSIS — E1151 Type 2 diabetes mellitus with diabetic peripheral angiopathy without gangrene: Secondary | ICD-10-CM | POA: Diagnosis present

## 2014-12-09 DIAGNOSIS — I5032 Chronic diastolic (congestive) heart failure: Secondary | ICD-10-CM | POA: Diagnosis present

## 2014-12-09 DIAGNOSIS — I6789 Other cerebrovascular disease: Secondary | ICD-10-CM | POA: Diagnosis not present

## 2014-12-09 DIAGNOSIS — Z9289 Personal history of other medical treatment: Secondary | ICD-10-CM

## 2014-12-09 DIAGNOSIS — Z91018 Allergy to other foods: Secondary | ICD-10-CM

## 2014-12-09 DIAGNOSIS — G4733 Obstructive sleep apnea (adult) (pediatric): Secondary | ICD-10-CM | POA: Diagnosis present

## 2014-12-09 DIAGNOSIS — I6350 Cerebral infarction due to unspecified occlusion or stenosis of unspecified cerebral artery: Secondary | ICD-10-CM | POA: Diagnosis not present

## 2014-12-09 DIAGNOSIS — G8191 Hemiplegia, unspecified affecting right dominant side: Secondary | ICD-10-CM | POA: Diagnosis present

## 2014-12-09 DIAGNOSIS — I663 Occlusion and stenosis of cerebellar arteries: Secondary | ICD-10-CM | POA: Diagnosis not present

## 2014-12-09 DIAGNOSIS — I619 Nontraumatic intracerebral hemorrhage, unspecified: Secondary | ICD-10-CM | POA: Diagnosis not present

## 2014-12-09 DIAGNOSIS — I517 Cardiomegaly: Secondary | ICD-10-CM | POA: Diagnosis not present

## 2014-12-09 DIAGNOSIS — R918 Other nonspecific abnormal finding of lung field: Secondary | ICD-10-CM | POA: Diagnosis not present

## 2014-12-09 DIAGNOSIS — I10 Essential (primary) hypertension: Secondary | ICD-10-CM | POA: Diagnosis not present

## 2014-12-09 DIAGNOSIS — I618 Other nontraumatic intracerebral hemorrhage: Secondary | ICD-10-CM | POA: Diagnosis not present

## 2014-12-09 HISTORY — PX: RADIOLOGY WITH ANESTHESIA: SHX6223

## 2014-12-09 LAB — COMPREHENSIVE METABOLIC PANEL
ALT: 69 U/L — AB (ref 17–63)
AST: 60 U/L — ABNORMAL HIGH (ref 15–41)
Albumin: 3.5 g/dL (ref 3.5–5.0)
Alkaline Phosphatase: 207 U/L — ABNORMAL HIGH (ref 38–126)
Anion gap: 11 (ref 5–15)
BILIRUBIN TOTAL: 0.9 mg/dL (ref 0.3–1.2)
BUN: 26 mg/dL — ABNORMAL HIGH (ref 6–20)
CO2: 27 mmol/L (ref 22–32)
CREATININE: 1.23 mg/dL (ref 0.61–1.24)
Calcium: 9.3 mg/dL (ref 8.9–10.3)
Chloride: 100 mmol/L — ABNORMAL LOW (ref 101–111)
GFR, EST NON AFRICAN AMERICAN: 52 mL/min — AB (ref >60)
Glucose, Bld: 244 mg/dL — ABNORMAL HIGH (ref 65–99)
POTASSIUM: 4.4 mmol/L (ref 3.5–5.1)
SODIUM: 138 mmol/L (ref 135–145)
TOTAL PROTEIN: 6.7 g/dL (ref 6.5–8.1)

## 2014-12-09 LAB — DIFFERENTIAL
BASOS ABS: 0 10*3/uL (ref 0.0–0.1)
Basophils Relative: 0 % (ref 0–1)
EOS ABS: 0 10*3/uL (ref 0.0–0.7)
Eosinophils Relative: 0 % (ref 0–5)
LYMPHS ABS: 1.5 10*3/uL (ref 0.7–4.0)
Lymphocytes Relative: 18 % (ref 12–46)
MONO ABS: 0.9 10*3/uL (ref 0.1–1.0)
Monocytes Relative: 11 % (ref 3–12)
Neutro Abs: 5.6 10*3/uL (ref 1.7–7.7)
Neutrophils Relative %: 70 % (ref 43–77)

## 2014-12-09 LAB — BLOOD GAS, ARTERIAL
Acid-Base Excess: 1 mmol/L (ref 0.0–2.0)
Bicarbonate: 25.6 mEq/L — ABNORMAL HIGH (ref 20.0–24.0)
Drawn by: 270221
FIO2: 0.5 %
MECHVT: 650 mL
O2 Saturation: 99.2 %
PEEP: 5 cmH2O
Patient temperature: 98.6
RATE: 16 resp/min
TCO2: 27 mmol/L (ref 0–100)
pCO2 arterial: 44.4 mmHg (ref 35.0–45.0)
pH, Arterial: 7.379 (ref 7.350–7.450)
pO2, Arterial: 214 mmHg — ABNORMAL HIGH (ref 80.0–100.0)

## 2014-12-09 LAB — MRSA PCR SCREENING: MRSA by PCR: NEGATIVE

## 2014-12-09 LAB — CBC
HCT: 36.4 % — ABNORMAL LOW (ref 39.0–52.0)
HEMOGLOBIN: 12.6 g/dL — AB (ref 13.0–17.0)
MCH: 33.1 pg (ref 26.0–34.0)
MCHC: 34.6 g/dL (ref 30.0–36.0)
MCV: 95.5 fL (ref 78.0–100.0)
Platelets: 179 10*3/uL (ref 150–400)
RBC: 3.81 MIL/uL — ABNORMAL LOW (ref 4.22–5.81)
RDW: 14.7 % (ref 11.5–15.5)
WBC: 8 10*3/uL (ref 4.0–10.5)

## 2014-12-09 LAB — I-STAT CHEM 8, ED
BUN: 28 mg/dL — ABNORMAL HIGH (ref 6–20)
CALCIUM ION: 1.15 mmol/L (ref 1.13–1.30)
Chloride: 101 mmol/L (ref 101–111)
Creatinine, Ser: 1.1 mg/dL (ref 0.61–1.24)
Glucose, Bld: 244 mg/dL — ABNORMAL HIGH (ref 65–99)
HCT: 40 % (ref 39.0–52.0)
Hemoglobin: 13.6 g/dL (ref 13.0–17.0)
Potassium: 4.3 mmol/L (ref 3.5–5.1)
SODIUM: 137 mmol/L (ref 135–145)
TCO2: 24 mmol/L (ref 0–100)

## 2014-12-09 LAB — GLUCOSE, CAPILLARY
GLUCOSE-CAPILLARY: 151 mg/dL — AB (ref 65–99)
Glucose-Capillary: 163 mg/dL — ABNORMAL HIGH (ref 65–99)

## 2014-12-09 LAB — PROTIME-INR
INR: 1.29 (ref 0.00–1.49)
Prothrombin Time: 16.2 seconds — ABNORMAL HIGH (ref 11.6–15.2)

## 2014-12-09 LAB — I-STAT TROPONIN, ED: Troponin i, poc: 0.01 ng/mL (ref 0.00–0.08)

## 2014-12-09 LAB — APTT: aPTT: 38 seconds — ABNORMAL HIGH (ref 24–37)

## 2014-12-09 SURGERY — RADIOLOGY WITH ANESTHESIA
Anesthesia: General

## 2014-12-09 MED ORDER — PANTOPRAZOLE SODIUM 40 MG IV SOLR
40.0000 mg | Freq: Every day | INTRAVENOUS | Status: DC
  Administered 2014-12-09 – 2014-12-13 (×5): 40 mg via INTRAVENOUS
  Filled 2014-12-09 (×5): qty 40

## 2014-12-09 MED ORDER — ACETAMINOPHEN 325 MG PO TABS: 650.0000 mg | ORAL_TABLET | ORAL | Status: DC | PRN

## 2014-12-09 MED ORDER — ALTEPLASE 30 MG/30 ML FOR INTERV. RAD
1.0000 mg | INTRA_ARTERIAL | Status: AC | PRN
  Filled 2014-12-09: qty 30

## 2014-12-09 MED ORDER — SODIUM CHLORIDE 0.9 % IV SOLN
INTRAVENOUS | Status: DC | PRN
  Administered 2014-12-09: 16:00:00 via INTRAVENOUS

## 2014-12-09 MED ORDER — NITROGLYCERIN 1 MG/10 ML FOR IR/CATH LAB
INTRA_ARTERIAL | Status: AC
  Filled 2014-12-09: qty 10

## 2014-12-09 MED ORDER — ROCURONIUM BROMIDE 100 MG/10ML IV SOLN
INTRAVENOUS | Status: DC | PRN
  Administered 2014-12-09 (×2): 50 mg via INTRAVENOUS

## 2014-12-09 MED ORDER — MIDAZOLAM HCL 5 MG/5ML IJ SOLN: INTRAMUSCULAR | Status: DC | PRN

## 2014-12-09 MED ORDER — ALTEPLASE 30 MG/30 ML FOR INTERV. RAD
INTRA_ARTERIAL | Status: AC | PRN
  Administered 2014-12-09: 4.6 mg via INTRA_ARTERIAL
  Administered 2014-12-09: 3 mg via INTRA_ARTERIAL

## 2014-12-09 MED ORDER — SUCCINYLCHOLINE CHLORIDE 20 MG/ML IJ SOLN
INTRAMUSCULAR | Status: DC | PRN
  Administered 2014-12-09: 100 mg via INTRAVENOUS

## 2014-12-09 MED ORDER — FENTANYL CITRATE (PF) 100 MCG/2ML IJ SOLN
INTRAMUSCULAR | Status: AC
  Filled 2014-12-09: qty 4

## 2014-12-09 MED ORDER — PHENYLEPHRINE HCL 10 MG/ML IJ SOLN
INTRAMUSCULAR | Status: DC | PRN
  Administered 2014-12-09: 80 ug via INTRAVENOUS

## 2014-12-09 MED ORDER — PROPOFOL 1000 MG/100ML IV EMUL
0.0000 ug/kg/min | INTRAVENOUS | Status: DC
Start: 2014-12-09 — End: 2014-12-14
  Administered 2014-12-10 – 2014-12-11 (×4): 30 ug/kg/min via INTRAVENOUS
  Administered 2014-12-11 (×2): 20 ug/kg/min via INTRAVENOUS
  Administered 2014-12-11 – 2014-12-12 (×2): 30 ug/kg/min via INTRAVENOUS
  Administered 2014-12-12: 20 ug/kg/min via INTRAVENOUS
  Administered 2014-12-12 (×2): 30 ug/kg/min via INTRAVENOUS
  Administered 2014-12-13: 25 ug/kg/min via INTRAVENOUS
  Administered 2014-12-13: 30 ug/kg/min via INTRAVENOUS
  Administered 2014-12-13: 15 ug/kg/min via INTRAVENOUS
  Administered 2014-12-14: 40 ug/kg/min via INTRAVENOUS
  Filled 2014-12-09 (×18): qty 100

## 2014-12-09 MED ORDER — PHENYLEPHRINE HCL 10 MG/ML IJ SOLN
10.0000 mg | INTRAVENOUS | Status: DC | PRN
  Administered 2014-12-09: 10 ug/min via INTRAVENOUS

## 2014-12-09 MED ORDER — PROPOFOL INFUSION 10 MG/ML OPTIME
INTRAVENOUS | Status: DC | PRN
  Administered 2014-12-09: 30 ug/kg/min via INTRAVENOUS

## 2014-12-09 MED ORDER — LIDOCAINE HCL (CARDIAC) 20 MG/ML IV SOLN
INTRAVENOUS | Status: DC | PRN
  Administered 2014-12-09: 60 mg via INTRAVENOUS

## 2014-12-09 MED ORDER — MIDAZOLAM HCL 2 MG/2ML IJ SOLN
INTRAMUSCULAR | Status: AC
  Administered 2014-12-09: 2 mg via INTRAVENOUS
  Filled 2014-12-09: qty 2

## 2014-12-09 MED ORDER — FENTANYL CITRATE (PF) 100 MCG/2ML IJ SOLN
INTRAMUSCULAR | Status: AC
  Filled 2014-12-09: qty 2

## 2014-12-09 MED ORDER — FENTANYL CITRATE (PF) 100 MCG/2ML IJ SOLN: 50.0000 ug | INTRAMUSCULAR | Status: DC | PRN

## 2014-12-09 MED ORDER — ACETAMINOPHEN 650 MG RE SUPP: 650.0000 mg | Freq: Four times a day (QID) | RECTAL | Status: DC | PRN

## 2014-12-09 MED ORDER — SODIUM CHLORIDE 0.9 % IV SOLN
INTRAVENOUS | Status: DC
  Administered 2014-12-09: 18:00:00 via INTRAVENOUS

## 2014-12-09 MED ORDER — ACETAMINOPHEN 500 MG PO TABS: 1000.0000 mg | ORAL_TABLET | Freq: Four times a day (QID) | ORAL | Status: DC | PRN

## 2014-12-09 MED ORDER — INSULIN ASPART 100 UNIT/ML ~~LOC~~ SOLN
0.0000 [IU] | SUBCUTANEOUS | Status: DC
  Administered 2014-12-09 – 2014-12-10 (×2): 2 [IU] via SUBCUTANEOUS
  Administered 2014-12-10: 3 [IU] via SUBCUTANEOUS
  Administered 2014-12-10 (×2): 2 [IU] via SUBCUTANEOUS
  Administered 2014-12-10 (×2): 3 [IU] via SUBCUTANEOUS
  Administered 2014-12-11 (×2): 5 [IU] via SUBCUTANEOUS
  Administered 2014-12-11 (×2): 3 [IU] via SUBCUTANEOUS
  Administered 2014-12-11: 2 [IU] via SUBCUTANEOUS
  Administered 2014-12-12: 5 [IU] via SUBCUTANEOUS

## 2014-12-09 MED ORDER — BISACODYL 10 MG RE SUPP: 10.0000 mg | Freq: Every day | RECTAL | Status: DC | PRN

## 2014-12-09 MED ORDER — STROKE: EARLY STAGES OF RECOVERY BOOK
Freq: Once | Status: DC
  Filled 2014-12-09: qty 1

## 2014-12-09 MED ORDER — CETYLPYRIDINIUM CHLORIDE 0.05 % MT LIQD
7.0000 mL | Freq: Four times a day (QID) | OROMUCOSAL | Status: DC
  Administered 2014-12-10 – 2014-12-13 (×16): 7 mL via OROMUCOSAL

## 2014-12-09 MED ORDER — SENNOSIDES-DOCUSATE SODIUM 8.6-50 MG PO TABS
1.0000 | ORAL_TABLET | Freq: Every evening | ORAL | Status: DC | PRN
  Filled 2014-12-09: qty 1

## 2014-12-09 MED ORDER — ONDANSETRON HCL 4 MG/2ML IJ SOLN: 4.0000 mg | Freq: Four times a day (QID) | INTRAMUSCULAR | Status: DC | PRN

## 2014-12-09 MED ORDER — IOHEXOL 300 MG/ML  SOLN
150.0000 mL | Freq: Once | INTRAMUSCULAR | Status: AC | PRN
  Administered 2014-12-09: 80 mL via INTRAVENOUS

## 2014-12-09 MED ORDER — PROPOFOL 10 MG/ML IV BOLUS
INTRAVENOUS | Status: DC | PRN
  Administered 2014-12-09: 70 mg via INTRAVENOUS

## 2014-12-09 MED ORDER — LABETALOL HCL 5 MG/ML IV SOLN
10.0000 mg | INTRAVENOUS | Status: DC | PRN
  Administered 2014-12-09 – 2014-12-12 (×2): 10 mg via INTRAVENOUS
  Filled 2014-12-09 (×2): qty 4

## 2014-12-09 MED ORDER — CHLORHEXIDINE GLUCONATE 0.12 % MT SOLN
15.0000 mL | Freq: Two times a day (BID) | OROMUCOSAL | Status: DC
  Administered 2014-12-09 – 2014-12-14 (×10): 15 mL via OROMUCOSAL
  Filled 2014-12-09 (×10): qty 15

## 2014-12-09 MED ORDER — FENTANYL CITRATE (PF) 100 MCG/2ML IJ SOLN
50.0000 ug | INTRAMUSCULAR | Status: DC | PRN
  Administered 2014-12-09 – 2014-12-13 (×6): 50 ug via INTRAVENOUS
  Filled 2014-12-09 (×6): qty 2

## 2014-12-09 MED ORDER — FENTANYL CITRATE (PF) 100 MCG/2ML IJ SOLN
INTRAMUSCULAR | Status: DC | PRN
  Administered 2014-12-09 (×3): 100 ug via INTRAVENOUS

## 2014-12-09 MED ORDER — LIDOCAINE HCL 1 % IJ SOLN
INTRAMUSCULAR | Status: AC
  Filled 2014-12-09: qty 20

## 2014-12-09 MED ORDER — ACETAMINOPHEN 650 MG RE SUPP: 650.0000 mg | RECTAL | Status: DC | PRN

## 2014-12-09 MED ORDER — SODIUM CHLORIDE 0.9 % IV SOLN
INTRAVENOUS | Status: DC
  Administered 2014-12-10 – 2014-12-11 (×2): via INTRAVENOUS

## 2014-12-09 MED ORDER — CEFAZOLIN SODIUM-DEXTROSE 2-3 GM-% IV SOLR
INTRAVENOUS | Status: AC
  Administered 2014-12-09: 2 g via INTRAVENOUS
  Filled 2014-12-09: qty 50

## 2014-12-09 NOTE — Progress Notes (Signed)
Code stroke called at 1413, patient arrived to [facility] ED via [facility] at 1418.  As per EMS, Patient was with his wife eating lunch when he suddenly developed right side weakness and aphasia.  LSN 1300, NIHSS 22, Patient takes Lovenox last time this am.  IR has been called in

## 2014-12-09 NOTE — Procedures (Signed)
S/P lt common carotid arteriogram. RT CFA approach. Findings . 1occluded prominent angular branch of LT MCA andfrontal branch of superior division. Complete revascularization of angular branch ,and partial revascularization of the frontal branch with 7.5 mg of superselective IATPA

## 2014-12-09 NOTE — Anesthesia Procedure Notes (Signed)
Procedure Name: Intubation Date/Time: 12/01/2014 4:00 PM Performed by: Merdis Delay Pre-anesthesia Checklist: Patient identified, Emergency Drugs available, Suction available, Patient being monitored and Timeout performed Patient Re-evaluated:Patient Re-evaluated prior to inductionOxygen Delivery Method: Circle system utilized Preoxygenation: Pre-oxygenation with 100% oxygen Intubation Type: IV induction Laryngoscope Size: Mac and 4 Grade View: Grade I Tube type: Subglottic suction tube Tube size: 8.0 mm Number of attempts: 1 Airway Equipment and Method: Stylet Placement Confirmation: ETT inserted through vocal cords under direct vision,  CO2 detector,  breath sounds checked- equal and bilateral and positive ETCO2 Secured at: 22 cm Tube secured with: Tape Dental Injury: Teeth and Oropharynx as per pre-operative assessment

## 2014-12-09 NOTE — Anesthesia Preprocedure Evaluation (Addendum)
Anesthesia Evaluation  Patient identified by MRN, date of birth, ID band Patient awake    Reviewed: Allergy & Precautions, H&P , NPO status , Patient's Chart, lab work & pertinent test results, reviewed documented beta blocker date and time Preop documentation limited or incomplete due to emergent nature of procedure.  Airway Mallampati: II  TM Distance: >3 FB Neck ROM: Full    Dental no notable dental hx. (+) Teeth Intact, Dental Advisory Given   Pulmonary asthma , sleep apnea , former smoker,  breath sounds clear to auscultation  Pulmonary exam normal       Cardiovascular hypertension, Pt. on medications and On Home Beta Blockers + CAD, + Past MI, + Peripheral Vascular Disease and +CHF + dysrhythmias Atrial Fibrillation + pacemaker + Valvular Problems/Murmurs AS Rhythm:Regular Rate:Normal     Neuro/Psych  Headaches, Seizures -,  TIACVA negative psych ROS   GI/Hepatic Neg liver ROS, PUD, GERD-  Medicated and Controlled,  Endo/Other  diabetes, Insulin Dependent  Renal/GU negative Renal ROS  negative genitourinary   Musculoskeletal  (+) Arthritis -, Osteoarthritis,    Abdominal   Peds  Hematology negative hematology ROS (+)   Anesthesia Other Findings   Reproductive/Obstetrics negative OB ROS                           Anesthesia Physical Anesthesia Plan  ASA: IV and emergent  Anesthesia Plan: General   Post-op Pain Management:    Induction: Intravenous, Rapid sequence and Cricoid pressure planned  Airway Management Planned: Oral ETT  Additional Equipment: Arterial line  Intra-op Plan:   Post-operative Plan: Post-operative intubation/ventilation  Informed Consent: I have reviewed the patients History and Physical, chart, labs and discussed the procedure including the risks, benefits and alternatives for the proposed anesthesia with the patient or authorized representative who has  indicated his/her understanding and acceptance.   Dental advisory given  Plan Discussed with: CRNA, Anesthesiologist and Surgeon  Anesthesia Plan Comments:        Anesthesia Quick Evaluation

## 2014-12-09 NOTE — Consult Note (Addendum)
Referring Physician: ED    Chief Complaint: sudden onset aphasia with right hemiplegia  HPI:                                                                                                                                         Paul Dalton is an 81 y.o. male with multiple medical including HTN, hyperlipidemia, atrial fibrillation off anticoagulation, CAD, s/p pacemaker placement, TIA's, AAA, DVT on fullk dose Lovenox, OSA, brought in via EMS for evaluation of the above stated symptoms. Wife is at the bedside. He was home today, ate lunch, and subsequently developed sudden onset of inability to speak and right sided weakness. EMS was summoned and patient brought to the ED where he was alert and awake with NIHSS 22. CT brain was personally reviewed and showed no acute abnormality. Patient administered his dose of Lovenox this morning.  Date last known well: 11/26/2014 Time last known well: 1 pm tPA Given: no intravenous thrombolysis was given as patient received Lovenox few hours before presentation. Taken to the interventional suite for angiogram and probable endovascular intervention NIHSS: 22   Past Medical History  Diagnosis Date  . Angina   . Coronary artery disease   . Heart murmur   . Hypertension     hyperlipidemia  . Shortness of breath     with exertion  . Recurrent upper respiratory infection (URI)     statrted with cold symptoms 05/30/11- large amount clear drainage out of nose, with cough- non productive- states chest hurts from coughing so much.  ?fever last PM  . Sleep apnea     last study years ago- SEVERS PER PATIENT- does not wear mask  . Chronic kidney disease   . Renal lithiasis     states in both kidneys, hematuria  . H/O hiatal hernia   . Headache(784.0)     history of migraines  . Stroke     3 mini strokes-last one 1 1/2 yrs ago  . Cancer     skin cancer left ear  . Arthritis   . Neuromuscular disorder     polymyalgia rheumatic with giant cell arteritis  x 1 1/2- states no permanent eye damage  . Dysrhythmia     atrial fibrilliation,s/p PPM,bradycardia s/p PPM, hx tachy-brady syndrome. LAST OV WITH INTERROGATION ON CHART. Clearance Dr Lovena Le with ok to stop coumadin 5 days pre op on chart  . Myocardial infarction     states silent heart attack years ago, but no damage-- stress test 2009 on chart  . Hematuria   . Syncope and collapse 05/26/13    ER visit 05/26/13 due to syncope. Possibly due to doxazosin.  . Diabetes mellitus with neurological manifestations, uncontrolled   . Edema   . Impotence of organic origin     ED  . Peptic ulcer, unspecified site, unspecified as acute or chronic, without mention of hemorrhage or  perforation, with obstruction   . Pure hypercholesterolemia   . Chronic ischemic heart disease, unspecified   . Anemia, unspecified   . Asthmatic bronchitis   . AAA (abdominal aortic aneurysm)   . Carotid stenosis   . BPH (benign prostatic hyperplasia)   . Hemorrhoids   . LBBB (left bundle branch block)   . Eczema   . Actinic keratoses   . Nephrolithiasis   . Vitamin B12 deficiency   . Allergic rhinitis   . History of MI (myocardial infarction)   . Osteoarthritis   . Esophageal reflux   . CHF (congestive heart failure)   . History of TIA (transient ischemic attack)   . Polymyalgia rheumatica   . Low back pain   . Hard of hearing   . Anaphylactic reaction     Tree nut (WALNUT ONLY)  . Coronary artery ectasia   . Carotid bruit   . Stable angina   . Dyspnea   . At high risk for falls   . Syncope   . Back pain   . Peripheral neuropathic pain     Past Surgical History  Procedure Laterality Date  . Insert / replace / remove pacemaker      [employer] Jude- last interrogation 7/12- followed by Dr Lovena Le  . Cardiac catheterization      x 2 -years ago  . Hemorrhoid surgery    . Eye surgery      bilateral cataract extraction with IOL  . Appendectomy    . Hernia repair      bilateral inguinal  . Rotator cuff  repair      right  . Doppler echocardiography  2009  . Ureteroscopy  06/24/2011    Procedure: URETEROSCOPY;  Surgeon: Molli Hazard, MD;  Location: [facility];  Service: Urology;  Laterality: Left;  . Cystoscopy w/ retrogrades  06/24/2011    Procedure: CYSTOSCOPY WITH RETROGRADE PYELOGRAM;  Surgeon: Molli Hazard, MD;  Location: [facility];  Service: Urology;  Laterality: Left;  . Cardioversion  03/1998  . Left and right heart catheterization with coronary angiogram N/A 09/22/2013    Procedure: LEFT AND RIGHT HEART CATHETERIZATION WITH CORONARY ANGIOGRAM ;  Surgeon: Peter M Martinique, MD;  Location: [facility] CATH LAB;  Service: Cardiovascular;  Laterality: N/A;    Family History  Problem Relation Age of Onset  . Heart disease Mother   . CVA Father    Social History:  reports that he quit smoking about 27 years ago. His smoking use included Cigarettes, Pipe, and Cigars. He quit after 30 years of use. He has never used smokeless tobacco. He reports that he does not drink alcohol or use illicit drugs.  Allergies:  Allergies  Allergen Reactions  . Peanut-Containing Drug Products Anaphylaxis and Other (See Comments)    ONLY WALNUTS  . Nisoldipine Hives and Nausea And Vomiting  . Procardia [Nifedipine] Other (See Comments)    Headaches  . Sular [Nisoldipine Er] Hives and Nausea And Vomiting  . Viagra [Sildenafil Citrate] Other (See Comments)    Headaches  . Isosorbide Mononitrate Rash  . Sulfa Antibiotics Rash  . Vioxx [Rofecoxib] Palpitations  . Zithromax [Azithromycin] Rash    Medications:  I have reviewed the patient's current medications.  ROS: unable to obtain due to mental status and aphasia                                                                                                                                      History obtained from chart review and  wife.   Physical exam: acutely ill male in no apparent distress. Blood pressure 140/75, pulse 70, temperature 97.6 F (36.4 C), temperature source Axillary, resp. rate 26, height $RemoveBe'6\' 4"'jvkaoWoNK$  (1.93 m), weight 106.601 kg (235 lb 0.2 oz), SpO2 99 %. Head: normocephalic. Neck: supple, no bruits, no JVD. Cardiac: no murmurs. Lungs: clear. Abdomen: soft, no tender, no mass. Extremities: no edema. Skin: no rash  Neurologic Examination:                                                                                                      General: Mental Status: Alert and awake.  Globally aphasic.   Cranial Nerves: II: Discs flat bilaterally; Visual fields grossly normal, pupils equal, round, reactive to light and accommodation III,IV, VI: ptosis not present, left gaze preference V,VII: smile symmetric, facial light touch sensation can not be reliably tested VIII: hearing normal bilaterally IX,X: uvula rises symmetrically XI: bilateral shoulder shrug no tested XII: midline tongue extension without atrophy or fasciculations Motor: Dense right hemiplegia Tone decreased in the right Sensory: Pinprick and light touch intact throughout, bilaterally Deep Tendon Reflexes:  1+ all over Plantars: Right: upgoing   Left: downgoing Cerebellar: Unable to test due to mental status Gait:  Unable to test due to mental status     Results for orders placed or performed during the hospital encounter of 12/13/2014 (from the past 48 hour(s))  Protime-INR     Status: Abnormal   Collection Time: 11/28/2014  2:25 PM  Result Value Ref Range   Prothrombin Time 16.2 (H) 11.6 - 15.2 seconds   INR 1.29 0.00 - 1.49  APTT     Status: Abnormal   Collection Time: 12/05/2014  2:25 PM  Result Value Ref Range   aPTT 38 (H) 24 - 37 seconds    Comment:        IF BASELINE aPTT IS ELEVATED, SUGGEST PATIENT RISK ASSESSMENT BE USED TO DETERMINE APPROPRIATE ANTICOAGULANT THERAPY.   CBC     Status: Abnormal   Collection Time:  12/13/2014  2:25 PM  Result Value Ref Range   WBC 8.0 4.0 - 10.5 K/uL   RBC 3.81 (  L) 4.22 - 5.81 MIL/uL   Hemoglobin 12.6 (L) 13.0 - 17.0 g/dL   HCT 87.0 (L) 97.1 - 88.9 %   MCV 95.5 78.0 - 100.0 fL   MCH 33.1 26.0 - 34.0 pg   MCHC 34.6 30.0 - 36.0 g/dL   RDW 14.3 50.0 - 03.8 %   Platelets 179 150 - 400 K/uL  Differential     Status: None   Collection Time: 11/16/2014  2:25 PM  Result Value Ref Range   Neutrophils Relative % 70 43 - 77 %   Neutro Abs 5.6 1.7 - 7.7 K/uL   Lymphocytes Relative 18 12 - 46 %   Lymphs Abs 1.5 0.7 - 4.0 K/uL   Monocytes Relative 11 3 - 12 %   Monocytes Absolute 0.9 0.1 - 1.0 K/uL   Eosinophils Relative 0 0 - 5 %   Eosinophils Absolute 0.0 0.0 - 0.7 K/uL   Basophils Relative 0 0 - 1 %   Basophils Absolute 0.0 0.0 - 0.1 K/uL  Comprehensive metabolic panel     Status: Abnormal   Collection Time: 12/11/2014  2:25 PM  Result Value Ref Range   Sodium 138 135 - 145 mmol/L   Potassium 4.4 3.5 - 5.1 mmol/L   Chloride 100 (L) 101 - 111 mmol/L   CO2 27 22 - 32 mmol/L   Glucose, Bld 244 (H) 65 - 99 mg/dL   BUN 26 (H) 6 - 20 mg/dL   Creatinine, Ser 1.90 0.61 - 1.24 mg/dL   Calcium 9.3 8.9 - 40.9 mg/dL   Total Protein 6.7 6.5 - 8.1 g/dL   Albumin 3.5 3.5 - 5.0 g/dL   AST 60 (H) 15 - 41 U/L   ALT 69 (H) 17 - 63 U/L   Alkaline Phosphatase 207 (H) 38 - 126 U/L   Total Bilirubin 0.9 0.3 - 1.2 mg/dL   GFR calc non Af Amer 52 (L) >60 mL/min   GFR calc Af Amer >60 >60 mL/min    Comment: (NOTE) The eGFR has been calculated using the CKD EPI equation. This calculation has not been validated in all clinical situations. eGFR's persistently <60 mL/min signify possible Chronic Kidney Disease.    Anion gap 11 5 - 15  I-Stat Chem 8, ED  (not at [facility], [facility])     Status: Abnormal   Collection Time: 11/18/2014  2:36 PM  Result Value Ref Range   Sodium 137 135 - 145 mmol/L   Potassium 4.3 3.5 - 5.1 mmol/L   Chloride 101 101 - 111 mmol/L   BUN 28 (H) 6 - 20 mg/dL    Creatinine, Ser 0.07 0.61 - 1.24 mg/dL   Glucose, Bld 685 (H) 65 - 99 mg/dL   Calcium, Ion 9.34 7.18 - 1.30 mmol/L   TCO2 24 0 - 100 mmol/L   Hemoglobin 13.6 13.0 - 17.0 g/dL   HCT 90.9 83.8 - 61.7 %  I-stat troponin, ED (not at [facility], [facility])     Status: None   Collection Time: 11/20/2014  2:36 PM  Result Value Ref Range   Troponin i, poc 0.01 0.00 - 0.08 ng/mL   Comment 3            Comment: Due to the release kinetics of cTnI, a negative result within the first hours of the onset of symptoms does not rule out myocardial infarction with certainty. If myocardial infarction is still suspected, repeat the test at appropriate intervals.    Ct Head (brain) Wo Contrast  11/24/2014   CLINICAL DATA:  Code stroke.  Right-sided weakness.  EXAM: CT HEAD WITHOUT CONTRAST  TECHNIQUE: Contiguous axial images were obtained from the base of the skull through the vertex without intravenous contrast.  COMPARISON:  08/23/2014; 05/26/2013  FINDINGS: Similar findings of advanced atrophy with diffuse sulcal prominence. Scattered periventricular hypodensities compatible microvascular ischemic disease. No CT evidence of acute large territory infarct. No intraparenchymal or extra-axial mass or hemorrhage. Unchanged size and configuration of the ventricles and basilar cisterns. No midline shift. Intracranial atherosclerosis. Limited visualization the paranasal sinuses and mastoid air cells is normal. No air-fluid levels. Regional soft tissues appear normal. Post bilateral cataract surgery. No displaced calvarial fracture.  IMPRESSION: Similar findings of atrophy and microvascular ischemic disease without acute intracranial process.  Critical Value/emergent results were called by telephone at the time of interpretation on 11/17/2014 at 2:32 pm to Dr. Aram Beecham, who verbally acknowledged these results.   Electronically Signed   By: Sandi Mariscal M.D.   On: 11/19/2014 14:35    Assessment: 81 y.o. male with acute presentation of a  syndrome consistent with a large left MCA distribution stroke, most likely cerebral embolism in the context of known atrial fibrillation. NIHSS 22, CT brain without acute abnormality. Patient received his usual dose of Lovenox this morning and thus IV tpa can not be administered. Contacted interventional neuroradiologist in order to consider interventional treatment. Family updated and agreed with aggressive endovascular treatment. Admit to NICU. Complete stroke work up. Stroke team will follow up tomorrow. Critical care consult requested for assistance with vent management.  Stroke Risk Factors - age,  HTN, hyperlipidemia, atrial fibrillation, CAD, TIA  Plan: 1. HgbA1c, fasting lipid panel 2. CT head in am 3. Echocardiogram 4. Prophylactic therapy-as per protocol 5. Risk factor modification 6. Telemetry monitoring 7. Frequent neuro checks 8. NPO   Dorian Pod, MD [facility] [phone]  12/02/2014, 3:05 PM 9. PT/OT SLP

## 2014-12-09 NOTE — Progress Notes (Signed)
RT Note: Pt transported from CT to unit with no complications. RT will continue to monitor.

## 2014-12-09 NOTE — ED Notes (Signed)
Pt from home via [facility] with sudden onset right sided weakness and incomprehensible speech LSW 1300.  Pt was walking and talking, ate lunch normally prior to onset.  Pt cannot look to the right, follows some commands, is alert to staff and family speaking with him.  Pt dx with DVT, started on Lovenox.  Pt alert.

## 2014-12-09 NOTE — ED Provider Notes (Addendum)
CSN: [id number]     Arrival date & time 11/21/2014  1428 History   First MD Initiated Contact with Patient 11/27/2014 1419     Chief Complaint  Patient presents with  . Code Stroke    An emergency department physician performed an initial assessment on this suspected stroke patient at 1420. (Consider location/radiation/quality/duration/timing/severity/associated sxs/prior Treatment) The history is provided by the patient and the EMS personnel.   level V caveat due to aphasia Patient presents as a code stroke. Reportedly last normal at 1:00. Patient began to be flaccid on the right side and not speak. Has right-sided neglect. He was reportedly on Lovenox and Coumadin for DVT. Previous history of TIAs  Past Medical History  Diagnosis Date  . Angina   . Coronary artery disease   . Heart murmur   . Hypertension     hyperlipidemia  . Shortness of breath     with exertion  . Recurrent upper respiratory infection (URI)     statrted with cold symptoms 05/30/11- large amount clear drainage out of nose, with cough- non productive- states chest hurts from coughing so much.  ?fever last PM  . Sleep apnea     last study years ago- SEVERS PER PATIENT- does not wear mask  . Chronic kidney disease   . Renal lithiasis     states in both kidneys, hematuria  . H/O hiatal hernia   . Headache(784.0)     history of migraines  . Stroke     3 mini strokes-last one 1 1/2 yrs ago  . Cancer     skin cancer left ear  . Arthritis   . 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 Dysrhythmia     atrial fibrilliation,s/p PPM,bradycardia s/p PPM, hx tachy-brady syndrome. LAST OV WITH INTERROGATION ON CHART. Clearance Dr Lovena Le with ok to stop coumadin 5 days pre op on chart  . Myocardial infarction     states silent heart attack years ago, but no damage-- stress test 2009 on chart  . Hematuria   . Syncope and collapse 05/26/13    ER  visit 05/26/13 due to syncope. Possibly due to doxazosin.  . Diabetes mellitus with neurological manifestations, uncontrolled   . Edema   . Impotence of organic origin     ED  . Peptic ulcer, unspecified site, unspecified as acute or chronic, without mention of hemorrhage or perforation, with obstruction   . Pure hypercholesterolemia   . Chronic ischemic heart disease, unspecified   . Anemia, unspecified   . Asthmatic bronchitis   . AAA (abdominal aortic aneurysm)   . Carotid stenosis   . BPH (benign prostatic hyperplasia)   . Hemorrhoids   . LBBB (left bundle branch block)   . Eczema   . Actinic keratoses   . Nephrolithiasis   . Vitamin B12 deficiency   . Allergic rhinitis   . History of MI (myocardial infarction)   . Osteoarthritis   . Esophageal reflux   . CHF (congestive heart failure)   . History of TIA (transient ischemic attack)   . Polymyalgia rheumatica   . Low back pain   . Hard of hearing   . Anaphylactic reaction     Tree nut ([employer] ONLY)  . Coronary artery ectasia   . Carotid bruit   . Stable angina   . Dyspnea   . At high risk for falls   . Syncope   .  Back pain   . Peripheral neuropathic pain    Past Surgical History  Procedure Laterality Date  . Insert / replace / remove pacemaker      St Jude- last interrogation 7/12- followed by Dr Lovena Le  . Cardiac catheterization      x 2 -years ago  . Hemorrhoid surgery    . Eye surgery      bilateral cataract extraction with IOL  . Appendectomy    . Hernia repair      bilateral inguinal  . Rotator cuff repair      right  . Doppler echocardiography  2009  . Ureteroscopy  06/24/2011    Procedure: URETEROSCOPY;  Surgeon: Molli Hazard, MD;  Location: [facility];  Service: Urology;  Laterality: Left;  . Cystoscopy w/ retrogrades  06/24/2011    Procedure: CYSTOSCOPY WITH RETROGRADE PYELOGRAM;  Surgeon: Molli Hazard, MD;  Location: [facility];  Service: Urology;  Laterality: Left;  . Cardioversion   03/1998  . Left and right heart catheterization with coronary angiogram N/A 09/22/2013    Procedure: LEFT AND RIGHT HEART CATHETERIZATION WITH CORONARY ANGIOGRAM ;  Surgeon: Peter M Martinique, MD;  Location: [facility];  Service: Cardiovascular;  Laterality: N/A;   Family History  Problem Relation Age of Onset  . Heart disease Mother   . CVA Father    History  Substance Use Topics  . Smoking status: Former Smoker -- 30 years    Types: Cigarettes, Pipe, Cigars    Quit date: 06/01/1987  . Smokeless tobacco: Never Used  . Alcohol Use: No    Review of Systems  Unable to perform ROS     Allergies  Peanut-containing drug products; Nisoldipine; Procardia; Sular; Viagra; Isosorbide mononitrate; Sulfa antibiotics; Vioxx; and Zithromax  Home Medications   Prior to Admission medications   Medication Sig Start Date End Date Taking? Authorizing Provider  atorvastatin (LIPITOR) 40 MG tablet Take 20 mg by mouth at bedtime.    Yes Historical Provider, MD  cholecalciferol (VITAMIN D) 400 UNITS TABS Take 400 Units by mouth every evening.    Yes Historical Provider, MD  Cinnamon 500 MG capsule Take 500 mg by mouth 2 (two) times daily.    Yes Historical Provider, MD  digoxin (LANOXIN) 0.125 MG tablet Take 1 tablet (0.125 mg total) by mouth daily. Patient taking differently: Take 0.125 mg by mouth every morning.  10/31/13  Yes Evans Lance, MD  diltiazem (CARDIZEM CD) 180 MG 24 hr capsule Take 1 capsule (180 mg total) by mouth at bedtime. 10/31/13  Yes Evans Lance, MD  doxazosin (CARDURA) 4 MG tablet Take 2 mg by mouth at bedtime.    Yes Historical Provider, MD  finasteride (PROSCAR) 5 MG tablet Take 5 mg by mouth at bedtime.    Yes Historical Provider, MD  folic acid (FOLVITE) 213 MCG tablet Take 400 mcg by mouth every evening.    Yes Historical Provider, MD  furosemide (LASIX) 40 MG tablet Take 1 tablet (40 mg total) by mouth 2 (two) times daily. 10/31/13  Yes Evans Lance, MD   glucosamine-chondroitin 500-400 MG tablet Take 1 tablet by mouth 2 (two) times daily.    Yes Historical Provider, MD  HUMALOG KWIKPEN 100 UNIT/ML KiwkPen Inject 5 Units into the skin 2 (two) times daily before a meal.  10/18/14  Yes Historical Provider, MD  insulin glargine (LANTUS) 100 UNIT/ML injection Inject 0.45 mLs (45 Units total) into the skin daily. Patient taking differently: Inject 45  Units into the skin every morning.  08/25/14  Yes Geradine Girt, DO  metoprolol (LOPRESSOR) 100 MG tablet Take 100 mg by mouth 2 (two) times daily.    Yes Historical Provider, MD  Misc Natural Products (APPLE CIDER VINEGAR) TABS Take 1 tablet by mouth 2 (two) times daily.    Yes Historical Provider, MD  omeprazole (PRILOSEC) 20 MG capsule Take 20 mg by mouth 2 (two) times daily.    Yes Historical Provider, MD  oxyCODONE-acetaminophen (PERCOCET/ROXICET) 5-325 MG per tablet Take 1 tablet by mouth every 8 (eight) hours as needed for severe pain. 11/28/14  Yes Marcial Pacas, MD  potassium chloride SA (KLOR-CON M20) 20 MEQ tablet Take 1 tablet (20 mEq total) by mouth daily. Patient taking differently: Take 20 mEq by mouth every morning.  10/10/13  Yes Evans Lance, MD  sitaGLIPtin-metformin (JANUMET) 50-1000 MG per tablet Take 0.5-1 tablets by mouth 2 (two) times daily with a meal. Take one tablet in the morning and half of tablet in the evening   Yes Historical Provider, MD  traMADol-acetaminophen (ULTRACET) 37.5-325 MG per tablet Take 1 tablet by mouth every 6 (six) hours as needed for moderate pain or severe pain.   Yes Historical Provider, MD  vitamin B-12 (CYANOCOBALAMIN) 1000 MCG tablet Take 1,000 mcg by mouth 2 (two) times daily.    Yes Historical Provider, MD  warfarin (COUMADIN) 5 MG tablet Take 5 mg by mouth every morning.  09/27/13  Yes Historical Provider, MD  [place] test strip  08/02/13   Historical Provider, MD  enoxaparin (LOVENOX) 100 MG/ML injection  12/04/14   Historical Provider, MD   glucagon 1 MG injection Inject 1 mg into the vein once as needed. 08/25/14   Geradine Girt, DO  nitroGLYCERIN (NITROSTAT) 0.4 MG SL tablet Place 0.4 mg under the tongue every 5 (five) minutes as needed. CHEST PAIN     Historical Provider, MD  NON FORMULARY O2 AT NIGHT    Historical Provider, MD  spironolactone (ALDACTONE) 25 MG tablet  11/01/14   Historical Provider, MD   BP 140/75 mmHg  Pulse 70  Temp(Src) 97.6 F (36.4 C) (Axillary)  Resp 26  Ht 6\' 4"  (1.93 m)  Wt 235 lb 0.2 oz (106.601 kg)  BMI 28.62 kg/m2  SpO2 99% Physical Exam  Constitutional: He appears well-developed.  Eyes:  Patient eye-movement does not appear to cross midline to the right side  Cardiovascular: Normal rate and regular rhythm.   Pulmonary/Chest: Effort normal. No respiratory distress.  Abdominal: Soft. There is no tenderness.  Musculoskeletal: He exhibits no tenderness.  Neurological:  Patient is awake and will look to voice somewhat. Nonverbal. Flaccid on right. Does have some muscle strength on left side but will not clearly follow commands. Complete NIH scoring done by neurology.  Skin: Skin is warm.    ED Course  Procedures (including critical care time) Labs Review Labs Reviewed  PROTIME-INR - Abnormal; Notable for the following:    Prothrombin Time 16.2 (*)    All other components within normal limits  APTT - Abnormal; Notable for the following:    aPTT 38 (*)    All other components within normal limits  CBC - Abnormal; Notable for the following:    RBC 3.81 (*)    Hemoglobin 12.6 (*)    HCT 36.4 (*)    All other components within normal limits  COMPREHENSIVE METABOLIC PANEL - Abnormal; Notable for the following:    Chloride 100 (*)  Glucose, Bld 244 (*)    BUN 26 (*)    AST 60 (*)    ALT 69 (*)    Alkaline Phosphatase 207 (*)    GFR calc non Af Amer 52 (*)    All other components within normal limits  I-STAT CHEM 8, ED - Abnormal; Notable for the following:    BUN 28 (*)     Glucose, Bld 244 (*)    All other components within normal limits  DIFFERENTIAL  I-STAT TROPOININ, ED  CBG MONITORING, ED    Imaging Review Ct Head (brain) Wo Contrast  11/14/2014   CLINICAL DATA:  Code stroke.  Right-sided weakness.  EXAM: CT HEAD WITHOUT CONTRAST  TECHNIQUE: Contiguous axial images were obtained from the base of the skull through the vertex without intravenous contrast.  COMPARISON:  08/23/2014; 05/26/2013  FINDINGS: Similar findings of advanced atrophy with diffuse sulcal prominence. Scattered periventricular hypodensities compatible microvascular ischemic disease. No CT evidence of acute large territory infarct. No intraparenchymal or extra-axial mass or hemorrhage. Unchanged size and configuration of the ventricles and basilar cisterns. No midline shift. Intracranial atherosclerosis. Limited visualization the paranasal sinuses and mastoid air cells is normal. No air-fluid levels. Regional soft tissues appear normal. Post bilateral cataract surgery. No displaced calvarial fracture.  IMPRESSION: Similar findings of atrophy and microvascular ischemic disease without acute intracranial process.  Critical Value/emergent results were called by telephone at the time of interpretation on 12/10/2014 at 2:32 pm to Dr. Aram Beecham, who verbally acknowledged these results.   Electronically Signed   By: Sandi Mariscal M.D.   On: 11/25/2014 14:35     EKG Interpretation None     ED ECG REPORT   Date: 11/25/2014  Rate: 70  Rhythm: paced  QRS Axis: indeterminate  Intervals: paced  ST/T Wave abnormalities: normal  Conduction Disutrbances:none  Narrative Interpretation:   Old EKG Reviewed: none available    MDM   Final diagnoses:  Stroke with cerebral ischemia    Patient presents with apparent severe stroke. Has acidity in the right side is nonverbal. Right-sided neglect also. Likely large left-sided MCA stroke. Initial head CT negative. NIH score of 22. After initial review of  medicines patient is on Lovenox full dose but not Coumadin. Will be taken to interventional radiology    Davonna Belling, MD 11/21/2014 1523  CRITICAL CARE Performed by: Mackie Pai. Total critical care time:30 Critical care time was exclusive of separately billable procedures and treating other patients. Critical care was necessary to treat or prevent imminent or life-threatening deterioration. Critical care was time spent personally by me on the following activities: development of treatment plan with patient and/or surrogate as well as nursing, discussions with consultants, evaluation of patient's response to treatment, examination of patient, obtaining history from patient or surrogate, ordering and performing treatments and interventions, ordering and review of laboratory studies, ordering and review of radiographic studies, pulse oximetry and re-evaluation of patient's condition.  Davonna Belling, MD 12/07/2014 1524

## 2014-12-09 NOTE — Transfer of Care (Signed)
Immediate Anesthesia Transfer of Care Note  Patient: Paul Dalton  Procedure(s) Performed: Procedure(s): RADIOLOGY WITH ANESTHESIA (N/A)  Patient Location: NICU  Anesthesia Type:General  Level of Consciousness: sedated  Airway & Oxygen Therapy: Patient remains intubated per anesthesia plan and Patient placed on Ventilator (see vital sign flow sheet for setting)  Post-op Assessment: Report given to RN and Post -op Vital signs reviewed and stable  Post vital signs: Reviewed and stable  Last Vitals:  Filed Vitals:   12/02/2014 1520  BP: 134/74  Pulse: 70  Temp:   Resp: 18    Complications: No apparent anesthesia complications

## 2014-12-09 NOTE — Anesthesia Postprocedure Evaluation (Signed)
  Anesthesia Post-op Note  Patient: Paul Dalton  Procedure(s) Performed: Procedure(s): RADIOLOGY WITH ANESTHESIA (N/A)  Patient Location: ICU  Anesthesia Type:General  Level of Consciousness: sedated and unresponsive  Airway and Oxygen Therapy: Patient remains intubated and on ventilator  Post-op Pain: none  Post-op Assessment: Post-op Vital signs reviewed, Patient's Cardiovascular Status Stable and Respiratory Function Stable  Post-op Vital Signs: Reviewed  Filed Vitals:   12/08/2014 1520  BP: 134/74  Pulse: 70  Temp:   Resp: 18    Complications: No apparent anesthesia complications

## 2014-12-09 NOTE — H&P (Signed)
PULMONARY  / CRITICAL CARE MEDICINE CONSULTATION   Name: Paul Dalton MRN: [id number] DOB: [birth year]    ADMISSION DATE:  11/24/2014 CONSULTATION DATE: December 09, 2014  REQUESTING CLINICIAN: Garvin Fila, MD PRIMARY SERVICE: Neurology  CHIEF COMPLAINT:  Sudden onset aphasia  BRIEF PATIENT DESCRIPTION: 81 y/o man with multiple risk factors for stroke on Lovenox for treatment of DVT who presented with left MCA stroke s/p revascularization and catheter directed TPA.  SIGNIFICANT EVENTS / STUDIES:  11/18/2014 - underwent intervention of acute L MCA stroke with 7.5 mg of superselective catheter-directed tPA.  LINES / TUBES: 8.0 ETT - 6/26 R fem Art line - 6/26 L fem arterial sheath - 6/26 Foley - 6/26  CULTURES: none  ANTIBIOTICS: Ancef x1 6/26  HISTORY OF PRESENT ILLNESS:  Paul Dalton is a 81 year old man with a history of CAD, DM, prior TIA, and seizure in March of 2016. He was found to have a DVT within a week of his presentation and was started on lovenox, 100 mg BID. He was brought to the ED on 6/26 after developing sudden onset aphasia and R sided weakness. He had a NIHSS of 22 and a head CT did not demonstrate any hemorrhage. Due to his anticoagulated state, tPA was not given, but he was taken by IR for catheter intervention where he had an angular branch of his L MCA occluded, as well as frontal branch of a superior division of the same. He had complete revascularization of the angular branch, and partial revascularization of the frontal branch. He was brought to the ICU and remain intubated. PCCM was consulted for critical care management.  PAST MEDICAL HISTORY :  Past Medical History  Diagnosis Date  . Angina   . Coronary artery disease   . Heart murmur   . Hypertension     hyperlipidemia  . Shortness of breath     with exertion  . Recurrent upper respiratory infection (URI)     statrted with cold symptoms 05/30/11- large amount clear drainage out of nose, with cough-  non productive- states chest hurts from coughing so much.  ?fever last PM  . Sleep apnea     last study years ago- SEVERS PER PATIENT- does not wear mask  . Chronic kidney disease   . Renal lithiasis     states in both kidneys, hematuria  . H/O hiatal hernia   . Headache(784.0)     history of migraines  . Stroke     3 mini strokes-last one 1 1/2 yrs ago  . Cancer     skin cancer left ear  . Arthritis   . 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 Dysrhythmia     atrial fibrilliation,s/p PPM,bradycardia s/p PPM, hx tachy-brady syndrome. LAST OV WITH INTERROGATION ON CHART. Clearance Dr Lovena Le with ok to stop coumadin 5 days pre op on chart  . Myocardial infarction     states silent heart attack years ago, but no damage-- stress test 2009 on chart  . Hematuria   . Syncope and collapse 05/26/13    ER visit 05/26/13 due to syncope. Possibly due to doxazosin.  . Diabetes mellitus with neurological manifestations, uncontrolled   . Edema   . Impotence of organic origin     ED  . Peptic ulcer, unspecified site, unspecified as acute or chronic, without mention of hemorrhage or perforation, with obstruction   .  Pure hypercholesterolemia   . Chronic ischemic heart disease, unspecified   . Anemia, unspecified   . Asthmatic bronchitis   . AAA (abdominal aortic aneurysm)   . Carotid stenosis   . BPH (benign prostatic hyperplasia)   . Hemorrhoids   . LBBB (left bundle branch block)   . Eczema   . Actinic keratoses   . Nephrolithiasis   . Vitamin B12 deficiency   . Allergic rhinitis   . History of MI (myocardial infarction)   . Osteoarthritis   . Esophageal reflux   . CHF (congestive heart failure)   . History of TIA (transient ischemic attack)   . Polymyalgia rheumatica   . Low back pain   . Hard of hearing   . Anaphylactic reaction     Tree nut (WALNUT ONLY)  . Coronary artery ectasia   . Carotid bruit    . Stable angina   . Dyspnea   . At high risk for falls   . Syncope   . Back pain   . Peripheral neuropathic pain    Past Surgical History  Procedure Laterality Date  . Insert / replace / remove pacemaker      St Jude- last interrogation 7/12- followed by Dr Lovena Le  . Cardiac catheterization      x 2 -years ago  . Hemorrhoid surgery    . Eye surgery      bilateral cataract extraction with IOL  . Appendectomy    . Hernia repair      bilateral inguinal  . Rotator cuff repair      right  . Doppler echocardiography  2009  . Ureteroscopy  06/24/2011    Procedure: URETEROSCOPY;  Surgeon: Molli Hazard, MD;  Location: [facility];  Service: Urology;  Laterality: Left;  . Cystoscopy w/ retrogrades  06/24/2011    Procedure: CYSTOSCOPY WITH RETROGRADE PYELOGRAM;  Surgeon: Molli Hazard, MD;  Location: [facility];  Service: Urology;  Laterality: Left;  . Cardioversion  03/1998  . Left and right heart catheterization with coronary angiogram N/A 09/22/2013    Procedure: LEFT AND RIGHT HEART CATHETERIZATION WITH CORONARY ANGIOGRAM ;  Surgeon: Peter M Martinique, MD;  Location: [facility] CATH LAB;  Service: Cardiovascular;  Laterality: N/A;   Prior to Admission medications   Medication Sig Start Date End Date Taking? Authorizing Provider  atorvastatin (LIPITOR) 40 MG tablet Take 20 mg by mouth at bedtime.    Yes Historical Provider, MD  cholecalciferol (VITAMIN D) 400 UNITS TABS Take 400 Units by mouth every evening.    Yes Historical Provider, MD  Cinnamon 500 MG capsule Take 500 mg by mouth 2 (two) times daily.    Yes Historical Provider, MD  digoxin (LANOXIN) 0.125 MG tablet Take 1 tablet (0.125 mg total) by mouth daily. Patient taking differently: Take 0.125 mg by mouth every morning.  10/31/13  Yes Evans Lance, MD  diltiazem (CARDIZEM CD) 180 MG 24 hr capsule Take 1 capsule (180 mg total) by mouth at bedtime. 10/31/13  Yes Evans Lance, MD  doxazosin (CARDURA) 4 MG tablet Take 2 mg by mouth at  bedtime.    Yes Historical Provider, MD  enoxaparin (LOVENOX) 100 MG/ML injection Inject 100 mg into the skin every 12 (twelve) hours.  12/04/14  Yes Historical Provider, MD  finasteride (PROSCAR) 5 MG tablet Take 5 mg by mouth at bedtime.    Yes Historical Provider, MD  folic acid (FOLVITE) 568 MCG tablet Take 400 mcg by mouth every evening.  Yes Historical Provider, MD  furosemide (LASIX) 40 MG tablet Take 1 tablet (40 mg total) by mouth 2 (two) times daily. 10/31/13  Yes Evans Lance, MD  glucosamine-chondroitin 500-400 MG tablet Take 1 tablet by mouth 2 (two) times daily.    Yes Historical Provider, MD  HUMALOG KWIKPEN 100 UNIT/ML KiwkPen Inject 5 Units into the skin 2 (two) times daily before a meal.  10/18/14  Yes Historical Provider, MD  insulin glargine (LANTUS) 100 UNIT/ML injection Inject 0.45 mLs (45 Units total) into the skin daily. Patient taking differently: Inject 45 Units into the skin every morning.  08/25/14  Yes Geradine Girt, DO  metoprolol (LOPRESSOR) 100 MG tablet Take 100 mg by mouth 2 (two) times daily.    Yes Historical Provider, MD  Misc Natural Products (APPLE CIDER VINEGAR) TABS Take 1 tablet by mouth 2 (two) times daily.    Yes Historical Provider, MD  omeprazole (PRILOSEC) 20 MG capsule Take 20 mg by mouth 2 (two) times daily.    Yes Historical Provider, MD  oxyCODONE-acetaminophen (PERCOCET/ROXICET) 5-325 MG per tablet Take 1 tablet by mouth every 8 (eight) hours as needed for severe pain. 11/28/14  Yes Marcial Pacas, MD  potassium chloride SA (KLOR-CON M20) 20 MEQ tablet Take 1 tablet (20 mEq total) by mouth daily. Patient taking differently: Take 20 mEq by mouth every morning.  10/10/13  Yes Evans Lance, MD  sitaGLIPtin-metformin (JANUMET) 50-1000 MG per tablet Take 0.5-1 tablets by mouth 2 (two) times daily with a meal. Take one tablet in the morning and half of tablet in the evening   Yes Historical Provider, MD  spironolactone (ALDACTONE) 25 MG tablet Take 25 mg by  mouth every morning.  11/01/14  Yes Historical Provider, MD  traMADol-acetaminophen (ULTRACET) 37.5-325 MG per tablet Take 1 tablet by mouth every 6 (six) hours as needed for moderate pain or severe pain.   Yes Historical Provider, MD  vitamin B-12 (CYANOCOBALAMIN) 1000 MCG tablet Take 1,000 mcg by mouth 2 (two) times daily.    Yes Historical Provider, MD  ACCU-CHEK AVIVA PLUS test strip  08/02/13   Historical Provider, MD  glucagon 1 MG injection Inject 1 mg into the vein once as needed. 08/25/14   Geradine Girt, DO  nitroGLYCERIN (NITROSTAT) 0.4 MG SL tablet Place 0.4 mg under the tongue every 5 (five) minutes as needed. CHEST PAIN     Historical Provider, MD  NON FORMULARY O2 AT NIGHT    Historical Provider, MD   Allergies  Allergen Reactions  . Peanut-Containing Drug Products Anaphylaxis and Other (See Comments)    ONLY WALNUTS  . Nisoldipine Hives and Nausea And Vomiting  . Procardia [Nifedipine] Other (See Comments)    Headaches  . Sular [Nisoldipine Er] Hives and Nausea And Vomiting  . Viagra [Sildenafil Citrate] Other (See Comments)    Headaches  . Isosorbide Mononitrate Rash  . Sulfa Antibiotics Rash  . Vioxx [Rofecoxib] Palpitations  . Zithromax [Azithromycin] Rash    FAMILY HISTORY:  Family History  Problem Relation Age of Onset  . Heart disease Mother   . CVA Father    SOCIAL HISTORY:  reports that he quit smoking about 27 years ago. His smoking use included Cigarettes, Pipe, and Cigars. He quit after 30 years of use. He has never used smokeless tobacco. He reports that he does not drink alcohol or use illicit drugs.  REVIEW OF SYSTEMS:  Unable to assess.  SUBJECTIVE:   VITAL SIGNS: Temp:  [97.3  F (36.3 C)-97.6 F (36.4 C)] 97.3 F (36.3 C) (06/26 2000) Pulse Rate:  [67-77] 74 (06/26 2000) Resp:  [16-26] 20 (06/26 2000) BP: (114-140)/(57-75) 126/73 mmHg (06/26 2000) SpO2:  [98 %-100 %] 100 % (06/26 2000) Arterial Line BP: (101-146)/(41-74) 146/74 mmHg (06/26  2000) FiO2 (%):  [30 %-50 %] 30 % (06/26 2000) Weight:  [226 lb 10.1 oz (102.8 kg)-235 lb 0.2 oz (106.601 kg)] 226 lb 10.1 oz (102.8 kg) (06/26 1825) HEMODYNAMICS:   VENTILATOR SETTINGS: Vent Mode:  [-] PRVC FiO2 (%):  [30 %-50 %] 30 % Set Rate:  [16 bmp-20 bmp] 20 bmp Vt Set:  [550 mL-650 mL] 550 mL PEEP:  [5 cmH20] 5 cmH20 Plateau Pressure:  [14 cmH20-16 cmH20] 16 cmH20 INTAKE / OUTPUT: Intake/Output      06/26 0701 - 06/27 0700   I.V. (mL/kg) 774.9 (7.5)   Total Intake(mL/kg) 774.9 (7.5)   Urine (mL/kg/hr) 600   Blood 100   Total Output 700   Net +74.9         PHYSICAL EXAMINATION: General:  Elderly man, intubated Neuro:  Awake, able to answer yes/no appropriately HEENT:  MMM Neck: No masses Cardiovascular:  Heart sounds dual Lungs:  CTAB Abdomen:  Soft Musculoskeletal:  No deformities. Skin:  Scattered ecchymoses.  LABS:  CBC  Recent Labs Lab 11/28/2014 1425 11/22/2014 1436  WBC 8.0  --   HGB 12.6* 13.6  HCT 36.4* 40.0  PLT 179  --    Coag's  Recent Labs Lab 12/06/2014 1425  APTT 38*  INR 1.29   BMET  Recent Labs Lab 11/29/2014 1425 11/30/2014 1436  NA 138 137  K 4.4 4.3  CL 100* 101  CO2 27  --   BUN 26* 28*  CREATININE 1.23 1.10  GLUCOSE 244* 244*   Electrolytes  Recent Labs Lab 11/23/2014 1425  CALCIUM 9.3   Sepsis Markers No results for input(s): LATICACIDVEN, PROCALCITON, O2SATVEN in the last 168 hours. ABG  Recent Labs Lab 12/13/2014 1835  PHART 7.379  PCO2ART 44.4  PO2ART 214*   Liver Enzymes  Recent Labs Lab 11/25/2014 1425  AST 60*  ALT 69*  ALKPHOS 207*  BILITOT 0.9  ALBUMIN 3.5   Cardiac Enzymes No results for input(s): TROPONINI, PROBNP in the last 168 hours. Glucose  Recent Labs Lab 11/16/2014 1922  GLUCAP 151*    Imaging Ct Head Wo Contrast  11/27/2014   CLINICAL DATA:  Stroke.  Post neurointerventional revascularization.  EXAM: CT HEAD WITHOUT CONTRAST  TECHNIQUE: Contiguous axial images were obtained from  the base of the skull through the vertex without intravenous contrast.  COMPARISON:  Noncontrast head CT earlier this day at 1424 hr  FINDINGS: No interval hemorrhage. Contrast within the intra cerebral vasculature from recent procedure. There are a few scattered focal areas of hyperdensity throughout the left cerebral hemisphere, for example occipital lobe image 11, parietal lobe image 23/24. This may be related to venous stasis or intravascular calcification. No cerebral edema. There is diffuse mucosal thickening of the paranasal sinuses. The exam is otherwise unchanged.  IMPRESSION: 1. No postprocedural intracranial hemorrhage. 2. Scattered hypodense foci in the left cerebral hemisphere, may reflect intravascular contrast related to stasis versus calcifications, possibly embolic.   Electronically Signed   By: Jeb Levering M.D.   On: 11/22/2014 18:47   Ct Head (brain) Wo Contrast  11/20/2014   CLINICAL DATA:  Code stroke.  Right-sided weakness.  EXAM: CT HEAD WITHOUT CONTRAST  TECHNIQUE: Contiguous axial images were obtained  from the base of the skull through the vertex without intravenous contrast.  COMPARISON:  08/23/2014; 05/26/2013  FINDINGS: Similar findings of advanced atrophy with diffuse sulcal prominence. Scattered periventricular hypodensities compatible microvascular ischemic disease. No CT evidence of acute large territory infarct. No intraparenchymal or extra-axial mass or hemorrhage. Unchanged size and configuration of the ventricles and basilar cisterns. No midline shift. Intracranial atherosclerosis. Limited visualization the paranasal sinuses and mastoid air cells is normal. No air-fluid levels. Regional soft tissues appear normal. Post bilateral cataract surgery. No displaced calvarial fracture.  IMPRESSION: Similar findings of atrophy and microvascular ischemic disease without acute intracranial process.  Critical Value/emergent results were called by telephone at the time of  interpretation on 11/15/2014 at 2:32 pm to Dr. Aram Beecham, who verbally acknowledged these results.   Electronically Signed   By: Sandi Mariscal M.D.   On: 12/01/2014 14:35    EKG: V-paced rhythm. CXR: Pending  ASSESSMENT / PLAN:  PULMONARY A: Need for Mechanical Ventilation P:   Maintain on PRVC overnight, SBT in AM. VAP prevention bundle.  CARDIOVASCULAR A: Hypertension Diastolic HF P:   Will maintain goal SBP >120, <140. When agitated, is a little higher than this, a low-dose propofol may help control agitation / maintain BP. Will need secondary prevention medications prior to d/c  RENAL A: Mild renal insuffiency,  P:   Also go contrast today; will avoid further nephrotoxic drugs  GASTROINTESTINAL A: Holding TF 2/2 possible extubatation tomorrow. P:    HEMATOLOGIC A: Recent DVT P:   Hold lovenox for now given recent tPA and risk for secondary hemorrhage. Will discuss with neurology timing of restarting anti-coag and what agent may be most appropriate (reversible vs not)  INFECTIOUS A: No active issues. P:    ENDOCRINE A: DM 2 P:   Moderate SSI ordered  NEUROLOGIC A: Acute L MCA stroke P:   S/p intervention. Defer to primary team for additional management. CT in AM, maintain careful BP control o/n  TODAY'S SUMMARY: 81 y/o man with acute L MCA stroke s/p catheter intervention.  I have personally obtained a history, examined the patient, evaluated laboratory and imaging results, formulated the assessment and plan and placed orders.  CRITICAL CARE: The patient is critically ill with multiple organ systems failure and requires high complexity decision making for assessment and support, frequent evaluation and titration of therapies, application of advanced monitoring technologies and extensive interpretation of multiple databases. Critical Care Time devoted to patient care services described in this note is 30 minutes.   Luz Brazen, MD Pulmonary & Critical  Care Medicine December 09, 2014, 9:10 PM

## 2014-12-10 ENCOUNTER — Inpatient Hospital Stay (HOSPITAL_COMMUNITY): Payer: Medicare Other

## 2014-12-10 ENCOUNTER — Encounter (HOSPITAL_COMMUNITY): Payer: Self-pay | Admitting: Radiology

## 2014-12-10 DIAGNOSIS — I6789 Other cerebrovascular disease: Secondary | ICD-10-CM

## 2014-12-10 DIAGNOSIS — I619 Nontraumatic intracerebral hemorrhage, unspecified: Secondary | ICD-10-CM

## 2014-12-10 DIAGNOSIS — J9600 Acute respiratory failure, unspecified whether with hypoxia or hypercapnia: Secondary | ICD-10-CM

## 2014-12-10 DIAGNOSIS — I10 Essential (primary) hypertension: Secondary | ICD-10-CM

## 2014-12-10 DIAGNOSIS — I82409 Acute embolism and thrombosis of unspecified deep veins of unspecified lower extremity: Secondary | ICD-10-CM | POA: Insufficient documentation

## 2014-12-10 DIAGNOSIS — I82401 Acute embolism and thrombosis of unspecified deep veins of right lower extremity: Secondary | ICD-10-CM

## 2014-12-10 LAB — CBC WITH DIFFERENTIAL/PLATELET
Basophils Absolute: 0 10*3/uL (ref 0.0–0.1)
Basophils Relative: 0 % (ref 0–1)
EOS PCT: 0 % (ref 0–5)
Eosinophils Absolute: 0 10*3/uL (ref 0.0–0.7)
HEMATOCRIT: 32.8 % — AB (ref 39.0–52.0)
HEMOGLOBIN: 11.1 g/dL — AB (ref 13.0–17.0)
Lymphocytes Relative: 10 % — ABNORMAL LOW (ref 12–46)
Lymphs Abs: 1 10*3/uL (ref 0.7–4.0)
MCH: 32.6 pg (ref 26.0–34.0)
MCHC: 33.8 g/dL (ref 30.0–36.0)
MCV: 96.2 fL (ref 78.0–100.0)
MONOS PCT: 8 % (ref 3–12)
Monocytes Absolute: 0.8 10*3/uL (ref 0.1–1.0)
NEUTROS ABS: 7.8 10*3/uL — AB (ref 1.7–7.7)
Neutrophils Relative %: 82 % — ABNORMAL HIGH (ref 43–77)
Platelets: 174 10*3/uL (ref 150–400)
RBC: 3.41 MIL/uL — ABNORMAL LOW (ref 4.22–5.81)
RDW: 14.9 % (ref 11.5–15.5)
WBC: 9.6 10*3/uL (ref 4.0–10.5)

## 2014-12-10 LAB — GLUCOSE, CAPILLARY
GLUCOSE-CAPILLARY: 150 mg/dL — AB (ref 65–99)
GLUCOSE-CAPILLARY: 133 mg/dL — AB (ref 65–99)
Glucose-Capillary: 161 mg/dL — ABNORMAL HIGH (ref 65–99)
Glucose-Capillary: 157 mg/dL — ABNORMAL HIGH (ref 65–99)
Glucose-Capillary: 140 mg/dL — ABNORMAL HIGH (ref 65–99)

## 2014-12-10 LAB — BASIC METABOLIC PANEL
Anion gap: 7 (ref 5–15)
BUN: 19 mg/dL (ref 6–20)
CHLORIDE: 106 mmol/L (ref 101–111)
CO2: 26 mmol/L (ref 22–32)
Calcium: 8.7 mg/dL — ABNORMAL LOW (ref 8.9–10.3)
Creatinine, Ser: 0.9 mg/dL (ref 0.61–1.24)
GFR calc Af Amer: >60 mL/min (ref >60)
GFR calc non Af Amer: >60 mL/min (ref >60)
GLUCOSE: 145 mg/dL — AB (ref 65–99)
POTASSIUM: 4 mmol/L (ref 3.5–5.1)
SODIUM: 139 mmol/L (ref 135–145)

## 2014-12-10 LAB — LIPID PANEL
Cholesterol: 124 mg/dL (ref 0–200)
HDL: 30 mg/dL — ABNORMAL LOW (ref >40)
LDL Cholesterol: 63 mg/dL (ref 0–99)
Total CHOL/HDL Ratio: 4.1 RATIO
Triglycerides: 156 mg/dL — ABNORMAL HIGH (ref <150)
VLDL: 31 mg/dL (ref 0–40)

## 2014-12-10 MED ORDER — NICARDIPINE HCL IN NACL 20-0.86 MG/200ML-% IV SOLN: 3.0000 mg/h | INTRAVENOUS | Status: DC

## 2014-12-10 MED ORDER — LIDOCAINE HCL 1 % IJ SOLN
INTRAMUSCULAR | Status: AC
  Filled 2014-12-10: qty 20

## 2014-12-10 MED ORDER — IOHEXOL 300 MG/ML  SOLN
100.0000 mL | Freq: Once | INTRAMUSCULAR | Status: AC | PRN
  Administered 2014-12-10: 30 mL via INTRAVENOUS

## 2014-12-10 NOTE — Progress Notes (Signed)
STROKE TEAM PROGRESS NOTE   HISTORY Paul Dalton is an 81 y.o. male with multiple medical including HTN, hyperlipidemia, atrial fibrillation off anticoagulation, CAD, s/p pacemaker placement, TIA's, AAA, DVT on fullk dose Lovenox, OSA, brought in via EMS for evaluation of sudden onset expressive aphasia. Wife is at the bedside. He was home today 12/09/3014, ate lunch, and subsequently developed sudden onset of inability to speak and right sided weakness at 1300 (LKW). EMS was summoned and patient brought to the ED where he was alert and awake with NIHSS 22. CT brain showed no acute abnormality. Patient administered his dose of Lovenox this morning. NIHSS: 22. Patient was not administered TPA secondary to no intravenous thrombolysis was given as patient received Lovenox few hours before presentation. Taken to the interventional suite for angiogram and had Complete revascularization of angular branch ,and partial revascularization of the frontal branch with 7.5 mg of superselective IATPA        . He was admitted to the neuro ICU for further evaluation and treatment.   SUBJECTIVE (INTERVAL HISTORY) His wife and daughter ate at the bedside.  He remains intubated this am. They are concerned with him being off blood thinners for DVT.    OBJECTIVE Temp:  [97.3 F (36.3 C)-98 F (36.7 C)] 98 F (36.7 C) (06/27 0811) Pulse Rate:  [67-80] 73 (06/27 0900) Cardiac Rhythm:  [-] Ventricular paced (06/27 0800) Resp:  [15-26] 16 (06/27 0900) BP: (108-140)/(55-86) 132/62 mmHg (06/27 0900) SpO2:  [98 %-100 %] 100 % (06/27 0900) Arterial Line BP: (101-153)/(41-99) 153/71 mmHg (06/27 0900) FiO2 (%):  [30 %-50 %] 30 % (06/27 0900) Weight:  [102.8 kg (226 lb 10.1 oz)-106.601 kg (235 lb 0.2 oz)] 102.8 kg (226 lb 10.1 oz) (06/26 1825)   Recent Labs Lab 12/08/2014 1922 11/28/2014 2354 12/10/14 0347 12/10/14 0810  GLUCAP 151* 163* 150* 133*    Recent Labs Lab 11/16/2014 1425 11/26/2014 1436 12/10/14 0610   NA 138 137 139  K 4.4 4.3 4.0  CL 100* 101 106  CO2 27  --  26  GLUCOSE 244* 244* 145*  BUN 26* 28* 19  CREATININE 1.23 1.10 0.90  CALCIUM 9.3  --  8.7*    Recent Labs Lab 12/08/2014 1425  AST 60*  ALT 69*  ALKPHOS 207*  BILITOT 0.9  PROT 6.7  ALBUMIN 3.5    Recent Labs Lab 11/24/2014 1425 11/25/2014 1436 12/10/14 0610  WBC 8.0  --  9.6  NEUTROABS 5.6  --  7.8*  HGB 12.6* 13.6 11.1*  HCT 36.4* 40.0 32.8*  MCV 95.5  --  96.2  PLT 179  --  174   No results for input(s): CKTOTAL, CKMB, CKMBINDEX, TROPONINI in the last 168 hours.  Recent Labs  11/28/2014 1425  LABPROT 16.2*  INR 1.29   No results for input(s): COLORURINE, LABSPEC, PHURINE, GLUCOSEU, HGBUR, BILIRUBINUR, KETONESUR, PROTEINUR, UROBILINOGEN, NITRITE, LEUKOCYTESUR in the last 72 hours.  Invalid input(s): APPERANCEUR     Component Value Date/Time   CHOL 124 12/10/2014 0610   TRIG 156* 12/10/2014 0610   HDL 30* 12/10/2014 0610   CHOLHDL 4.1 12/10/2014 0610   VLDL 31 12/10/2014 0610   LDLCALC 63 12/10/2014 0610   Lab Results  Component Value Date   HGBA1C 6.8* 08/23/2014   No results found for: LABOPIA, COCAINSCRNUR, LABBENZ, AMPHETMU, THCU, LABBARB  No results for input(s): ETH in the last 168 hours.  Ct Head Wo Contrast  12/10/2014   CLINICAL DATA:  Follow-up evaluation. History  of hypertension, headache, aortic coarctation.  EXAM: CT HEAD WITHOUT CONTRAST  TECHNIQUE: Contiguous axial images were obtained from the base of the skull through the vertex without intravenous contrast.  COMPARISON:  CT head December 09, 2014  FINDINGS: New LEFT temporal parietal intraparenchymal 5 x 2.7 cm hematoma with small satellite parenchymal hemorrhages. Associated loss of the gray-white matter differentiation. In addition, loss of the mesial LEFT occipital lobe and LEFT frontal lobe gray-white matter differentiation with mild sulcal effacement. Dense apparent LEFT cortical veins without convincing evidence of extra-axial  blood products. 2 mm LEFT-to-RIGHT midline shift. Mild LEFT lateral ventricle effacement without hydrocephalus or entrapment.  Basal cisterns are patent. Moderate calcific atherosclerosis of the carotid siphons. Status post bilateral ocular lens implants. Paranasal sinuses and mastoid air cells are well aerated. No skull fracture.  IMPRESSION: 5 x 2.7 cm acute LEFT temporal parietal hematoma with small satellite hematomas.  Acute LEFT frontal, LEFT temporal, LEFT parietal and LEFT occipital lobe infarcts spanning LEFT middle cerebral and LEFT posterior cerebral artery territories.  2 mm LEFT to RIGHT midline shift without hydrocephalus or ventricular entrapment.  Acute findings discussed with and reconfirmed by Barry Dienes on 12/10/2014 at 5:24 am.   Electronically Signed   By: Elon Alas M.D.   On: 12/10/2014 05:25   Ct Head Wo Contrast  11/18/2014   CLINICAL DATA:  Stroke.  Post neurointerventional revascularization.  EXAM: CT HEAD WITHOUT CONTRAST  TECHNIQUE: Contiguous axial images were obtained from the base of the skull through the vertex without intravenous contrast.  COMPARISON:  Noncontrast head CT earlier this day at 1424 hr  FINDINGS: No interval hemorrhage. Contrast within the intra cerebral vasculature from recent procedure. There are a few scattered focal areas of hyperdensity throughout the left cerebral hemisphere, for example occipital lobe image 11, parietal lobe image 23/24. This may be related to venous stasis or intravascular calcification. No cerebral edema. There is diffuse mucosal thickening of the paranasal sinuses. The exam is otherwise unchanged.  IMPRESSION: 1. No postprocedural intracranial hemorrhage. 2. Scattered hypodense foci in the left cerebral hemisphere, may reflect intravascular contrast related to stasis versus calcifications, possibly embolic.   Electronically Signed   By: Jeb Levering M.D.   On: 12/08/2014 18:47   Ct Head (brain) Wo Contrast  12/03/2014    CLINICAL DATA:  Code stroke.  Right-sided weakness.  EXAM: CT HEAD WITHOUT CONTRAST  TECHNIQUE: Contiguous axial images were obtained from the base of the skull through the vertex without intravenous contrast.  COMPARISON:  08/23/2014; 05/26/2013  FINDINGS: Similar findings of advanced atrophy with diffuse sulcal prominence. Scattered periventricular hypodensities compatible microvascular ischemic disease. No CT evidence of acute large territory infarct. No intraparenchymal or extra-axial mass or hemorrhage. Unchanged size and configuration of the ventricles and basilar cisterns. No midline shift. Intracranial atherosclerosis. Limited visualization the paranasal sinuses and mastoid air cells is normal. No air-fluid levels. Regional soft tissues appear normal. Post bilateral cataract surgery. No displaced calvarial fracture.  IMPRESSION: Similar findings of atrophy and microvascular ischemic disease without acute intracranial process.  Critical Value/emergent results were called by telephone at the time of interpretation on 12/07/2014 at 2:32 pm to Dr. Aram Beecham, who verbally acknowledged these results.   Electronically Signed   By: Sandi Mariscal M.D.   On: 12/08/2014 14:35   Dg Chest Port 1 View  12/10/2014   CLINICAL DATA:  Stroke.  EXAM: PORTABLE CHEST - 1 VIEW  COMPARISON:  12/06/2014  FINDINGS: Endotracheal tube is 5.3 cm from the  carina. There are multiple overlying monitoring devices, an enteric tube is not definitively seen. Dual lead right-sided pacemaker remains in place. Cardiomediastinal contours are unchanged, heart at the upper limits of normal in size. No consolidation, pleural effusion or pneumothorax. No pulmonary edema.  IMPRESSION: 1. Endotracheal tube 5.3 cm from the carina. 2. Clear lungs.   Electronically Signed   By: Jeb Levering M.D.   On: 12/10/2014 00:39   Cerebral Angiogram 1occluded prominent angular branch of LT MCA andfrontal branch of superior division. Complete revascularization  of angular branch ,and partial revascularization of the frontal branch with 7.5 mg of superselective IATPA    PHYSICAL EXAM Elderly caucasian male not in distress. . Afebrile. Head is nontraumatic. Neck is supple without bruit.    Cardiac exam no murmur or gallop. Lungs are clear to auscultation. Distal pulses are well felt. He has right groin arterial sheath Neurological Exam : Patient is intubated. Awake alert and interactive. Pupils are 4 mm equal reactive. Fundi were not visualized. Blinks to threat more on the left than the right. Right lower facial weakness. Tongue is midline. Extraocular movements are full range without nystagmus or restriction. Right hemiparesis but able to move her right hand and lower extremity grade 1-2/5 strength. Normal antigravity movements on the left side. Diminished sensation on the right compared to the left. Right plantar upgoing left downgoing. Gait was not tested.  ASSESSMENT/PLAN Mr. AMYR SLUDER is a 81 y.o. male with history of CAD, DM, prior TIA, and seizure in March of 2016, found to have a DVT within 1 week and started on lovenox 100 mg BID presenting with sudden onset of aphasia. He did not receive IV t-PA due to being on full dose lovenox, but was taken to neurointervention for angio, s/p complete revascularization of L MCA angular branch and partial revascularization of the frontal branch with 7.5 mg of superselective IA TPA.  Stroke:  Left MCA and PCA territory infarcts s/p partial revascularization w/ neurointervenion now with L temporal parietal and satellite hematomas, infarct likely embolic secondary to unknown etiology. Hematoma asymptomatic secondary to IA tPA/intervention  Resultant  VDRF, R hemiparesis  CT  Post intervention 5 x 2.7 cm acute LEFT temporal parietal hematoma with small satellite hematomas.  Acute LEFT frontal, LEFT temporal, LEFT parietal and LEFT occipital lobe infarcts spanning LEFT middle cerebral and LEFT posterior cerebral  artery territories.  Consider MRI    Cerebral angio occluded prominent angular branch of LT MCA  2D Echo  pending   LDL 63  HgbA1c 6.8 in March 2016  SCDs ordered for VTE prophylaxis Diet NPO time specified  full dose lovenox prior to admission, now on no antithrombotic as within 24h of tPA  D/c shealth  Therapy recommendations:  pending   Disposition:  pending   Respiratory Failure   Intubated for neuro intervention  CCM managing vent  Extubate per CCM  DVT  Recent dx  On full dose lovenox  Given hemorrhage, have asked CCM to get filter placed  to prevent PE  Essential Hypertension Diastolic HF  Home meds:   Diltiazem, cardura, lasix, metoprolol  Stable  Hyperlipidemia  Home meds:  lipitor 40   LDL 63, goal < 70  Resume statin none able to swallow and continue at discharge  Diabetes  HgbA1c 6.21 August 2014, goal < 7.0  Controlled  Other Stroke Risk Factors  Advanced age  Former Cigarette smoker, quit smoking years ago   ETOH use  Hx stroke/TIA - Hx TIA  1.5 yrs ago  Family hx stroke (father)  Coronary artery disease, MI  Obstructive sleep apnea  Other Active Problems  Mild renal insufficiency 0.9  Hospital day # [place] for Pager information 12/10/2014 9:51 AM  I have personally examined this patient, reviewed notes, independently viewed imaging studies, participated in medical decision making and plan of care. I have made any additions or clarifications directly to the above note. Agree with note above.  He presented with a large left MCA infarct but could not be given IV TPA since she had received full dose Lovenox. He had partial recanalization with mechanical embolectomy and intra-arterial TPA. Follow-up brain scan shows large left MCA infarct with hemorrhagic transformation but clinically she seems to have shown improvement. He remains at risk for neurological worsening, recurrent  stroke, TIA and brain hemorrhage and needs ongoing aggressive evaluation.He may need IVC filter given her recent DVT and she is not an anticoagulation candidate for the short-term at least due to brain hemorrhage. Had a long discussion with the patient's wife at the bedside regarding his prognosis, plan of care and answered questions. Discontinue groin arterial sheath today followed by extubation later if tolerated. No antiplatelets therapy for a few days still CT scan showed isodense hemorrhage. This patient is critically ill and at significant risk of neurological worsening, death and care requires constant monitoring of vital signs, hemodynamics,respiratory and cardiac monitoring, extensive review of multiple databases, frequent neurological assessment, discussion with family, other specialists and medical decision making of high complexity.I have made any additions or clarifications directly to the above note.This critical care time does not reflect procedure time, or teaching time or supervisory time of PA/NP/Med Resident etc but could involve care discussion time.  I spent 40 minutes of neurocritical care time  in the care of  this patient.   Antony Contras, MD Medical Director [facility] Pager: [phone] 12/10/2014 1:45 PM   To contact Stroke Continuity provider, please refer to [email or web address] After hours, contact General Neurology Mindi used to going to classes to training?

## 2014-12-10 NOTE — Progress Notes (Signed)
Pt transported to and from 3M02 to IR on ventilator. Pt stable throughout with no complications. Pt returned to 30% fio2 upon return to 3M02. RT will continue to monitor.

## 2014-12-10 NOTE — Consult Note (Signed)
Chief Complaint: Chief Complaint  Patient presents with  . Code Stroke  developed new B DVT (6/21) on Lovenox  Referring Physician(s): CCM  History of Present Illness: Paul Dalton is a 81 y.o. male   Pt had been on chronic coumadin for A fib and previous DVT since 1988 Had done well until new development of leg pain and "red streaks" up legs per wife 6/21 doppler revealed B DVT Started on Lovenox that day and came off coumadin 6/26 developed TIA like sxs- progressed to Rt sided weakness To ED Code Stroke Dr Estanislado Pandy performed cerebral arteriogram and L MCA ia tpa 6/26 pm B sheaths intact  CT Head 6/27:IMPRESSION: 5 x 2.7 cm acute LEFT temporal parietal hematoma with small satellite hematomas.  Acute LEFT frontal, LEFT temporal, LEFT parietal and LEFT occipital lobe infarcts spanning LEFT middle cerebral and LEFT posterior cerebral artery territories.  2 mm LEFT to RIGHT midline shift without hydrocephalus or ventricular entrapment.  Now not candidate for anticoagulation Inferior vena cava filter requested per Dr Nelda Marseille I have seen and examined pt    Past Medical History  Diagnosis Date  . Angina   . Coronary artery disease   . Heart murmur   . Hypertension     hyperlipidemia  . Shortness of breath     with exertion  . Recurrent upper respiratory infection (URI)     statrted with cold symptoms 05/30/11- large amount clear drainage out of nose, with cough- non productive- states chest hurts from coughing so much.  ?fever last PM  . Sleep apnea     last study years ago- SEVERS PER PATIENT- does not wear mask  . Chronic Dalton disease   . Renal lithiasis     states in both kidneys, hematuria  . H/O hiatal hernia   . Headache(784.0)     history of migraines  . Stroke     3 mini strokes-last one 1 1/2 yrs ago  . Cancer     skin cancer left ear  . Arthritis   . Neuromuscular disorder     polymyalgia rheumatic with giant cell arteritis x 1 1/2-  states no permanent eye damage  . Dysrhythmia     atrial fibrilliation,s/p PPM,bradycardia s/p PPM, hx tachy-brady syndrome. LAST OV WITH INTERROGATION ON CHART. Clearance Dr Lovena Le with ok to stop coumadin 5 days pre op on chart  . Myocardial infarction     states silent heart attack years ago, but no damage-- stress test 2009 on chart  . Hematuria   . Syncope and collapse 05/26/13    ER visit 05/26/13 due to syncope. Possibly due to doxazosin.  . Diabetes mellitus with neurological manifestations, uncontrolled   . Edema   . Impotence of organic origin     ED  . Peptic ulcer, unspecified site, unspecified as acute or chronic, without mention of hemorrhage or perforation, with obstruction   . Pure hypercholesterolemia   . Chronic ischemic heart disease, unspecified   . Anemia, unspecified   . Asthmatic bronchitis   . AAA (abdominal aortic aneurysm)   . Carotid stenosis   . BPH (benign prostatic hyperplasia)   . Hemorrhoids   . LBBB (left bundle branch block)   . Eczema   . Actinic keratoses   . Nephrolithiasis   . Vitamin B12 deficiency   . Allergic rhinitis   . History of MI (myocardial infarction)   . Osteoarthritis   . Esophageal reflux   . CHF (congestive heart failure)   .  History of TIA (transient ischemic attack)   . Polymyalgia rheumatica   . Low back pain   . Hard of hearing   . Anaphylactic reaction     Tree nut (WALNUT ONLY)  . Coronary artery ectasia   . Carotid bruit   . Stable angina   . Dyspnea   . At high risk for falls   . Syncope   . Back pain   . Peripheral neuropathic pain     Past Surgical History  Procedure Laterality Date  . Insert / replace / remove pacemaker      St Jude- last interrogation 7/12- followed by Dr Lovena Le  . Cardiac catheterization      x 2 -years ago  . Hemorrhoid surgery    . Eye surgery      bilateral cataract extraction with IOL  . Appendectomy    . Hernia repair      bilateral inguinal  . Rotator cuff repair       right  . Doppler echocardiography  2009  . Ureteroscopy  06/24/2011    Procedure: URETEROSCOPY;  Surgeon: Molli Hazard, MD;  Location: [facility];  Service: Urology;  Laterality: Left;  . Cystoscopy w/ retrogrades  06/24/2011    Procedure: CYSTOSCOPY WITH RETROGRADE PYELOGRAM;  Surgeon: Molli Hazard, MD;  Location: [facility];  Service: Urology;  Laterality: Left;  . Cardioversion  03/1998  . Left and right heart catheterization with coronary angiogram N/A 09/22/2013    Procedure: LEFT AND RIGHT HEART CATHETERIZATION WITH CORONARY ANGIOGRAM ;  Surgeon: Peter M Martinique, MD;  Location: [facility] CATH LAB;  Service: Cardiovascular;  Laterality: N/A;  . Radiology with anesthesia N/A 12/07/2014    Procedure: RADIOLOGY WITH ANESTHESIA;  Surgeon: Luanne Bras, MD;  Location: [facility];  Service: Radiology;  Laterality: N/A;    Allergies: Peanut-containing drug products; Nisoldipine; Procardia; Sular; Viagra; Isosorbide mononitrate; Sulfa antibiotics; Vioxx; and Zithromax  Medications: Prior to Admission medications   Medication Sig Start Date End Date Taking? Authorizing Provider  atorvastatin (LIPITOR) 40 MG tablet Take 20 mg by mouth at bedtime.    Yes Historical Provider, MD  cholecalciferol (VITAMIN D) 400 UNITS TABS Take 400 Units by mouth every evening.    Yes Historical Provider, MD  Cinnamon 500 MG capsule Take 500 mg by mouth 2 (two) times daily.    Yes Historical Provider, MD  digoxin (LANOXIN) 0.125 MG tablet Take 1 tablet (0.125 mg total) by mouth daily. Patient taking differently: Take 0.125 mg by mouth every morning.  10/31/13  Yes Evans Lance, MD  diltiazem (CARDIZEM CD) 180 MG 24 hr capsule Take 1 capsule (180 mg total) by mouth at bedtime. 10/31/13  Yes Evans Lance, MD  doxazosin (CARDURA) 4 MG tablet Take 2 mg by mouth at bedtime.    Yes Historical Provider, MD  enoxaparin (LOVENOX) 100 MG/ML injection Inject 100 mg into the skin every 12 (twelve) hours.  12/04/14  Yes Historical  Provider, MD  finasteride (PROSCAR) 5 MG tablet Take 5 mg by mouth at bedtime.    Yes Historical Provider, MD  folic acid (FOLVITE) 638 MCG tablet Take 400 mcg by mouth every evening.    Yes Historical Provider, MD  furosemide (LASIX) 40 MG tablet Take 1 tablet (40 mg total) by mouth 2 (two) times daily. 10/31/13  Yes Evans Lance, MD  glucosamine-chondroitin 500-400 MG tablet Take 1 tablet by mouth 2 (two) times daily.    Yes Historical Provider, MD  Ruthell Rummage  KWIKPEN 100 UNIT/ML KiwkPen Inject 5 Units into the skin 2 (two) times daily before a meal.  10/18/14  Yes Historical Provider, MD  insulin glargine (LANTUS) 100 UNIT/ML injection Inject 0.45 mLs (45 Units total) into the skin daily. Patient taking differently: Inject 45 Units into the skin every morning.  08/25/14  Yes Geradine Girt, DO  metoprolol (LOPRESSOR) 100 MG tablet Take 100 mg by mouth 2 (two) times daily.    Yes Historical Provider, MD  Misc Natural Products (APPLE CIDER VINEGAR) TABS Take 1 tablet by mouth 2 (two) times daily.    Yes Historical Provider, MD  omeprazole (PRILOSEC) 20 MG capsule Take 20 mg by mouth 2 (two) times daily.    Yes Historical Provider, MD  oxyCODONE-acetaminophen (PERCOCET/ROXICET) 5-325 MG per tablet Take 1 tablet by mouth every 8 (eight) hours as needed for severe pain. 11/28/14  Yes Marcial Pacas, MD  potassium chloride SA (KLOR-CON M20) 20 MEQ tablet Take 1 tablet (20 mEq total) by mouth daily. Patient taking differently: Take 20 mEq by mouth every morning.  10/10/13  Yes Evans Lance, MD  sitaGLIPtin-metformin (JANUMET) 50-1000 MG per tablet Take 0.5-1 tablets by mouth 2 (two) times daily with a meal. Take one tablet in the morning and half of tablet in the evening   Yes Historical Provider, MD  spironolactone (ALDACTONE) 25 MG tablet Take 25 mg by mouth every morning.  11/01/14  Yes Historical Provider, MD  traMADol-acetaminophen (ULTRACET) 37.5-325 MG per tablet Take 1 tablet by mouth every 6 (six) hours as  needed for moderate pain or severe pain.   Yes Historical Provider, MD  vitamin B-12 (CYANOCOBALAMIN) 1000 MCG tablet Take 1,000 mcg by mouth 2 (two) times daily.    Yes Historical Provider, MD  ACCU-CHEK AVIVA PLUS test strip  08/02/13   Historical Provider, MD  glucagon 1 MG injection Inject 1 mg into the vein once as needed. 08/25/14   Geradine Girt, DO  nitroGLYCERIN (NITROSTAT) 0.4 MG SL tablet Place 0.4 mg under the tongue every 5 (five) minutes as needed. CHEST PAIN     Historical Provider, MD  NON FORMULARY O2 AT NIGHT    Historical Provider, MD     Family History  Problem Relation Age of Onset  . Heart disease Mother   . CVA Father     History   Social History  . Marital Status: Married    Spouse Name: N/A  . Number of Children: 3  . Years of Education: 16   Occupational History  . Retired    Social History Main Topics  . Smoking status: Former Smoker -- 30 years    Types: Cigarettes, Pipe, Cigars    Quit date: 06/01/1987  . Smokeless tobacco: Never Used  . Alcohol Use: No  . Drug Use: No  . Sexual Activity: Not on file   Other Topics Concern  . None   Social History Narrative   Lives at home with wife, Paul Dalton.   Right-handed.   3 cups caffeine per day.    Review of Systems: A 12 point ROS discussed and pertinent positives are indicated in the HPI above.  All other systems are negative.  Review of Systems  Constitutional:       On vent No response    Vital Signs: BP 126/46 mmHg  Pulse 70  Temp(Src) 99.1 F (37.3 C) (Axillary)  Resp 22  Ht 6\' 4"  (1.93 m)  Wt 226 lb 10.1 oz (102.8 kg)  BMI  27.60 kg/m2  SpO2 100%  Physical Exam  Cardiovascular: Normal rate.   No murmur heard. Pulmonary/Chest: He is in respiratory distress.  Psychiatric:  Consented wife at bedside for IVC filter placement  Nursing note and vitals reviewed.   Mallampati Score:  MD Evaluation Airway: Other (comments) Airway comments: vent Heart: WNL Abdomen: WNL Chest/  Lungs: WNL ASA  Classification: 3 Mallampati/Airway Score: Three  Imaging: Ct Head Wo Contrast  12/10/2014   CLINICAL DATA:  Follow-up evaluation. History of hypertension, headache, aortic coarctation.  EXAM: CT HEAD WITHOUT CONTRAST  TECHNIQUE: Contiguous axial images were obtained from the base of the skull through the vertex without intravenous contrast.  COMPARISON:  CT head December 09, 2014  FINDINGS: New LEFT temporal parietal intraparenchymal 5 x 2.7 cm hematoma with small satellite parenchymal hemorrhages. Associated loss of the gray-white matter differentiation. In addition, loss of the mesial LEFT occipital lobe and LEFT frontal lobe gray-white matter differentiation with mild sulcal effacement. Dense apparent LEFT cortical veins without convincing evidence of extra-axial blood products. 2 mm LEFT-to-RIGHT midline shift. Mild LEFT lateral ventricle effacement without hydrocephalus or entrapment.  Basal cisterns are patent. Moderate calcific atherosclerosis of the carotid siphons. Status post bilateral ocular lens implants. Paranasal sinuses and mastoid air cells are well aerated. No skull fracture.  IMPRESSION: 5 x 2.7 cm acute LEFT temporal parietal hematoma with small satellite hematomas.  Acute LEFT frontal, LEFT temporal, LEFT parietal and LEFT occipital lobe infarcts spanning LEFT middle cerebral and LEFT posterior cerebral artery territories.  2 mm LEFT to RIGHT midline shift without hydrocephalus or ventricular entrapment.  Acute findings discussed with and reconfirmed by Paul Dalton on 12/10/2014 at 5:24 am.   Electronically Signed   By: Elon Alas M.D.   On: 12/10/2014 05:25   Ct Head Wo Contrast  11/23/2014   CLINICAL DATA:  Stroke.  Post neurointerventional revascularization.  EXAM: CT HEAD WITHOUT CONTRAST  TECHNIQUE: Contiguous axial images were obtained from the base of the skull through the vertex without intravenous contrast.  COMPARISON:  Noncontrast head CT earlier this  day at 1424 hr  FINDINGS: No interval hemorrhage. Contrast within the intra cerebral vasculature from recent procedure. There are a few scattered focal areas of hyperdensity throughout the left cerebral hemisphere, for example occipital lobe image 11, parietal lobe image 23/24. This may be related to venous stasis or intravascular calcification. No cerebral edema. There is diffuse mucosal thickening of the paranasal sinuses. The exam is otherwise unchanged.  IMPRESSION: 1. No postprocedural intracranial hemorrhage. 2. Scattered hypodense foci in the left cerebral hemisphere, may reflect intravascular contrast related to stasis versus calcifications, possibly embolic.   Electronically Signed   By: Jeb Levering M.D.   On: 12/08/2014 18:47   Ct Head (brain) Wo Contrast  12/11/2014   CLINICAL DATA:  Code stroke.  Right-sided weakness.  EXAM: CT HEAD WITHOUT CONTRAST  TECHNIQUE: Contiguous axial images were obtained from the base of the skull through the vertex without intravenous contrast.  COMPARISON:  08/23/2014; 05/26/2013  FINDINGS: Similar findings of advanced atrophy with diffuse sulcal prominence. Scattered periventricular hypodensities compatible microvascular ischemic disease. No CT evidence of acute large territory infarct. No intraparenchymal or extra-axial mass or hemorrhage. Unchanged size and configuration of the ventricles and basilar cisterns. No midline shift. Intracranial atherosclerosis. Limited visualization the paranasal sinuses and mastoid air cells is normal. No air-fluid levels. Regional soft tissues appear normal. Post bilateral cataract surgery. No displaced calvarial fracture.  IMPRESSION: Similar findings of atrophy and microvascular  ischemic disease without acute intracranial process.  Critical Value/emergent results were called by telephone at the time of interpretation on 11/23/2014 at 2:32 pm to Dr. Aram Beecham, who verbally acknowledged these results.   Electronically Signed   By: Sandi Mariscal M.D.   On: 12/01/2014 14:35   Dg Chest Port 1 View  12/10/2014   CLINICAL DATA:  Stroke.  EXAM: PORTABLE CHEST - 1 VIEW  COMPARISON:  12/06/2014  FINDINGS: Endotracheal tube is 5.3 cm from the carina. There are multiple overlying monitoring devices, an enteric tube is not definitively seen. Dual lead right-sided pacemaker remains in place. Cardiomediastinal contours are unchanged, heart at the upper limits of normal in size. No consolidation, pleural effusion or pneumothorax. No pulmonary edema.  IMPRESSION: 1. Endotracheal tube 5.3 cm from the carina. 2. Clear lungs.   Electronically Signed   By: Jeb Levering M.D.   On: 12/10/2014 00:39    Labs:  CBC:  Recent Labs  08/23/14 0820 08/24/14 0309 11/14/2014 1425 12/07/2014 1436 12/10/14 0610  WBC 8.8 6.2 8.0  --  9.6  HGB 11.3* 10.9* 12.6* 13.6 11.1*  HCT 33.1* 32.5* 36.4* 40.0 32.8*  PLT 144* 146* 179  --  174    COAGS:  Recent Labs  08/23/14 1718 08/24/14 0309 08/25/14 0530 11/20/2014 1425  INR 2.00* 1.90* 1.88* 1.29  APTT  --   --   --  38*    BMP:  Recent Labs  08/23/14 0820 08/24/14 0309 12/04/2014 1425 11/27/2014 1436 12/10/14 0610  NA 141 141 138 137 139  K 2.4* 4.3 4.4 4.3 4.0  CL 104 105 100* 101 106  CO2 26 29 27   --  26  GLUCOSE 171* 157* 244* 244* 145*  BUN 20 14 26* 28* 19  CALCIUM 9.0 9.0 9.3  --  8.7*  CREATININE 1.00 0.94 1.23 1.10 0.90  GFRNONAA 68* 76* 52*  --  >60  GFRAA 79* 88* >60  --  >60    LIVER FUNCTION TESTS:  Recent Labs  08/23/14 1615 11/20/2014 1425  BILITOT 0.8 0.9  AST 26 60*  ALT 25 69*  ALKPHOS 63 207*  PROT 5.9* 6.7  ALBUMIN 3.4* 3.5    TUMOR MARKERS: No results for input(s): AFPTM, CEA, CA199, CHROMGRNA in the last 8760 hours.  Assessment and Plan:  B DVT 12/04/14- while on coumadin for Afib since 1988 Changed to Lovenox that day---suffered CVA 6/26 IA tpa 6/26 in IR with Dr Estanislado Pandy New hemorrhage on CT Head 6/27 No longer candidate for anticoagulation Now  scheduled IVC filter placement Risks and Benefits discussed with the patient including, but not limited to bleeding, infection, contrast induced renal failure, filter fracture or migration which can lead to emergency surgery or even death, strut penetration with damage or irritation to adjacent structures and caval thrombosis. All of the patient's questions were answered, patient is agreeable to proceed. Consent signed and in chart.  Orders in for sheath pull  Thank you for this interesting consult.  I greatly enjoyed meeting Paul Dalton and look forward to participating in their care.  Signed: Siraj Dermody A 12/10/2014, 1:03 PM   I spent a total of 20 Minutes    in face to face in clinical consultation, greater than 50% of which was counseling/coordinating care for IVC filter placement

## 2014-12-10 NOTE — Progress Notes (Signed)
PT Cancellation Note  Patient Details Name: Paul Dalton MRN: [id number] DOB: April 26, 1930   Cancelled Treatment:    Reason Eval/Treat Not Completed: Patient not medically ready.  Pt currently on bedrest and with Femoral sheath.  Will hold PT and mobility at this time.     Jaloni Davoli, Thornton Papas 12/10/2014, 7:53 AM

## 2014-12-10 NOTE — Progress Notes (Signed)
  Echocardiogram 2D Echocardiogram has been performed.  Paul Dalton 12/10/2014, 5:36 PM

## 2014-12-10 NOTE — Progress Notes (Signed)
SLP Cancellation Note  Patient Details Name: KANE KUSEK MRN: [id number] DOB: [birth year]   Cancelled treatment:       Reason Eval/Treat Not Completed: Medical issues which prohibited therapy (Pt remains on vent. Will f/u 6/28. )  Gabriel Rainwater [place], [place] [phone]  Asheton Scheffler Meryl 12/10/2014, 9:01 AM

## 2014-12-10 NOTE — Progress Notes (Signed)
2147 paged neurology regarding no movement in RUE. Sedation paused. No purposeful movement or movement to painful stimulus. Orders for stat CT scan per Dr. Doy Mince. RT called. CT called to send transport.

## 2014-12-10 NOTE — Procedures (Signed)
Interventional Radiology Procedure Note  Procedure: Placement of an IVC filter via right IJ.  Denali placed low IVC, below the bilateral renal veins, and below largest diameter of the IVC.  Complications: None Recommendations:  - Ok to shower tomorrow - Do not submerge for 7 days - Routine care  - Potentially retrievable filter.  May be referred to the [facility] clinic [phone]) to be evaluated for removal once discharged from the hospital and may receive anti-coagulation vs resolution of DVT.   Signed,  Dulcy Fanny. Earleen Newport, DO

## 2014-12-10 NOTE — H&P (Signed)
PULMONARY  / CRITICAL CARE MEDICINE CONSULTATION   Name: Paul Dalton MRN: [id number] DOB: Jun 02, 1930    ADMISSION DATE:  11/17/2014 CONSULTATION DATE: December 10, 2014  REQUESTING CLINICIAN: Garvin Fila, MD PRIMARY SERVICE: Neurology  CHIEF COMPLAINT:  Sudden onset aphasia  BRIEF PATIENT DESCRIPTION: 81 y/o man with multiple risk factors for stroke on Lovenox for treatment of DVT who presented with left MCA stroke s/p revascularization and catheter directed TPA.  SIGNIFICANT EVENTS / STUDIES:  11/26/2014 - underwent intervention of acute L MCA stroke with 7.5 mg of superselective catheter-directed tPA.  LINES / TUBES: 8.0 ETT - 6/26 R fem Art line - 6/26 L fem arterial sheath - 6/26 Foley - 6/26  CULTURES: none  ANTIBIOTICS: Ancef x1 6/26  HISTORY OF PRESENT ILLNESS:  Paul Dalton is a 81 year old man with a history of CAD, DM, prior TIA, and seizure in March of 2016. He was found to have a DVT within a week of his presentation and was started on lovenox, 100 mg BID. He was brought to the ED on 6/26 after developing sudden onset aphasia and R sided weakness. He had a NIHSS of 22 and a head CT did not demonstrate any hemorrhage. Due to his anticoagulated state, tPA was not given, but he was taken by IR for catheter intervention where he had an angular branch of his L MCA occluded, as well as frontal branch of a superior division of the same. He had complete revascularization of the angular branch, and partial revascularization of the frontal branch. He was brought to the ICU and remain intubated. PCCM was consulted for critical care management.  SUBJECTIVE: Awake and interactive.  VITAL SIGNS: Temp:  [97.3 F (36.3 C)-98 F (36.7 C)] 98 F (36.7 C) (06/27 0811) Pulse Rate:  [67-80] 73 (06/27 0900) Resp:  [15-26] 16 (06/27 0900) BP: (108-140)/(55-86) 132/62 mmHg (06/27 0900) SpO2:  [98 %-100 %] 100 % (06/27 0900) Arterial Line BP: (101-153)/(41-99) 153/71 mmHg (06/27  0900) FiO2 (%):  [30 %-50 %] 30 % (06/27 0900) Weight:  [102.8 kg (226 lb 10.1 oz)-106.601 kg (235 lb 0.2 oz)] 102.8 kg (226 lb 10.1 oz) (06/26 1825) HEMODYNAMICS:   VENTILATOR SETTINGS: Vent Mode:  [-] PRVC FiO2 (%):  [30 %-50 %] 30 % Set Rate:  [16 bmp-20 bmp] 20 bmp Vt Set:  [550 mL-650 mL] 550 mL PEEP:  [5 cmH20] 5 cmH20 Plateau Pressure:  [13 cmH20-16 cmH20] 13 cmH20 INTAKE / OUTPUT: Intake/Output      06/26 0701 - 06/27 0700 06/27 0701 - 06/28 0700   I.V. (mL/kg) 1599.9 (15.6) 426.9 (4.2)   Total Intake(mL/kg) 1599.9 (15.6) 426.9 (4.2)   Urine (mL/kg/hr) 1405 650 (2.1)   Blood 100    Total Output 1505 650   Net +94.9 -223.1         PHYSICAL EXAMINATION: General:  Elderly man, intubated Neuro:  Awake, able to answer yes/no appropriately moving all ext to command HEENT:  MMM Neck: No masses Cardiovascular:  Heart sounds dual Lungs:  CTAB Abdomen:  Soft Musculoskeletal:  No deformities. Skin:  Scattered ecchymoses.  LABS:  CBC  Recent Labs Lab 11/16/2014 1425 11/20/2014 1436 12/10/14 0610  WBC 8.0  --  9.6  HGB 12.6* 13.6 11.1*  HCT 36.4* 40.0 32.8*  PLT 179  --  174   Coag's  Recent Labs Lab 12/11/2014 1425  APTT 38*  INR 1.29   BMET  Recent Labs Lab 12/13/2014 1425 11/24/2014 1436 12/10/14 0610  NA 138 137 139  K 4.4 4.3 4.0  CL 100* 101 106  CO2 27  --  26  BUN 26* 28* 19  CREATININE 1.23 1.10 0.90  GLUCOSE 244* 244* 145*   Electrolytes  Recent Labs Lab 12/01/2014 1425 12/10/14 0610  CALCIUM 9.3 8.7*   Sepsis Markers No results for input(s): LATICACIDVEN, PROCALCITON, O2SATVEN in the last 168 hours. ABG  Recent Labs Lab 12/04/2014 1835  PHART 7.379  PCO2ART 44.4  PO2ART 214*   Liver Enzymes  Recent Labs Lab 12/12/2014 1425  AST 60*  ALT 69*  ALKPHOS 207*  BILITOT 0.9  ALBUMIN 3.5   Cardiac Enzymes No results for input(s): TROPONINI, PROBNP in the last 168 hours. Glucose  Recent Labs Lab 11/23/2014 1922 12/08/2014 2354  12/10/14 0347 12/10/14 0810  GLUCAP 151* 163* 150* 133*    Imaging Ct Head Wo Contrast  12/10/2014   CLINICAL DATA:  Follow-up evaluation. History of hypertension, headache, aortic coarctation.  EXAM: CT HEAD WITHOUT CONTRAST  TECHNIQUE: Contiguous axial images were obtained from the base of the skull through the vertex without intravenous contrast.  COMPARISON:  CT head December 09, 2014  FINDINGS: New LEFT temporal parietal intraparenchymal 5 x 2.7 cm hematoma with small satellite parenchymal hemorrhages. Associated loss of the gray-white matter differentiation. In addition, loss of the mesial LEFT occipital lobe and LEFT frontal lobe gray-white matter differentiation with mild sulcal effacement. Dense apparent LEFT cortical veins without convincing evidence of extra-axial blood products. 2 mm LEFT-to-RIGHT midline shift. Mild LEFT lateral ventricle effacement without hydrocephalus or entrapment.  Basal cisterns are patent. Moderate calcific atherosclerosis of the carotid siphons. Status post bilateral ocular lens implants. Paranasal sinuses and mastoid air cells are well aerated. No skull fracture.  IMPRESSION: 5 x 2.7 cm acute LEFT temporal parietal hematoma with small satellite hematomas.  Acute LEFT frontal, LEFT temporal, LEFT parietal and LEFT occipital lobe infarcts spanning LEFT middle cerebral and LEFT posterior cerebral artery territories.  2 mm LEFT to RIGHT midline shift without hydrocephalus or ventricular entrapment.  Acute findings discussed with and reconfirmed by Barry Dienes on 12/10/2014 at 5:24 am.   Electronically Signed   By: Elon Alas M.D.   On: 12/10/2014 05:25   Ct Head Wo Contrast  12/08/2014   CLINICAL DATA:  Stroke.  Post neurointerventional revascularization.  EXAM: CT HEAD WITHOUT CONTRAST  TECHNIQUE: Contiguous axial images were obtained from the base of the skull through the vertex without intravenous contrast.  COMPARISON:  Noncontrast head CT earlier this day  at 1424 hr  FINDINGS: No interval hemorrhage. Contrast within the intra cerebral vasculature from recent procedure. There are a few scattered focal areas of hyperdensity throughout the left cerebral hemisphere, for example occipital lobe image 11, parietal lobe image 23/24. This may be related to venous stasis or intravascular calcification. No cerebral edema. There is diffuse mucosal thickening of the paranasal sinuses. The exam is otherwise unchanged.  IMPRESSION: 1. No postprocedural intracranial hemorrhage. 2. Scattered hypodense foci in the left cerebral hemisphere, may reflect intravascular contrast related to stasis versus calcifications, possibly embolic.   Electronically Signed   By: Jeb Levering M.D.   On: 12/04/2014 18:47   Ct Head (brain) Wo Contrast  11/14/2014   CLINICAL DATA:  Code stroke.  Right-sided weakness.  EXAM: CT HEAD WITHOUT CONTRAST  TECHNIQUE: Contiguous axial images were obtained from the base of the skull through the vertex without intravenous contrast.  COMPARISON:  08/23/2014; 05/26/2013  FINDINGS: Similar findings of advanced  atrophy with diffuse sulcal prominence. Scattered periventricular hypodensities compatible microvascular ischemic disease. No CT evidence of acute large territory infarct. No intraparenchymal or extra-axial mass or hemorrhage. Unchanged size and configuration of the ventricles and basilar cisterns. No midline shift. Intracranial atherosclerosis. Limited visualization the paranasal sinuses and mastoid air cells is normal. No air-fluid levels. Regional soft tissues appear normal. Post bilateral cataract surgery. No displaced calvarial fracture.  IMPRESSION: Similar findings of atrophy and microvascular ischemic disease without acute intracranial process.  Critical Value/emergent results were called by telephone at the time of interpretation on 12/03/2014 at 2:32 pm to Dr. Aram Beecham, who verbally acknowledged these results.   Electronically Signed   By: Sandi Mariscal M.D.   On: 11/28/2014 14:35   Dg Chest Port 1 View  12/10/2014   CLINICAL DATA:  Stroke.  EXAM: PORTABLE CHEST - 1 VIEW  COMPARISON:  12/06/2014  FINDINGS: Endotracheal tube is 5.3 cm from the carina. There are multiple overlying monitoring devices, an enteric tube is not definitively seen. Dual lead right-sided pacemaker remains in place. Cardiomediastinal contours are unchanged, heart at the upper limits of normal in size. No consolidation, pleural effusion or pneumothorax. No pulmonary edema.  IMPRESSION: 1. Endotracheal tube 5.3 cm from the carina. 2. Clear lungs.   Electronically Signed   By: Jeb Levering M.D.   On: 12/10/2014 00:39    EKG: V-paced rhythm. CXR: Pending  ASSESSMENT / PLAN:  PULMONARY A: Need for Mechanical Ventilation due to procedure Had DVTs diagnosed 2 wks ago P:   Maintain on PRVC overnight, SBT in AM. VAP prevention bundle. SBT in AM. IVC filter via IR.  CARDIOVASCULAR A: Hypertension Diastolic HF P:   Will maintain goal SBP >120, <140. When agitated, is a little higher than this, a low-dose propofol may help control agitation / maintain BP. Will need secondary prevention medications prior to d/c Hold anticoag for at least one month.  RENAL A: Mild renal insuffiency,  P:   Also go contrast today; will avoid further nephrotoxic drugs  GASTROINTESTINAL A: Holding TF 2/2 possible extubatation tomorrow. P:    HEMATOLOGIC A: Recent DVT P:   Hold lovenox for now given recent tPA and secondary hemorrhage.  IVC filter for one month then remove and restart anti-coag.  INFECTIOUS A: No active issues. P:    ENDOCRINE A: DM 2 P:   Moderate SSI ordered  NEUROLOGIC A: Acute L MCA stroke P:   S/p intervention. Defer to primary team for additional management. CT in AM, maintain careful BP control o/n  TODAY'S SUMMARY: 81 y/o man with acute L MCA stroke s/p catheter intervention, hemorrhagic conversion.  Updated wife bedside.  IVC  filter today then SBT in AM.  The patient is critically ill with multiple organ systems failure and requires high complexity decision making for assessment and support, frequent evaluation and titration of therapies, application of advanced monitoring technologies and extensive interpretation of multiple databases.   Critical Care Time devoted to patient care services described in this note is  35  Minutes. This time reflects time of care of this signee Dr Jennet Maduro. This critical care time does not reflect procedure time, or teaching time or supervisory time of PA/NP/Med student/Med Resident etc but could involve care discussion time.  Rush Farmer, M.D. [facility] Pulmonary/Critical Care Medicine. Pager: [phone]. After hours pager: [phone].

## 2014-12-10 NOTE — Progress Notes (Signed)
OT Cancellation Note  Patient Details Name: Paul Dalton MRN: [id number] DOB: [birth year]   Cancelled Treatment:    Reason Eval/Treat Not Completed: Patient not medically ready - Pt on strict bedrest, has femoral sheath, and is intubted.  Will check back tomorrow.   Darlina Rumpf [place], OTR/L [phone]  12/10/2014, 10:51 AM

## 2014-12-11 ENCOUNTER — Inpatient Hospital Stay (HOSPITAL_COMMUNITY): Payer: Medicare Other

## 2014-12-11 ENCOUNTER — Other Ambulatory Visit: Payer: Medicare Other

## 2014-12-11 DIAGNOSIS — I82402 Acute embolism and thrombosis of unspecified deep veins of left lower extremity: Secondary | ICD-10-CM

## 2014-12-11 DIAGNOSIS — G936 Cerebral edema: Secondary | ICD-10-CM

## 2014-12-11 LAB — BLOOD GAS, ARTERIAL
Acid-Base Excess: 0.7 mmol/L (ref 0.0–2.0)
Bicarbonate: 24.7 mEq/L — ABNORMAL HIGH (ref 20.0–24.0)
DRAWN BY: 39898
FIO2: 0.3 %
MECHVT: 550 mL
O2 Saturation: 97.6 %
PCO2 ART: 39 mmHg (ref 35.0–45.0)
PEEP/CPAP: 5 cmH2O
PH ART: 7.418 (ref 7.350–7.450)
PO2 ART: 99.3 mmHg (ref 80.0–100.0)
Patient temperature: 98.6
RATE: 20 resp/min
TCO2: 25.9 mmol/L (ref 0–100)

## 2014-12-11 LAB — BASIC METABOLIC PANEL
ANION GAP: 8 (ref 5–15)
BUN: 14 mg/dL (ref 6–20)
CO2: 26 mmol/L (ref 22–32)
Calcium: 8.4 mg/dL — ABNORMAL LOW (ref 8.9–10.3)
Chloride: 106 mmol/L (ref 101–111)
Creatinine, Ser: 0.88 mg/dL (ref 0.61–1.24)
Glucose, Bld: 170 mg/dL — ABNORMAL HIGH (ref 65–99)
POTASSIUM: 4.1 mmol/L (ref 3.5–5.1)
SODIUM: 140 mmol/L (ref 135–145)

## 2014-12-11 LAB — GLUCOSE, CAPILLARY
GLUCOSE-CAPILLARY: 184 mg/dL — AB (ref 65–99)
Glucose-Capillary: 120 mg/dL — ABNORMAL HIGH (ref 65–99)
Glucose-Capillary: 128 mg/dL — ABNORMAL HIGH (ref 65–99)
Glucose-Capillary: 158 mg/dL — ABNORMAL HIGH (ref 65–99)
Glucose-Capillary: 206 mg/dL — ABNORMAL HIGH (ref 65–99)
Glucose-Capillary: 208 mg/dL — ABNORMAL HIGH (ref 65–99)

## 2014-12-11 LAB — CBC
HCT: 34.1 % — ABNORMAL LOW (ref 39.0–52.0)
Hemoglobin: 11.3 g/dL — ABNORMAL LOW (ref 13.0–17.0)
MCH: 32.5 pg (ref 26.0–34.0)
MCHC: 33.1 g/dL (ref 30.0–36.0)
MCV: 98 fL (ref 78.0–100.0)
Platelets: 122 10*3/uL — ABNORMAL LOW (ref 150–400)
RBC: 3.48 MIL/uL — ABNORMAL LOW (ref 4.22–5.81)
RDW: 15.3 % (ref 11.5–15.5)
WBC: 13.4 10*3/uL — ABNORMAL HIGH (ref 4.0–10.5)

## 2014-12-11 LAB — MAGNESIUM: Magnesium: 1.9 mg/dL (ref 1.7–2.4)

## 2014-12-11 LAB — HEMOGLOBIN A1C
HEMOGLOBIN A1C: 7.8 % — AB (ref 4.8–5.6)
Mean Plasma Glucose: 177 mg/dL

## 2014-12-11 LAB — PHOSPHORUS: Phosphorus: 3 mg/dL (ref 2.5–4.6)

## 2014-12-11 LAB — SODIUM
SODIUM: 144 mmol/L (ref 135–145)
Sodium: 144 mmol/L (ref 135–145)

## 2014-12-11 MED ORDER — ACETAMINOPHEN 160 MG/5ML PO SOLN
650.0000 mg | Freq: Four times a day (QID) | ORAL | Status: DC | PRN
  Administered 2014-12-11 – 2014-12-13 (×2): 650 mg
  Filled 2014-12-11 (×2): qty 20.3

## 2014-12-11 MED ORDER — VITAL HIGH PROTEIN PO LIQD: 1000.0000 mL | ORAL | Status: DC

## 2014-12-11 MED ORDER — MAGNESIUM SULFATE 2 GM/50ML IV SOLN
2.0000 g | Freq: Once | INTRAVENOUS | Status: AC
  Administered 2014-12-11: 2 g via INTRAVENOUS
  Filled 2014-12-11: qty 50

## 2014-12-11 MED ORDER — PRO-STAT SUGAR FREE PO LIQD
60.0000 mL | Freq: Three times a day (TID) | ORAL | Status: DC
  Administered 2014-12-11 – 2014-12-13 (×6): 60 mL
  Filled 2014-12-11 (×8): qty 60

## 2014-12-11 MED ORDER — SODIUM CHLORIDE 3 % IV SOLN
INTRAVENOUS | Status: DC
  Administered 2014-12-11: 75 mL/h via INTRAVENOUS
  Administered 2014-12-12 (×3): 100 mL/h via INTRAVENOUS
  Filled 2014-12-11 (×7): qty 500

## 2014-12-11 MED ORDER — VITAL AF 1.2 CAL PO LIQD
1000.0000 mL | ORAL | Status: DC
  Administered 2014-12-11 – 2014-12-13 (×2): 1000 mL
  Filled 2014-12-11 (×3): qty 1000

## 2014-12-11 MED ORDER — SODIUM CHLORIDE 3 % IV SOLN: INTRAVENOUS | Status: DC

## 2014-12-11 NOTE — Progress Notes (Signed)
PULMONARY  / CRITICAL CARE MEDICINE CONSULTATION   Name: Paul Dalton MRN: [id number] DOB: 18-Oct-1929    ADMISSION DATE:  11/27/2014 CONSULTATION DATE: December 11, 2014  REQUESTING CLINICIAN: Garvin Fila, MD PRIMARY SERVICE: Neurology  CHIEF COMPLAINT:  Sudden onset aphasia  BRIEF PATIENT DESCRIPTION: 81 y/o man with multiple risk factors for stroke on Lovenox for treatment of DVT who presented with left MCA stroke s/p revascularization and catheter directed TPA.  SIGNIFICANT EVENTS / STUDIES:  11/19/2014 - underwent intervention of acute L MCA stroke with 7.5 mg of superselective catheter-directed tPA.  LINES / TUBES: 8.0 ETT - 6/26 R fem Art line - 6/26 L fem arterial sheath - 6/26 Foley - 6/26  CULTURES: none  ANTIBIOTICS: Ancef x1 6/26  HISTORY OF PRESENT ILLNESS:  Paul Dalton is a 81 year old man with a history of CAD, DM, prior TIA, and seizure in March of 2016. He was found to have a DVT within a week of his presentation and was started on lovenox, 100 mg BID. He was brought to the ED on 6/26 after developing sudden onset aphasia and R sided weakness. He had a NIHSS of 22 and a head CT did not demonstrate any hemorrhage. Due to his anticoagulated state, tPA was not given, but he was taken by IR for catheter intervention where he had an angular branch of his L MCA occluded, as well as frontal branch of a superior division of the same. He had complete revascularization of the angular branch, and partial revascularization of the frontal branch. He was brought to the ICU and remain intubated. PCCM was consulted for critical care management.  SUBJECTIVE: Awake and interactive.  VITAL SIGNS: Temp:  [98 F (36.7 C)-99.3 F (37.4 C)] 98 F (36.7 C) (06/28 0713) Pulse Rate:  [70-83] 70 (06/28 0900) Resp:  [15-27] 21 (06/28 0900) BP: (118-156)/(46-84) 147/72 mmHg (06/28 0900) SpO2:  [97 %-100 %] 100 % (06/28 0900) Arterial Line BP: (136-179)/(77-158) 136/92 mmHg (06/27  1300) FiO2 (%):  [30 %-100 %] 30 % (06/28 0751) HEMODYNAMICS:   VENTILATOR SETTINGS: Vent Mode:  [-] CPAP;PSV FiO2 (%):  [30 %-100 %] 30 % Set Rate:  [20 bmp] 20 bmp Vt Set:  [550 mL] 550 mL PEEP:  [5 cmH20] 5 cmH20 Pressure Support:  [5 cmH20] 5 cmH20 Plateau Pressure:  [11 cmH20-13 cmH20] 13 cmH20 INTAKE / OUTPUT: Intake/Output      06/27 0701 - 06/28 0700 06/28 0701 - 06/29 0700   I.V. (mL/kg) 2377.4 (23.1) 150 (1.5)   Total Intake(mL/kg) 2377.4 (23.1) 150 (1.5)   Urine (mL/kg/hr) 1950 (0.8) 200 (0.6)   Blood     Total Output 1950 200   Net +427.4 -50         PHYSICAL EXAMINATION: General:  Elderly man, intubated, follows commands on the left not right. Neuro:  Awake, able to node yes/no appropriately moving left not right ext. HEENT:  [place]/AT, PERRL, EOM-I and MMM Neck: No masses, -JVD. Cardiovascular:  RRR, Nl S1/S2, -M/R/G. Lungs:  CTA bilaterally Abdomen:  Soft, NT, ND and +BS. Musculoskeletal:  No deformities. Skin:  Scattered ecchymoses.  LABS:  CBC  Recent Labs Lab 11/14/2014 1425 11/17/2014 1436 12/10/14 0610 12/11/14 0240  WBC 8.0  --  9.6 13.4*  HGB 12.6* 13.6 11.1* 11.3*  HCT 36.4* 40.0 32.8* 34.1*  PLT 179  --  174 122*   Coag's  Recent Labs Lab 11/29/2014 1425  APTT 38*  INR 1.29   BMET  Recent  Labs Lab 11/28/2014 1425 12/10/2014 1436 12/10/14 0610 12/11/14 0240  NA 138 137 139 140  K 4.4 4.3 4.0 4.1  CL 100* 101 106 106  CO2 27  --  26 26  BUN 26* 28* 19 14  CREATININE 1.23 1.10 0.90 0.88  GLUCOSE 244* 244* 145* 170*   Electrolytes  Recent Labs Lab 12/05/2014 1425 12/10/14 0610 12/11/14 0240  CALCIUM 9.3 8.7* 8.4*  MG  --   --  1.9  PHOS  --   --  3.0   Sepsis Markers No results for input(s): LATICACIDVEN, PROCALCITON, O2SATVEN in the last 168 hours.   ABG  Recent Labs Lab 12/06/2014 1835 12/11/14 0435  PHART 7.379 7.418  PCO2ART 44.4 39.0  PO2ART 214* 99.3   Liver Enzymes  Recent Labs Lab 11/21/2014 1425  AST 60*   ALT 69*  ALKPHOS 207*  BILITOT 0.9  ALBUMIN 3.5   Cardiac Enzymes No results for input(s): TROPONINI, PROBNP in the last 168 hours. Glucose  Recent Labs Lab 12/10/14 1212 12/10/14 1547 12/10/14 1954 12/11/14 0010 12/11/14 0445 12/11/14 0712  GLUCAP 161* 157* 140* 158* 128* 184*   Imaging Ct Head Wo Contrast  12/10/2014   CLINICAL DATA:  Left MCA stroke.  Hemorrhage.  EXAM: CT HEAD WITHOUT CONTRAST  TECHNIQUE: Contiguous axial images were obtained from the base of the skull through the vertex without intravenous contrast.  COMPARISON:  A earlier this day at 0448 hr  FINDINGS: Intraparenchymal hemorrhage in the left temporoparietal lobe, minimally increased from prior exam measuring approximately 5.0 x 3.5 cm, previously 5.0 x 2.7 cm. Adjacent small satellite hemorrhages muscle slightly larger. Associated loss of gray-white differentiation and progressive hypodensity in the left occipital, temporoparietal and frontal infarcts, similar in distribution to prior exam. Left-to-right midline shift of 3 mm, previously 2 mm. Effacement of the left lateral ventricle is unchanged. No developing hydrocephalus. The basilar cisterns are patent. No convincing extra-axial hematoma. No new hemorrhage. Paranasal sinuses and mastoid air cells remain clear.  IMPRESSION: 1. Minimal increased size of the left temporal parietal hematoma from prior exam, currently 5.0 x 3.5 cm, previously 5.0 x 2.7 cm. Minimal increased size of the small adjacent satellite hematomas. 2. Grossly unchanged midline shift of 3 mm, previously 2 mm. 3. Infarcts in the left frontal, temporal, parietal occipital lobes, with expected evolution.   Electronically Signed   By: Jeb Levering M.D.   On: 12/10/2014 23:03   Ct Head Wo Contrast  12/10/2014   CLINICAL DATA:  Follow-up evaluation. History of hypertension, headache, aortic coarctation.  EXAM: CT HEAD WITHOUT CONTRAST  TECHNIQUE: Contiguous axial images were obtained from the base  of the skull through the vertex without intravenous contrast.  COMPARISON:  CT head December 09, 2014  FINDINGS: New LEFT temporal parietal intraparenchymal 5 x 2.7 cm hematoma with small satellite parenchymal hemorrhages. Associated loss of the gray-white matter differentiation. In addition, loss of the mesial LEFT occipital lobe and LEFT frontal lobe gray-white matter differentiation with mild sulcal effacement. Dense apparent LEFT cortical veins without convincing evidence of extra-axial blood products. 2 mm LEFT-to-RIGHT midline shift. Mild LEFT lateral ventricle effacement without hydrocephalus or entrapment.  Basal cisterns are patent. Moderate calcific atherosclerosis of the carotid siphons. Status post bilateral ocular lens implants. Paranasal sinuses and mastoid air cells are well aerated. No skull fracture.  IMPRESSION: 5 x 2.7 cm acute LEFT temporal parietal hematoma with small satellite hematomas.  Acute LEFT frontal, LEFT temporal, LEFT parietal and LEFT occipital lobe infarcts  spanning LEFT middle cerebral and LEFT posterior cerebral artery territories.  2 mm LEFT to RIGHT midline shift without hydrocephalus or ventricular entrapment.  Acute findings discussed with and reconfirmed by Barry Dienes on 12/10/2014 at 5:24 am.   Electronically Signed   By: Elon Alas M.D.   On: 12/10/2014 05:25   Ct Head Wo Contrast  11/29/2014   CLINICAL DATA:  Stroke.  Post neurointerventional revascularization.  EXAM: CT HEAD WITHOUT CONTRAST  TECHNIQUE: Contiguous axial images were obtained from the base of the skull through the vertex without intravenous contrast.  COMPARISON:  Noncontrast head CT earlier this day at 1424 hr  FINDINGS: No interval hemorrhage. Contrast within the intra cerebral vasculature from recent procedure. There are a few scattered focal areas of hyperdensity throughout the left cerebral hemisphere, for example occipital lobe image 11, parietal lobe image 23/24. This may be related to  venous stasis or intravascular calcification. No cerebral edema. There is diffuse mucosal thickening of the paranasal sinuses. The exam is otherwise unchanged.  IMPRESSION: 1. No postprocedural intracranial hemorrhage. 2. Scattered hypodense foci in the left cerebral hemisphere, may reflect intravascular contrast related to stasis versus calcifications, possibly embolic.   Electronically Signed   By: Jeb Levering M.D.   On: 11/14/2014 18:47   Ct Head (brain) Wo Contrast  11/27/2014   CLINICAL DATA:  Code stroke.  Right-sided weakness.  EXAM: CT HEAD WITHOUT CONTRAST  TECHNIQUE: Contiguous axial images were obtained from the base of the skull through the vertex without intravenous contrast.  COMPARISON:  08/23/2014; 05/26/2013  FINDINGS: Similar findings of advanced atrophy with diffuse sulcal prominence. Scattered periventricular hypodensities compatible microvascular ischemic disease. No CT evidence of acute large territory infarct. No intraparenchymal or extra-axial mass or hemorrhage. Unchanged size and configuration of the ventricles and basilar cisterns. No midline shift. Intracranial atherosclerosis. Limited visualization the paranasal sinuses and mastoid air cells is normal. No air-fluid levels. Regional soft tissues appear normal. Post bilateral cataract surgery. No displaced calvarial fracture.  IMPRESSION: Similar findings of atrophy and microvascular ischemic disease without acute intracranial process.  Critical Value/emergent results were called by telephone at the time of interpretation on 12/04/2014 at 2:32 pm to Dr. Aram Beecham, who verbally acknowledged these results.   Electronically Signed   By: Sandi Mariscal M.D.   On: 12/02/2014 14:35   Ir Ivc Filter Plmt / S&i /img Guid/mod Sed  12/10/2014   CLINICAL DATA:  81 year old gentleman with a history of cerebral vascular accident, with lower extremity DVT. He is not a candidate for anti coagulation medication.  An IVC filter is indicated for  placement.  EXAM: ULTRASOUND GUIDANCE FOR VASCULAR ACCESS  IVC CATHETERIZATION AND VENOGRAM  IVC FILTER INSERTION  Date:  12/04/2014; 6/27/20166/26/2016 5:55 pm; 12/10/2014 3:25 pm  FLUOROSCOPY TIME:  1 minutes  MEDICATIONS AND MEDICAL HISTORY: No sedation  ANESTHESIA/SEDATION: None  CONTRAST:  62mL OMNIPAQUE IOHEXOL 300 MG/ML SOLN; 9mL OMNIPAQUE IOHEXOL 300 MG/ML SOLN  COMPLICATIONS: None  PROCEDURE: Informed consent was obtained from the patient following explanation of the procedure, risks, benefits and alternatives. The patient understands, agrees and consents for the procedure. All questions were addressed. A time out was performed.  Maximal barrier sterile technique utilized including caps, mask, sterile gowns, sterile gloves, large sterile drape, hand hygiene, and betadine prep.  Ultrasound survey was performed of the right jugular region with images stored and sent to PACs.  Under sterile condition and local anesthesia, right internal jugular venous access was performed with ultrasound. Over a guide  wire, the IVC filter delivery sheath and inner dilator were advanced into the IVC just above the IVC bifurcation. Contrast injection was performed for an IVC venogram.  A jugular approach Denali retrievable filter was then placed into the IVC, below the bilateral renal veins, and below the widest segment of the IVC.  FINDINGS: IVC VENOGRAM: The IVC is patent. No evidence of thrombus, stenosis, or occlusion. No variant venous anatomy. The renal veins are identified at L1-L2.  The diameter of the IVC just below the renal veins measured greater than the largest diameter compatible with indication for use for this particular filter, at least 29 mm.  The segment of IVC with a diameter compatible with this filter was at the level of L4, above the iliac vein inflow.  IVC FILTER INSERTION: Through the delivery sheath, the [facility] retrieval IVC filter was deployed in the infrarenal IVC at the L4 level below the renal veins  and above the IVC bifurcation. Contrast injection confirmed position. There is good apposition of the filter against the IVC.  The delivery sheath was removed and hemostasis was obtained with compression for 5 minutes. The patient tolerated the procedure well. No immediate complications.  IMPRESSION: Status post IV C filter placement via right internal jugular approach, below the renal veins at the most narrow segment of the infrarenal IVC.  This IVC filter is potentially retrievable. The patient may be assessed for filter retrieval by Interventional Radiology in approximately 8-12 weeks. Further recommendations regarding filter retrieval, continued surveillance or declaration of device permanence, can be made at that time.  Signed,  Dulcy Fanny. Earleen Newport, DO  [facility]  [facility]   Electronically Signed   By: Corrie Mckusick D.O.   On: 12/10/2014 17:52   Dg Chest Port 1 View  12/11/2014   CLINICAL DATA:  Check endotracheal tube placement  EXAM: PORTABLE CHEST - 1 VIEW  COMPARISON:  12/04/2014  FINDINGS: Cardiac shadow remains enlarged. A pacing device is again seen. Endotracheal tube is noted 7.7 cm above the carina. The lungs are well aerated bilaterally. Increasing left basilar infiltrate is noted.  IMPRESSION: Endotracheal tube in satisfactory position.  Increasing left basilar infiltrate.   Electronically Signed   By: Inez Catalina M.D.   On: 12/11/2014 07:13   Dg Chest Port 1 View  12/10/2014   CLINICAL DATA:  Stroke.  EXAM: PORTABLE CHEST - 1 VIEW  COMPARISON:  12/06/2014  FINDINGS: Endotracheal tube is 5.3 cm from the carina. There are multiple overlying monitoring devices, an enteric tube is not definitively seen. Dual lead right-sided pacemaker remains in place. Cardiomediastinal contours are unchanged, heart at the upper limits of normal in size. No consolidation, pleural effusion or pneumothorax. No pulmonary edema.  IMPRESSION: 1. Endotracheal tube 5.3 cm  from the carina. 2. Clear lungs.   Electronically Signed   By: Jeb Levering M.D.   On: 12/10/2014 00:39   Ir Percutaneous Art Thrombectomy/infusion Intracranial Inc Diag Angio  12/10/2014   CLINICAL DATA:  81 year old gentleman with a history of cerebral vascular accident, with lower extremity DVT. He is not a candidate for anti coagulation medication.  An IVC filter is indicated for placement.  EXAM: ULTRASOUND GUIDANCE FOR VASCULAR ACCESS  IVC CATHETERIZATION AND VENOGRAM  IVC FILTER INSERTION  Date:  12/03/2014; 6/27/20166/26/2016 5:55 pm; 12/10/2014 3:25 pm  FLUOROSCOPY TIME:  1 minutes  MEDICATIONS AND MEDICAL HISTORY: No sedation  ANESTHESIA/SEDATION: None  CONTRAST:  62mL OMNIPAQUE IOHEXOL 300 MG/ML SOLN; 17mL OMNIPAQUE IOHEXOL  300 MG/ML SOLN  COMPLICATIONS: None  PROCEDURE: Informed consent was obtained from the patient following explanation of the procedure, risks, benefits and alternatives. The patient understands, agrees and consents for the procedure. All questions were addressed. A time out was performed.  Maximal barrier sterile technique utilized including caps, mask, sterile gowns, sterile gloves, large sterile drape, hand hygiene, and betadine prep.  Ultrasound survey was performed of the right jugular region with images stored and sent to PACs.  Under sterile condition and local anesthesia, right internal jugular venous access was performed with ultrasound. Over a guide wire, the IVC filter delivery sheath and inner dilator were advanced into the IVC just above the IVC bifurcation. Contrast injection was performed for an IVC venogram.  A jugular approach Denali retrievable filter was then placed into the IVC, below the bilateral renal veins, and below the widest segment of the IVC.  FINDINGS: IVC VENOGRAM: The IVC is patent. No evidence of thrombus, stenosis, or occlusion. No variant venous anatomy. The renal veins are identified at L1-L2.  The diameter of the IVC just below the renal veins  measured greater than the largest diameter compatible with indication for use for this particular filter, at least 29 mm.  The segment of IVC with a diameter compatible with this filter was at the level of L4, above the iliac vein inflow.  IVC FILTER INSERTION: Through the delivery sheath, the [facility] retrieval IVC filter was deployed in the infrarenal IVC at the L4 level below the renal veins and above the IVC bifurcation. Contrast injection confirmed position. There is good apposition of the filter against the IVC.  The delivery sheath was removed and hemostasis was obtained with compression for 5 minutes. The patient tolerated the procedure well. No immediate complications.  IMPRESSION: Status post IV C filter placement via right internal jugular approach, below the renal veins at the most narrow segment of the infrarenal IVC.  This IVC filter is potentially retrievable. The patient may be assessed for filter retrieval by Interventional Radiology in approximately 8-12 weeks. Further recommendations regarding filter retrieval, continued surveillance or declaration of device permanence, can be made at that time.  Signed,  Dulcy Fanny. Earleen Newport, DO  [facility]  [facility]   Electronically Signed   By: Corrie Mckusick D.O.   On: 12/10/2014 17:52    EKG: V-paced rhythm. CXR: tube high  ASSESSMENT / PLAN:  PULMONARY A: Need for Mechanical Ventilation due to procedure Had DVTs diagnosed 2 wks ago P:   PS trials but no extubation until after 3% is complete. VAP prevention bundle. IVC filter in place.  CARDIOVASCULAR A: Hypertension Diastolic HF P:   Will maintain goal SBP >120, <140. Will need secondary prevention medications prior to d/c Hold anticoag for at least one month.  RENAL A: Mild renal insuffiency,  P:   Also got contrast today; will avoid further nephrotoxic drugs BMET in AM. Replace electrolytes as indicated.  GASTROINTESTINAL A: No  active issues. P:   Consult nutrition for TF. PPI.  HEMATOLOGIC A: Recent DVT P:   Hold lovenox for now given recent tPA and secondary hemorrhage.  IVC filter for one month then remove and restart anti-coag.  INFECTIOUS A: No active issues. P:   Monitor WBC and fever curve.  ENDOCRINE A: DM 2 P:   Moderate SSI ordered  NEUROLOGIC A: Acute L MCA stroke P:   S/p intervention. Defer to primary team for additional management. CT in AM, maintain careful BP control  o/n  The patient is critically ill with multiple organ systems failure and requires high complexity decision making for assessment and support, frequent evaluation and titration of therapies, application of advanced monitoring technologies and extensive interpretation of multiple databases.   Critical Care Time devoted to patient care services described in this note is  35  Minutes. This time reflects time of care of this signee Dr Jennet Maduro. This critical care time does not reflect procedure time, or teaching time or supervisory time of PA/NP/Med student/Med Resident etc but could involve care discussion time.  Rush Farmer, M.D. [facility] Pulmonary/Critical Care Medicine. Pager: [phone]. After hours pager: [phone].

## 2014-12-11 NOTE — Progress Notes (Signed)
OT Cancellation Note  Patient Details Name: Paul Dalton MRN: [id number] DOB: 09/03/1929   Cancelled Treatment:    Reason Eval/Treat Not Completed: Patient not medically ready - Pt on strict bedrest and intubated.  Will try back.  Darlina Rumpf [place], OTR/L [phone]  12/11/2014, 10:50 AM

## 2014-12-11 NOTE — Progress Notes (Addendum)
STROKE TEAM PROGRESS NOTE   HISTORY Paul Dalton is an 81 y.o. male with multiple medical including HTN, hyperlipidemia, atrial fibrillation off anticoagulation, CAD, s/p pacemaker placement, TIA's, AAA, DVT on fullk dose Lovenox, OSA, brought in via EMS for evaluation of sudden onset expressive aphasia. Wife is at the bedside. He was home today 12/09/3014, ate lunch, and subsequently developed sudden onset of inability to speak and right sided weakness at 1300 (LKW). EMS was summoned and patient brought to the ED where he was alert and awake with NIHSS 22. CT brain showed no acute abnormality. Patient administered his dose of Lovenox this morning. NIHSS: 22. Patient was not administered TPA secondary to no intravenous thrombolysis was given as patient received Lovenox few hours before presentation. Taken to the interventional suite for angiogram and had Complete revascularization of angular branch ,and partial revascularization of the frontal branch with 7.5 mg of superselective IATPA. Marland Kitchen He was admitted to the neuro ICU for further evaluation and treatment.   SUBJECTIVE (INTERVAL HISTORY) His wife and son are at the bedside.  He remains intubated this am. Patient sleepier this am. CT scan showed stable hemorrhage with increased cytotoxic edema and midline shift  he had IVC filter placed yesterday  OBJECTIVE Temp:  [98 F (36.7 C)-99.3 F (37.4 C)] 98 F (36.7 C) (06/28 0713) Pulse Rate:  [70-83] 74 (06/28 0800) Cardiac Rhythm:  [-] Ventricular paced (06/28 0800) Resp:  [15-27] 20 (06/28 0800) BP: (118-156)/(46-84) 148/69 mmHg (06/28 0800) SpO2:  [97 %-100 %] 100 % (06/28 0800) Arterial Line BP: (136-179)/(75-158) 136/92 mmHg (06/27 1300) FiO2 (%):  [30 %-100 %] 30 % (06/28 0751)   Recent Labs Lab 12/10/14 1547 12/10/14 1954 12/11/14 0010 12/11/14 0445 12/11/14 0712  GLUCAP 157* 140* 158* 128* 184*    Recent Labs Lab 12/05/2014 1425 11/16/2014 1436 12/10/14 0610 12/11/14 0240  NA  138 137 139 140  K 4.4 4.3 4.0 4.1  CL 100* 101 106 106  CO2 27  --  26 26  GLUCOSE 244* 244* 145* 170*  BUN 26* 28* 19 14  CREATININE 1.23 1.10 0.90 0.88  CALCIUM 9.3  --  8.7* 8.4*  MG  --   --   --  1.9  PHOS  --   --   --  3.0    Recent Labs Lab 11/28/2014 1425  AST 60*  ALT 69*  ALKPHOS 207*  BILITOT 0.9  PROT 6.7  ALBUMIN 3.5    Recent Labs Lab 12/08/2014 1425 11/25/2014 1436 12/10/14 0610 12/11/14 0240  WBC 8.0  --  9.6 13.4*  NEUTROABS 5.6  --  7.8*  --   HGB 12.6* 13.6 11.1* 11.3*  HCT 36.4* 40.0 32.8* 34.1*  MCV 95.5  --  96.2 98.0  PLT 179  --  174 122*   No results for input(s): CKTOTAL, CKMB, CKMBINDEX, TROPONINI in the last 168 hours.  Recent Labs  11/25/2014 1425  LABPROT 16.2*  INR 1.29   No results for input(s): COLORURINE, LABSPEC, PHURINE, GLUCOSEU, HGBUR, BILIRUBINUR, KETONESUR, PROTEINUR, UROBILINOGEN, NITRITE, LEUKOCYTESUR in the last 72 hours.  Invalid input(s): APPERANCEUR     Component Value Date/Time   CHOL 124 12/10/2014 0610   TRIG 156* 12/10/2014 0610   HDL 30* 12/10/2014 0610   CHOLHDL 4.1 12/10/2014 0610   VLDL 31 12/10/2014 0610   LDLCALC 63 12/10/2014 0610   Lab Results  Component Value Date   HGBA1C 7.8* 12/10/2014   No results found for: LABOPIA, COCAINSCRNUR, LABBENZ,  AMPHETMU, THCU, LABBARB  No results for input(s): ETH in the last 168 hours.  Ct Head Wo Contrast  12/10/2014   CLINICAL DATA:  Left MCA stroke.  Hemorrhage.  EXAM: CT HEAD WITHOUT CONTRAST  TECHNIQUE: Contiguous axial images were obtained from the base of the skull through the vertex without intravenous contrast.  COMPARISON:  A earlier this day at 0448 hr  FINDINGS: Intraparenchymal hemorrhage in the left temporoparietal lobe, minimally increased from prior exam measuring approximately 5.0 x 3.5 cm, previously 5.0 x 2.7 cm. Adjacent small satellite hemorrhages muscle slightly larger. Associated loss of gray-white differentiation and progressive hypodensity in  the left occipital, temporoparietal and frontal infarcts, similar in distribution to prior exam. Left-to-right midline shift of 3 mm, previously 2 mm. Effacement of the left lateral ventricle is unchanged. No developing hydrocephalus. The basilar cisterns are patent. No convincing extra-axial hematoma. No new hemorrhage. Paranasal sinuses and mastoid air cells remain clear.  IMPRESSION: 1. Minimal increased size of the left temporal parietal hematoma from prior exam, currently 5.0 x 3.5 cm, previously 5.0 x 2.7 cm. Minimal increased size of the small adjacent satellite hematomas. 2. Grossly unchanged midline shift of 3 mm, previously 2 mm. 3. Infarcts in the left frontal, temporal, parietal occipital lobes, with expected evolution.   Electronically Signed   By: Paul Dalton M.D.   On: 12/10/2014 23:03   Ct Head Wo Contrast  12/10/2014   CLINICAL DATA:  Follow-up evaluation. History of hypertension, headache, aortic coarctation.  EXAM: CT HEAD WITHOUT CONTRAST  TECHNIQUE: Contiguous axial images were obtained from the base of the skull through the vertex without intravenous contrast.  COMPARISON:  CT head December 09, 2014  FINDINGS: New LEFT temporal parietal intraparenchymal 5 x 2.7 cm hematoma with small satellite parenchymal hemorrhages. Associated loss of the gray-white matter differentiation. In addition, loss of the mesial LEFT occipital lobe and LEFT frontal lobe gray-white matter differentiation with mild sulcal effacement. Dense apparent LEFT cortical veins without convincing evidence of extra-axial blood products. 2 mm LEFT-to-RIGHT midline shift. Mild LEFT lateral ventricle effacement without hydrocephalus or entrapment.  Basal cisterns are patent. Moderate calcific atherosclerosis of the carotid siphons. Status post bilateral ocular lens implants. Paranasal sinuses and mastoid air cells are well aerated. No skull fracture.  IMPRESSION: 5 x 2.7 cm acute LEFT temporal parietal hematoma with small  satellite hematomas.  Acute LEFT frontal, LEFT temporal, LEFT parietal and LEFT occipital lobe infarcts spanning LEFT middle cerebral and LEFT posterior cerebral artery territories.  2 mm LEFT to RIGHT midline shift without hydrocephalus or ventricular entrapment.  Acute findings discussed with and reconfirmed by Paul Dalton on 12/10/2014 at 5:24 am.   Electronically Signed   By: Paul Dalton M.D.   On: 12/10/2014 05:25   Ct Head Wo Contrast  11/15/2014   CLINICAL DATA:  Stroke.  Post neurointerventional revascularization.  EXAM: CT HEAD WITHOUT CONTRAST  TECHNIQUE: Contiguous axial images were obtained from the base of the skull through the vertex without intravenous contrast.  COMPARISON:  Noncontrast head CT earlier this day at 1424 hr  FINDINGS: No interval hemorrhage. Contrast within the intra cerebral vasculature from recent procedure. There are a few scattered focal areas of hyperdensity throughout the left cerebral hemisphere, for example occipital lobe image 11, parietal lobe image 23/24. This may be related to venous stasis or intravascular calcification. No cerebral edema. There is diffuse mucosal thickening of the paranasal sinuses. The exam is otherwise unchanged.  IMPRESSION: 1. No postprocedural intracranial hemorrhage. 2.  Scattered hypodense foci in the left cerebral hemisphere, may reflect intravascular contrast related to stasis versus calcifications, possibly embolic.   Electronically Signed   By: Paul Dalton M.D.   On: 11/27/2014 18:47   Ct Head (brain) Wo Contrast  12/10/2014   CLINICAL DATA:  Code stroke.  Right-sided weakness.  EXAM: CT HEAD WITHOUT CONTRAST  TECHNIQUE: Contiguous axial images were obtained from the base of the skull through the vertex without intravenous contrast.  COMPARISON:  08/23/2014; 05/26/2013  FINDINGS: Similar findings of advanced atrophy with diffuse sulcal prominence. Scattered periventricular hypodensities compatible microvascular ischemic  disease. No CT evidence of acute large territory infarct. No intraparenchymal or extra-axial mass or hemorrhage. Unchanged size and configuration of the ventricles and basilar cisterns. No midline shift. Intracranial atherosclerosis. Limited visualization the paranasal sinuses and mastoid air cells is normal. No air-fluid levels. Regional soft tissues appear normal. Post bilateral cataract surgery. No displaced calvarial fracture.  IMPRESSION: Similar findings of atrophy and microvascular ischemic disease without acute intracranial process.  Critical Value/emergent results were called by telephone at the time of interpretation on 11/25/2014 at 2:32 pm to Dr. Aram Beecham, who verbally acknowledged these results.   Electronically Signed   By: Sandi Mariscal M.D.   On: 12/05/2014 14:35   Ir Ivc Filter Plmt / S&i /img Guid/mod Sed  12/10/2014   CLINICAL DATA:  81 year old gentleman with a history of cerebral vascular accident, with lower extremity DVT. He is not a candidate for anti coagulation medication.  An IVC filter is indicated for placement.  EXAM: ULTRASOUND GUIDANCE FOR VASCULAR ACCESS  IVC CATHETERIZATION AND VENOGRAM  IVC FILTER INSERTION  Date:  12/10/2014; 6/27/20166/26/2016 5:55 pm; 12/10/2014 3:25 pm  FLUOROSCOPY TIME:  1 minutes  MEDICATIONS AND MEDICAL HISTORY: No sedation  ANESTHESIA/SEDATION: None  CONTRAST:  62mL OMNIPAQUE IOHEXOL 300 MG/ML SOLN; 41mL OMNIPAQUE IOHEXOL 300 MG/ML SOLN  COMPLICATIONS: None  PROCEDURE: Informed consent was obtained from the patient following explanation of the procedure, risks, benefits and alternatives. The patient understands, agrees and consents for the procedure. All questions were addressed. A time out was performed.  Maximal barrier sterile technique utilized including caps, mask, sterile gowns, sterile gloves, large sterile drape, hand hygiene, and betadine prep.  Ultrasound survey was performed of the right jugular region with images stored and sent to PACs.  Under  sterile condition and local anesthesia, right internal jugular venous access was performed with ultrasound. Over a guide wire, the IVC filter delivery sheath and inner dilator were advanced into the IVC just above the IVC bifurcation. Contrast injection was performed for an IVC venogram.  A jugular approach Denali retrievable filter was then placed into the IVC, below the bilateral renal veins, and below the widest segment of the IVC.  FINDINGS: IVC VENOGRAM: The IVC is patent. No evidence of thrombus, stenosis, or occlusion. No variant venous anatomy. The renal veins are identified at L1-L2.  The diameter of the IVC just below the renal veins measured greater than the largest diameter compatible with indication for use for this particular filter, at least 29 mm.  The segment of IVC with a diameter compatible with this filter was at the level of L4, above the iliac vein inflow.  IVC FILTER INSERTION: Through the delivery sheath, the [facility] retrieval IVC filter was deployed in the infrarenal IVC at the L4 level below the renal veins and above the IVC bifurcation. Contrast injection confirmed position. There is good apposition of the filter against the IVC.  The delivery sheath  was removed and hemostasis was obtained with compression for 5 minutes. The patient tolerated the procedure well. No immediate complications.  IMPRESSION: Status post IV C filter placement via right internal jugular approach, below the renal veins at the most narrow segment of the infrarenal IVC.  This IVC filter is potentially retrievable. The patient may be assessed for filter retrieval by Interventional Radiology in approximately 8-12 weeks. Further recommendations regarding filter retrieval, continued surveillance or declaration of device permanence, can be made at that time.  Signed,  Paul Fanny. Earleen Newport, DO  [facility]  [facility]   Electronically Signed   By: Paul Dalton D.O.   On:  12/10/2014 17:52   Dg Chest Port 1 View  12/11/2014   CLINICAL DATA:  Check endotracheal tube placement  EXAM: PORTABLE CHEST - 1 VIEW  COMPARISON:  12/10/2014  FINDINGS: Cardiac shadow remains enlarged. A pacing device is again seen. Endotracheal tube is noted 7.7 cm above the carina. The lungs are well aerated bilaterally. Increasing left basilar infiltrate is noted.  IMPRESSION: Endotracheal tube in satisfactory position.  Increasing left basilar infiltrate.   Electronically Signed   By: Paul Dalton M.D.   On: 12/11/2014 07:13   Dg Chest Port 1 View  12/10/2014   CLINICAL DATA:  Stroke.  EXAM: PORTABLE CHEST - 1 VIEW  COMPARISON:  12/06/2014  FINDINGS: Endotracheal tube is 5.3 cm from the carina. There are multiple overlying monitoring devices, an enteric tube is not definitively seen. Dual lead right-sided pacemaker remains in place. Cardiomediastinal contours are unchanged, heart at the upper limits of normal in size. No consolidation, pleural effusion or pneumothorax. No pulmonary edema.  IMPRESSION: 1. Endotracheal tube 5.3 cm from the carina. 2. Clear lungs.   Electronically Signed   By: Paul Dalton M.D.   On: 12/10/2014 00:39   Ir Percutaneous Art Thrombectomy/infusion Intracranial Inc Diag Angio  12/10/2014   CLINICAL DATA:  80 year old gentleman with a history of cerebral vascular accident, with lower extremity DVT. He is not a candidate for anti coagulation medication.  An IVC filter is indicated for placement.  EXAM: ULTRASOUND GUIDANCE FOR VASCULAR ACCESS  IVC CATHETERIZATION AND VENOGRAM  IVC FILTER INSERTION  Date:  11/24/2014; 6/27/20166/26/2016 5:55 pm; 12/10/2014 3:25 pm  FLUOROSCOPY TIME:  1 minutes  MEDICATIONS AND MEDICAL HISTORY: No sedation  ANESTHESIA/SEDATION: None  CONTRAST:  72mL OMNIPAQUE IOHEXOL 300 MG/ML SOLN; 59mL OMNIPAQUE IOHEXOL 300 MG/ML SOLN  COMPLICATIONS: None  PROCEDURE: Informed consent was obtained from the patient following explanation of the procedure, risks,  benefits and alternatives. The patient understands, agrees and consents for the procedure. All questions were addressed. A time out was performed.  Maximal barrier sterile technique utilized including caps, mask, sterile gowns, sterile gloves, large sterile drape, hand hygiene, and betadine prep.  Ultrasound survey was performed of the right jugular region with images stored and sent to PACs.  Under sterile condition and local anesthesia, right internal jugular venous access was performed with ultrasound. Over a guide wire, the IVC filter delivery sheath and inner dilator were advanced into the IVC just above the IVC bifurcation. Contrast injection was performed for an IVC venogram.  A jugular approach Denali retrievable filter was then placed into the IVC, below the bilateral renal veins, and below the widest segment of the IVC.  FINDINGS: IVC VENOGRAM: The IVC is patent. No evidence of thrombus, stenosis, or occlusion. No variant venous anatomy. The renal veins are identified at L1-L2.  The diameter of  the IVC just below the renal veins measured greater than the largest diameter compatible with indication for use for this particular filter, at least 29 mm.  The segment of IVC with a diameter compatible with this filter was at the level of L4, above the iliac vein inflow.  IVC FILTER INSERTION: Through the delivery sheath, the [facility] retrieval IVC filter was deployed in the infrarenal IVC at the L4 level below the renal veins and above the IVC bifurcation. Contrast injection confirmed position. There is good apposition of the filter against the IVC.  The delivery sheath was removed and hemostasis was obtained with compression for 5 minutes. The patient tolerated the procedure well. No immediate complications.  IMPRESSION: Status post IV C filter placement via right internal jugular approach, below the renal veins at the most narrow segment of the infrarenal IVC.  This IVC filter is potentially retrievable. The  patient may be assessed for filter retrieval by Interventional Radiology in approximately 8-12 weeks. Further recommendations regarding filter retrieval, continued surveillance or declaration of device permanence, can be made at that time.  Signed,  Paul Fanny. Earleen Newport, DO  [facility]  [facility]   Electronically Signed   By: Paul Dalton D.O.   On: 12/10/2014 17:52   Cerebral Angiogram 1occluded prominent angular branch of LT MCA andfrontal branch of superior division. Complete revascularization of angular branch ,and partial revascularization of the frontal branch with 7.5 mg of superselective IATPA  2D Echocardiogram   - Left ventricle: The cavity size was normal. Systolic function wasnormal. The estimated ejection fraction was in the range of 55%to 60%. Wall motion was normal; there were no regional wallmotion abnormalities. - Aortic valve: There was moderate stenosis. There was mildregurgitation. Valve area (VTI): 1.28 cm^2. Valve area (Vmax):1.19 cm^2. Valve area (Vmean): 1.24 cm^2. - Mitral valve: There was mild regurgitation. - Left atrium: The atrium was mildly dilated. - Right atrium: The atrium was moderately to severely dilated. - Tricuspid valve: There was moderate regurgitation. - Pulmonary arteries: PA peak pressure: 55 mm Hg (S).   PHYSICAL EXAM Elderly caucasian male not in distress. . Afebrile. Head is nontraumatic. Neck is supple without bruit.    Cardiac exam no murmur or gallop. Lungs are clear to auscultation. Distal pulses are well felt. He has right groin arterial sheath Neurological Exam : Patient is intubated. stuporose and barely arousable. Pupils are 4 mm equal reactive. Fundi were not visualized. Does not follow commands.. Right lower facial weakness. Tongue is midline. Extraocular movements are full range without nystagmus or restriction. Right hemiparesis but unable to move her right hand and lower extremity at all.  Normal antigravity movements on the left side. Diminished sensation on the right compared to the left. Right plantar upgoing left downgoing. Gait was not tested.    ASSESSMENT/PLAN [name] is a 81 y.o. male with history of CAD, DM, prior TIA, and seizure in March of 2016, found to have a DVT within 1 week and started on lovenox 100 mg BID presenting with sudden onset of aphasia. He did not receive IV t-PA due to being on full dose lovenox, but was taken to neurointervention for angio, s/p complete revascularization of L MCA angular branch and partial revascularization of the frontal branch with 7.5 mg of superselective IA TPA.  Stroke:  Left MCA and PCA territory infarcts s/p partial revascularization w/ neurointervenion now with L temporal parietal and satellite hematomas, infarct likely embolic secondary to unknown etiology. Hematoma secondary  to IA, now symptomatic. tPA/intervention  Resultant  VDRF, R hemiparesis  Cerebral angio occluded prominent angular branch of LT MCA  CT  Post intervention 5 x 2.7 cm acute LEFT temporal parietal hematoma with small satellite hematomas.  Acute LEFT frontal, LEFT temporal, LEFT parietal and LEFT occipital lobe infarcts spanning LEFT middle cerebral and LEFT posterior cerebral artery territories.  Neuro worsening at 48 hours with CT showing increased cytotoxic cerebral edema, increased hemorrhage  Place central line  Start 3% saline to help prevent increasing cerebral edema  2D Echo  No source of embolus   LDL 63  HgbA1c 6.8 in March 2016  SCDs ordered for VTE prophylaxis Diet NPO time specified  full dose lovenox prior to admission for DVT, now on no antithrombotic given post intervention hemorrhage  Dr. Leonie Man discussed diagnosis, prognosis,  treatment options and plan of care with wife and son given neuro worsening and critical condition Continue ICU level care.    Therapy recommendations:  pending   Disposition:  pending    Respiratory Failure   Intubated for neuro intervention  CCM managing vent  Hold extubation given increased cerebral edema  DVT  Recent dx  On full dose lovenox  Filter placed 6/27 to prevent PE  Essential Hypertension Chronic Diastolic HF  Home meds:   Diltiazem, cardura, lasix, metoprolol  Stable  Hyperlipidemia  Home meds:  lipitor 40   LDL 63, goal < 70  Resume statin none able to swallow and continue at discharge  Diabetes  HgbA1c 6.21 August 2014, goal < 7.0  Controlled  Other Stroke Risk Factors  Advanced age  Former Cigarette smoker, quit smoking years ago   ETOH use  Hx stroke/TIA - Hx TIA 1.5 yrs ago  Family hx stroke (father)  Coronary artery disease, MI  Obstructive sleep apnea  Other Active Problems  Mild renal insufficiency 0.9  Severe malnutrition, Body mass index is 27.6 kg/(m^2).   Hospital day # [place] for Pager information 12/11/2014 9:25 AM  I have personally examined this patient, reviewed notes, independently viewed imaging studies, participated in medical decision making and plan of care. I have made any additions or clarifications directly to the above note. Agree with note above. The patient's neurological exam was declined today with decreased mental status and increased right hemiparesis likely from increasing cytotoxic edema due to hemorrhagic left MCA infarct. I had a long discussion with the patient's son and wife at the bedside and discuss treatment options including using hypertonic saline to treat edema. There are in agreement and would like to continue aggressive care for the next few days but would like to reassess his situation prior to extubation to make decisions about long-term care  This patient is critically ill and at significant risk of neurological worsening, death and care requires constant monitoring of vital signs, hemodynamics,respiratory and cardiac  monitoring, extensive review of multiple databases, frequent neurological assessment, discussion with family, other specialists and medical decision making of high complexity.I have made any additions or clarifications directly to the above note.This critical care time does not reflect procedure time, or teaching time or supervisory time of PA/NP/Med Resident etc but could involve care discussion time.  I spent 30 minutes of neurocritical care time  in the care of  this patient.   Antony Contras, MD Medical Director [facility] Pager: [phone] 12/11/2014 12:54 PM   To contact Stroke Continuity provider, please refer to [email or web address] After hours,  contact General Neurology Mindi used to going to classes to training?

## 2014-12-11 NOTE — Progress Notes (Signed)
Initial Nutrition Assessment  DOCUMENTATION CODES:  Severe malnutrition in context of chronic illness  INTERVENTION:  Prostat, Tube feeding  NUTRITION DIAGNOSIS:  Malnutrition related to catabolic illness as evidenced by severe depletion of body fat, severe depletion of muscle mass.   GOAL:  Patient will meet greater than or equal to 90% of their needs   MONITOR:  TF tolerance, Skin, I & O's, Vent status, Labs  REASON FOR ASSESSMENT:  Consult Enteral/tube feeding initiation and management  ASSESSMENT:  Pt presented with left MCA stroke s/p revascularization and catheter directed TPA.  Patient is currently intubated on ventilator support MV: 12.6 L/min Temp (24hrs), Avg:98.9 F (37.2 C), Min:98 F (36.7 C), Max:99.3 F (37.4 C)  Propofol: 18.5 ml/hr provides: 488 kcal/day from lipid  Hx DVT with IVC filter in place On 3%  Height:  Ht Readings from Last 1 Encounters:  12/05/2014 6\' 4"  (1.93 m)    Weight:  Wt Readings from Last 1 Encounters:  11/29/2014 226 lb 10.1 oz (102.8 kg)    Ideal Body Weight:  91.8 kg  Wt Readings from Last 10 Encounters:  12/06/2014 226 lb 10.1 oz (102.8 kg)  11/28/14 233 lb (105.688 kg)  08/23/14 240 lb (108.863 kg)  05/01/14 245 lb 12.8 oz (111.494 kg)  04/20/14 245 lb (111.131 kg)  10/31/13 243 lb (110.224 kg)  09/22/13 244 lb (110.678 kg)  09/20/13 244 lb (110.678 kg)  06/01/13 253 lb 4 oz (114.873 kg)  06/30/12 236 lb (107.049 kg)    BMI:  Body mass index is 27.6 kg/(m^2).  Estimated Nutritional Needs:  Kcal:  2181  Protein:  150-165 grams  Fluid:  > 2 L/day  Skin:  Reviewed, no issues  Diet Order:  Diet NPO time specified  EDUCATION NEEDS:  No education needs identified at this time   Intake/Output Summary (Last 24 hours) at 12/11/14 1647 Last data filed at 12/11/14 1600  Gross per 24 hour  Intake 1800.39 ml  Output   1500 ml  Net 300.39 ml    Last BM:  PTA  Maylon Peppers RD, [place],  [place] Pager [phone] After Hours Pager

## 2014-12-11 NOTE — Progress Notes (Signed)
Pt wife and son at bedside requesting that due to extensive medical and cardiac history and personal wishes, he would not like any interventions in the event of an cardiac arrest. Dr Leonie Man notified and came to bedside and discussed with family. Pt made LCB with no chest compressions, shocking, or medications. Emotional support given.

## 2014-12-11 NOTE — Care Management (Signed)
Important Message  Patient Details  Name: Paul Dalton MRN: [id number] Date of Birth: 1930-01-10   Medicare Important Message Given:  Yes-second notification given    Pricilla Handler 12/11/2014, 3:28 PM

## 2014-12-11 NOTE — Care Management Note (Signed)
Case Management Note  Patient Details  Name: Paul Dalton MRN: [id number] Date of Birth: July 26, 1929  Subjective/Objective:    Pt admitted on 12/07/2014 with Lt MCA stroke.  PTA, pt independent, lives with spouse.                  Action/Plan: Will follow for discharge planning as pt progresses.    Expected Discharge Date:                  Expected Discharge Plan:  IP Rehab Facility  In-House Referral:  Clinical Social Work  Discharge planning Services  CM Consult  Post Acute Care Choice:    Choice offered to:     DME Arranged:    DME Agency:     HH Arranged:    [place] Agency:     Status of Service:  In process, will continue to follow  Medicare Important Message Given:  Yes-second notification given Date Medicare IM Given:    Medicare IM give by:    Date Additional Medicare IM Given:    Additional Medicare Important Message give by:     If discussed at [place] of Stay Meetings, dates discussed:    Additional Comments:  Reinaldo Raddle, RN, BSN  Trauma/Neuro ICU Case Manager [phone]

## 2014-12-11 NOTE — Progress Notes (Signed)
SLP Cancellation Note  Patient Details Name: CAYLEN YARDLEY MRN: [id number] DOB: April 18, 1930   Cancelled treatment:       Reason Eval/Treat Not Completed: Medical issues which prohibited therapy (pt remains on vent - will f/u on next date)   Germain Osgood, M.A. CCC-SLP [phone]  Germain Osgood 12/11/2014, 10:35 AM

## 2014-12-11 NOTE — Progress Notes (Signed)
PT Cancellation Note  Patient Details Name: Paul Dalton MRN: [id number] DOB: [birth year]   Cancelled Treatment:    Reason Eval/Treat Not Completed: Patient not medically ready.  Pt continues to be on strict bedrest.  Please advance activity order once appropriate for PT and mobility.     Capricia Serda, Thornton Papas 12/11/2014, 7:51 AM

## 2014-12-11 NOTE — Procedures (Signed)
Central Venous Catheter Insertion Procedure Note KARSTEN HOWRY [id number] [birth year]  Procedure: Insertion of Central Venous Catheter Indications: Assessment of intravascular volume, Drug and/or fluid administration and Frequent blood sampling  Procedure Details Consent: Risks of procedure as well as the alternatives and risks of each were explained to the (patient/caregiver).  Consent for procedure obtained. Time Out: Verified patient identification, verified procedure, site/side was marked, verified correct patient position, special equipment/implants available, medications/allergies/relevent history reviewed, required imaging and test results available.  Performed  Maximum sterile technique was used including antiseptics, cap, gloves, gown, hand hygiene, mask and sheet. Skin prep: Chlorhexidine; local anesthetic administered A non-antimicrobial bonded/coated triple lumen catheter was placed in the left subclavian vein using the Seldinger technique.  Evaluation Blood flow good Complications: No apparent complications Patient did tolerate procedure well. Chest X-ray ordered to verify placement.  CXR: pending.  Procedure performed under direct ultrasound guidance for real time vessel cannulation.      Montey Hora, [place] [facility] Pager: [phone]  or [phone] 12/11/2014, 10:42 AM  U/S used in placement.  CXR post is in good position.  No PTX.  I was present and supervised the entire procedure.  Rush Farmer, M.D. [facility] Pulmonary/Critical Care Medicine. Pager: [phone]. After hours pager: [phone].

## 2014-12-12 ENCOUNTER — Inpatient Hospital Stay (HOSPITAL_COMMUNITY): Payer: Medicare Other

## 2014-12-12 DIAGNOSIS — R4182 Altered mental status, unspecified: Secondary | ICD-10-CM

## 2014-12-12 DIAGNOSIS — J9601 Acute respiratory failure with hypoxia: Secondary | ICD-10-CM

## 2014-12-12 DIAGNOSIS — G936 Cerebral edema: Secondary | ICD-10-CM | POA: Insufficient documentation

## 2014-12-12 LAB — GLUCOSE, CAPILLARY
GLUCOSE-CAPILLARY: 214 mg/dL — AB (ref 65–99)
GLUCOSE-CAPILLARY: 243 mg/dL — AB (ref 65–99)
GLUCOSE-CAPILLARY: 253 mg/dL — AB (ref 65–99)
Glucose-Capillary: 218 mg/dL — ABNORMAL HIGH (ref 65–99)
Glucose-Capillary: 256 mg/dL — ABNORMAL HIGH (ref 65–99)
Glucose-Capillary: 266 mg/dL — ABNORMAL HIGH (ref 65–99)

## 2014-12-12 LAB — BASIC METABOLIC PANEL
ANION GAP: 8 (ref 5–15)
BUN: 22 mg/dL — ABNORMAL HIGH (ref 6–20)
CHLORIDE: 119 mmol/L — AB (ref 101–111)
CO2: 25 mmol/L (ref 22–32)
CREATININE: 0.87 mg/dL (ref 0.61–1.24)
Calcium: 8.6 mg/dL — ABNORMAL LOW (ref 8.9–10.3)
GFR calc Af Amer: >60 mL/min (ref >60)
GFR calc non Af Amer: >60 mL/min (ref >60)
Glucose, Bld: 259 mg/dL — ABNORMAL HIGH (ref 65–99)
Potassium: 3.8 mmol/L (ref 3.5–5.1)
SODIUM: 152 mmol/L — AB (ref 135–145)

## 2014-12-12 LAB — SODIUM
SODIUM: 153 mmol/L — AB (ref 135–145)
Sodium: 146 mmol/L — ABNORMAL HIGH (ref 135–145)
Sodium: 163 mmol/L (ref 135–145)

## 2014-12-12 LAB — BLOOD GAS, ARTERIAL
ACID-BASE DEFICIT: 1 mmol/L (ref 0.0–2.0)
BICARBONATE: 23.8 meq/L (ref 20.0–24.0)
Drawn by: 39898
FIO2: 0.3 %
MECHVT: 550 mL
O2 Saturation: 97.5 %
PEEP: 5 cmH2O
Patient temperature: 98.6
RATE: 20 resp/min
TCO2: 25.2 mmol/L (ref 0–100)
pCO2 arterial: 44.5 mmHg (ref 35.0–45.0)
pH, Arterial: 7.348 — ABNORMAL LOW (ref 7.350–7.450)
pO2, Arterial: 106 mmHg — ABNORMAL HIGH (ref 80.0–100.0)

## 2014-12-12 LAB — CBC
HCT: 30 % — ABNORMAL LOW (ref 39.0–52.0)
Hemoglobin: 9.7 g/dL — ABNORMAL LOW (ref 13.0–17.0)
MCH: 32.3 pg (ref 26.0–34.0)
MCHC: 32.3 g/dL (ref 30.0–36.0)
MCV: 100 fL (ref 78.0–100.0)
Platelets: 130 10*3/uL — ABNORMAL LOW (ref 150–400)
RBC: 3 MIL/uL — ABNORMAL LOW (ref 4.22–5.81)
RDW: 16 % — ABNORMAL HIGH (ref 11.5–15.5)
WBC: 17.6 10*3/uL — ABNORMAL HIGH (ref 4.0–10.5)

## 2014-12-12 LAB — MAGNESIUM: Magnesium: 2.4 mg/dL (ref 1.7–2.4)

## 2014-12-12 LAB — PHOSPHORUS: Phosphorus: 2.2 mg/dL — ABNORMAL LOW (ref 2.5–4.6)

## 2014-12-12 MED ORDER — INSULIN ASPART 100 UNIT/ML ~~LOC~~ SOLN
0.0000 [IU] | SUBCUTANEOUS | Status: DC
  Administered 2014-12-12 (×2): 11 [IU] via SUBCUTANEOUS
  Administered 2014-12-12: 7 [IU] via SUBCUTANEOUS
  Administered 2014-12-12: 11 [IU] via SUBCUTANEOUS
  Administered 2014-12-12 – 2014-12-13 (×3): 7 [IU] via SUBCUTANEOUS
  Administered 2014-12-13: 4 [IU] via SUBCUTANEOUS
  Administered 2014-12-13 (×2): 11 [IU] via SUBCUTANEOUS

## 2014-12-12 MED ORDER — SODIUM CHLORIDE 3 % IV SOLN
INTRAVENOUS | Status: DC
  Administered 2014-12-12: 100 mL/h via INTRAVENOUS
  Filled 2014-12-12 (×12): qty 500

## 2014-12-12 NOTE — Progress Notes (Signed)
eLink Physician-Brief Progress Note Patient Name: Paul Dalton DOB: 1929-12-01 MRN: [id number]   Date of Service  12/12/2014  HPI/Events of Note  Blood glucose = 206 >> 208 >> 243 on moderate dose Novolog SSI.  eICU Interventions  Will increase to resistant Novolog SSI.     Intervention Category Intermediate Interventions: Hyperglycemia - evaluation and treatment  Mackynzie Woolford [place] 12/12/2014, 1:10 AM

## 2014-12-12 NOTE — Progress Notes (Signed)
Inpatient Diabetes Program Recommendations  AACE/ADA: New Consensus Statement on Inpatient Glycemic Control (2013)  Target Ranges:  Prepandial:   less than 140 mg/dL      Peak postprandial:   less than 180 mg/dL (1-2 hours)      Critically ill patients:  140 - 180 mg/dL     Results for Paul Dalton, Paul Dalton (MRN [id number]) as of 12/12/2014 07:21  Ref. Range 12/11/2014 00:10 12/11/2014 04:45 12/11/2014 07:12 12/11/2014 11:33 12/11/2014 15:22 12/11/2014 20:03  Glucose-Capillary Latest Ref Range: 65-99 mg/dL 158 (H) 128 (H) 184 (H) 120 (H) 206 (H) 208 (H)    Results for Paul Dalton, Paul Dalton (MRN [id number]) as of 12/12/2014 07:21  Ref. Range 12/12/2014 00:34 12/12/2014 03:44  Glucose-Capillary Latest Ref Range: 65-99 mg/dL 243 (H) 214 (H)    Results for Paul Dalton, Paul Dalton (MRN [id number]) as of 12/12/2014 07:21  Ref. Range 12/10/2014 06:10  Hemoglobin A1C Latest Ref Range: 4.8-5.6 % 7.8 (H)    Admit with: CVA  History: DM, CAD  Home DM Meds: Lantus 45 units QAM       Humalog 5 units bidwc      Janumet 50/1000- 1 tablet AM/ 1/2 tablet PM  Current DM Orders: Novolog Resistant SSI (0-20 units) Q4 hours    **Note Vital AF tube feedings started last PM.  TF not yet at goal rate of 40cc/hour.  **CBGs have progressively increased since last PM.  **Note Novolog SSI increased to Resistant scale last PM.   MD- Please consider the following in-hospital insulin adjustments:  1. Start Lantus 22 units daily (50% of home dose- give 1st dose this AM)  2. Decrease Novolog SSI to Moderate scale (0-15 units) Q4 hours  3. Add low dose Novolog Tube Feed coverage- Novolog 3 units Q4 hours     Will follow Wyn Quaker RN, MSN, CDE Diabetes Coordinator Inpatient Glycemic Control Team Team Pager: [phone] (8a-5p)

## 2014-12-12 NOTE — Progress Notes (Signed)
OT Cancellation and Discharge  Note  Patient Details Name: PARMVIR BOOMER MRN: [id number] DOB: 1930/03/31   Cancelled Treatment:    Reason Eval/Treat Not Completed: Patient not medically ready.  Pt remains on strict bedrest and intubated.  Will sign off at this time.  Please re-order therapies if/when pt becomes medically appropriate.   Darlina Rumpf [place], OTR/L [phone]  12/12/2014, 10:42 AM

## 2014-12-12 NOTE — Progress Notes (Signed)
PULMONARY  / CRITICAL CARE MEDICINE CONSULTATION   Name: Paul Dalton MRN: [id number] DOB: [birth year]    ADMISSION DATE:  12/03/2014 CONSULTATION DATE: December 12, 2014  REQUESTING CLINICIAN: Garvin Fila, MD PRIMARY SERVICE: Neurology  CHIEF COMPLAINT:  Sudden onset aphasia  BRIEF PATIENT DESCRIPTION: 81 y/o man with multiple risk factors for stroke on Lovenox for treatment of DVT who presented with left MCA stroke s/p revascularization and catheter directed TPA.  SIGNIFICANT EVENTS / STUDIES:  12/08/2014 - underwent intervention of acute L MCA stroke with 7.5 mg of superselective catheter-directed tPA.  LINES / TUBES: 8.0 ETT - 6/26 R fem Art line - 6/26 L fem arterial sheath - 6/26 Foley - 6/26  CULTURES: none  ANTIBIOTICS: Ancef x1 6/26  HISTORY OF PRESENT ILLNESS:  Paul Dalton is a 81 year old man with a history of CAD, DM, prior TIA, and seizure in March of 2016. He was found to have a DVT within a week of his presentation and was started on lovenox, 100 mg BID. He was brought to the ED on 6/26 after developing sudden onset aphasia and R sided weakness. He had a NIHSS of 22 and a head CT did not demonstrate any hemorrhage. Due to his anticoagulated state, tPA was not given, but he was taken by IR for catheter intervention where he had an angular branch of his L MCA occluded, as well as frontal branch of a superior division of the same. He had complete revascularization of the angular branch, and partial revascularization of the frontal branch. He was brought to the ICU and remain intubated. PCCM was consulted for critical care management.  SUBJECTIVE: Awake and interactive.  VITAL SIGNS: Temp:  [97.8 F (36.6 C)-101.3 F (38.5 C)] 98.8 F (37.1 C) (06/29 0740) Pulse Rate:  [69-87] 72 (06/29 0800) Resp:  [16-32] 19 (06/29 0800) BP: (109-175)/(51-84) 175/75 mmHg (06/29 0800) SpO2:  [97 %-100 %] 100 % (06/29 0800) FiO2 (%):  [30 %] 30 % (06/29 0800) Weight:  [101 kg (222  lb 10.6 oz)] 101 kg (222 lb 10.6 oz) (06/29 0400) HEMODYNAMICS:   VENTILATOR SETTINGS: Vent Mode:  [-] CPAP;PSV FiO2 (%):  [30 %] 30 % Set Rate:  [20 bmp] 20 bmp Vt Set:  [550 mL] 550 mL PEEP:  [5 cmH20] 5 cmH20 Pressure Support:  [8 cmH20] 8 cmH20 Plateau Pressure:  [13 cmH20] 13 cmH20 INTAKE / OUTPUT: Intake/Output      06/28 0701 - 06/29 0700 06/29 0701 - 06/30 0700   I.V. (mL/kg) 2100 (20.8) 106.2 (1.1)   NG/GT 178 30   IV Piggyback 50    Total Intake(mL/kg) 2328 (23) 136.2 (1.3)   Urine (mL/kg/hr) 1325 (0.5) 200 (1)   Total Output 1325 200   Net +1003 -63.9         PHYSICAL EXAMINATION: General:  Elderly man, intubated, sluggish but follows some commands on the left. Neuro:  Arousable, following some commands on the left but right sided hemiparesis. HEENT:  [place]/AT, PERRL, EOM-I and MMM Neck: No masses, -JVD. Cardiovascular:  RRR, Nl S1/S2, -M/R/G. Lungs:  CTA bilaterally Abdomen:  Soft, NT, ND and +BS. Musculoskeletal:  No deformities. Skin:  Scattered ecchymoses.  LABS:  CBC  Recent Labs Lab 12/10/14 0610 12/11/14 0240 12/12/14 0411  WBC 9.6 13.4* 17.6*  HGB 11.1* 11.3* 9.7*  HCT 32.8* 34.1* 30.0*  PLT 174 122* 130*   Coag's  Recent Labs Lab 12/03/2014 1425  APTT 38*  INR 1.29   BMET  Recent Labs Lab 12/10/14 0610 12/11/14 0240 12/11/14 1600 12/11/14 2057 12/12/14 0411  NA 139 140 144 144 152*  K 4.0 4.1  --   --  3.8  CL 106 106  --   --  119*  CO2 26 26  --   --  25  BUN 19 14  --   --  22*  CREATININE 0.90 0.88  --   --  0.87  GLUCOSE 145* 170*  --   --  259*   Electrolytes  Recent Labs Lab 12/10/14 0610 12/11/14 0240 12/12/14 0411  CALCIUM 8.7* 8.4* 8.6*  MG  --  1.9 2.4  PHOS  --  3.0 2.2*   Sepsis Markers No results for input(s): LATICACIDVEN, PROCALCITON, O2SATVEN in the last 168 hours.   ABG  Recent Labs Lab 11/29/2014 1835 12/11/14 0435 12/12/14 0448  PHART 7.379 7.418 7.348*  PCO2ART 44.4 39.0 44.5  PO2ART 214*  99.3 106*   Liver Enzymes  Recent Labs Lab 12/02/2014 1425  AST 60*  ALT 69*  ALKPHOS 207*  BILITOT 0.9  ALBUMIN 3.5   Cardiac Enzymes No results for input(s): TROPONINI, PROBNP in the last 168 hours. Glucose  Recent Labs Lab 12/11/14 0712 12/11/14 1133 12/11/14 1522 12/11/14 2003 12/12/14 0034 12/12/14 0344  GLUCAP 184* 120* 206* 208* 243* 214*   Imaging Ct Head Wo Contrast  12/10/2014   CLINICAL DATA:  Left MCA stroke.  Hemorrhage.  EXAM: CT HEAD WITHOUT CONTRAST  TECHNIQUE: Contiguous axial images were obtained from the base of the skull through the vertex without intravenous contrast.  COMPARISON:  A earlier this day at 0448 hr  FINDINGS: Intraparenchymal hemorrhage in the left temporoparietal lobe, minimally increased from prior exam measuring approximately 5.0 x 3.5 cm, previously 5.0 x 2.7 cm. Adjacent small satellite hemorrhages muscle slightly larger. Associated loss of gray-white differentiation and progressive hypodensity in the left occipital, temporoparietal and frontal infarcts, similar in distribution to prior exam. Left-to-right midline shift of 3 mm, previously 2 mm. Effacement of the left lateral ventricle is unchanged. No developing hydrocephalus. The basilar cisterns are patent. No convincing extra-axial hematoma. No new hemorrhage. Paranasal sinuses and mastoid air cells remain clear.  IMPRESSION: 1. Minimal increased size of the left temporal parietal hematoma from prior exam, currently 5.0 x 3.5 cm, previously 5.0 x 2.7 cm. Minimal increased size of the small adjacent satellite hematomas. 2. Grossly unchanged midline shift of 3 mm, previously 2 mm. 3. Infarcts in the left frontal, temporal, parietal occipital lobes, with expected evolution.   Electronically Signed   By: Paul Dalton M.D.   On: 12/10/2014 23:03   Ir Ivc Filter Plmt / S&i /img Guid/mod Sed  12/10/2014   CLINICAL DATA:  81 year old gentleman with a history of cerebral vascular accident, with  lower extremity DVT. He is not a candidate for anti coagulation medication.  An IVC filter is indicated for placement.  EXAM: ULTRASOUND GUIDANCE FOR VASCULAR ACCESS  IVC CATHETERIZATION AND VENOGRAM  IVC FILTER INSERTION  Date:  12/08/2014; 6/27/20166/26/2016 5:55 pm; 12/10/2014 3:25 pm  FLUOROSCOPY TIME:  1 minutes  MEDICATIONS AND MEDICAL HISTORY: No sedation  ANESTHESIA/SEDATION: None  CONTRAST:  58mL OMNIPAQUE IOHEXOL 300 MG/ML SOLN; 45mL OMNIPAQUE IOHEXOL 300 MG/ML SOLN  COMPLICATIONS: None  PROCEDURE: Informed consent was obtained from the patient following explanation of the procedure, risks, benefits and alternatives. The patient understands, agrees and consents for the procedure. All questions were addressed. A time out was performed.  Maximal barrier sterile technique  utilized including caps, mask, sterile gowns, sterile gloves, large sterile drape, hand hygiene, and betadine prep.  Ultrasound survey was performed of the right jugular region with images stored and sent to PACs.  Under sterile condition and local anesthesia, right internal jugular venous access was performed with ultrasound. Over a guide wire, the IVC filter delivery sheath and inner dilator were advanced into the IVC just above the IVC bifurcation. Contrast injection was performed for an IVC venogram.  A jugular approach Denali retrievable filter was then placed into the IVC, below the bilateral renal veins, and below the widest segment of the IVC.  FINDINGS: IVC VENOGRAM: The IVC is patent. No evidence of thrombus, stenosis, or occlusion. No variant venous anatomy. The renal veins are identified at L1-L2.  The diameter of the IVC just below the renal veins measured greater than the largest diameter compatible with indication for use for this particular filter, at least 29 mm.  The segment of IVC with a diameter compatible with this filter was at the level of L4, above the iliac vein inflow.  IVC FILTER INSERTION: Through the delivery  sheath, the [facility] retrieval IVC filter was deployed in the infrarenal IVC at the L4 level below the renal veins and above the IVC bifurcation. Contrast injection confirmed position. There is good apposition of the filter against the IVC.  The delivery sheath was removed and hemostasis was obtained with compression for 5 minutes. The patient tolerated the procedure well. No immediate complications.  IMPRESSION: Status post IV C filter placement via right internal jugular approach, below the renal veins at the most narrow segment of the infrarenal IVC.  This IVC filter is potentially retrievable. The patient may be assessed for filter retrieval by Interventional Radiology in approximately 8-12 weeks. Further recommendations regarding filter retrieval, continued surveillance or declaration of device permanence, can be made at that time.  Signed,  Dulcy Fanny. Earleen Newport, DO  [facility]  [facility]   Electronically Signed   By: Corrie Mckusick D.O.   On: 12/10/2014 17:52   Dg Chest Port 1 View  12/12/2014   CLINICAL DATA:  Endotracheal tube  EXAM: PORTABLE CHEST - 1 VIEW  COMPARISON:  12/11/2014  FINDINGS: Right pacer, left central line, endotracheal tube and NG tube remain in place, unchanged. There is cardiomegaly. Bilateral lower lobe airspace opacities have increased since prior study. No visible significant effusions. No acute bony abnormality.  IMPRESSION: Cardiomegaly.  Increasing bilateral lower lobe opacities. Cannot exclude pneumonia.   Electronically Signed   By: Rolm Baptise M.D.   On: 12/12/2014 08:00   Dg Chest Port 1 View  12/11/2014   CLINICAL DATA:  Central line and orogastric tube placement.  EXAM: PORTABLE CHEST - 1 VIEW  COMPARISON:  12/11/2014  FINDINGS: There has been an interval placement of central venous catheter from the left subclavian approach, tip overlying the expected location of the cavoatrial junction. There has also been placement of an  enteric catheter, with tip collimated off the image. Endotracheal tube is unchanged and in satisfactory position. Dual lead cardiac pacemaker is also unchanged.  Cardiomediastinal silhouette is stably enlarged. The aorta is torturous and contains atherosclerotic calcifications.  There is no evidence of focal airspace consolidation, pleural effusion or pneumothorax. Coarsening of the interstitial markings is again noted, likely due to chronic interstitial changes. There is probable left lung base subsegmental atelectasis.  Osseous structures are without acute abnormality. Soft tissues are grossly normal.  IMPRESSION: Status post central line  placement.  No evidence of pneumothorax.  Enteric catheter placement, tip collimated off the image.  Chronic interstitial lung changes, and left lower lobe atelectasis.   Electronically Signed   By: Fidela Salisbury M.D.   On: 12/11/2014 12:00   Dg Chest Port 1 View  12/11/2014   CLINICAL DATA:  Check endotracheal tube placement  EXAM: PORTABLE CHEST - 1 VIEW  COMPARISON:  12/01/2014  FINDINGS: Cardiac shadow remains enlarged. A pacing device is again seen. Endotracheal tube is noted 7.7 cm above the carina. The lungs are well aerated bilaterally. Increasing left basilar infiltrate is noted.  IMPRESSION: Endotracheal tube in satisfactory position.  Increasing left basilar infiltrate.   Electronically Signed   By: Inez Catalina M.D.   On: 12/11/2014 07:13   Dg Abd Portable 1v  12/11/2014   CLINICAL DATA:  Orogastric tube placement  EXAM: PORTABLE ABDOMEN - 1 VIEW  COMPARISON:  12/10/2014  FINDINGS: Nonobstructive bowel gas pattern with moderate levoscoliosis lumbar spine and significant aortic calcification. Orogastric tube is identified in the position suggesting that it lies along the greater curvature of the stomach with tip in the mid to distal body of the stomach. Bilateral flank calcifications suggesting renal calculi.  IMPRESSION: Orogastric tube as described    Electronically Signed   By: Skipper Cliche M.D.   On: 12/11/2014 12:19   Ir Percutaneous Art Thrombectomy/infusion Intracranial Inc Diag Angio  12/11/2014   CLINICAL DATA:  81 year old gentleman with a history of cerebral vascular accident, with lower extremity DVT. He is not a candidate for anti coagulation medication.  An IVC filter is indicated for placement.  EXAM: ULTRASOUND GUIDANCE FOR VASCULAR ACCESS  IVC CATHETERIZATION AND VENOGRAM  IVC FILTER INSERTION  Date:  11/21/2014; 6/27/20166/26/2016 5:55 pm; 12/10/2014 3:25 pm  FLUOROSCOPY TIME:  1 minutes  MEDICATIONS AND MEDICAL HISTORY: No sedation  ANESTHESIA/SEDATION: None  CONTRAST:  33mL OMNIPAQUE IOHEXOL 300 MG/ML SOLN; 27mL OMNIPAQUE IOHEXOL 300 MG/ML SOLN  COMPLICATIONS: None  PROCEDURE: Informed consent was obtained from the patient following explanation of the procedure, risks, benefits and alternatives. The patient understands, agrees and consents for the procedure. All questions were addressed. A time out was performed.  Maximal barrier sterile technique utilized including caps, mask, sterile gowns, sterile gloves, large sterile drape, hand hygiene, and betadine prep.  Ultrasound survey was performed of the right jugular region with images stored and sent to PACs.  Under sterile condition and local anesthesia, right internal jugular venous access was performed with ultrasound. Over a guide wire, the IVC filter delivery sheath and inner dilator were advanced into the IVC just above the IVC bifurcation. Contrast injection was performed for an IVC venogram.  A jugular approach Denali retrievable filter was then placed into the IVC, below the bilateral renal veins, and below the widest segment of the IVC.  FINDINGS: IVC VENOGRAM: The IVC is patent. No evidence of thrombus, stenosis, or occlusion. No variant venous anatomy. The renal veins are identified at L1-L2.  The diameter of the IVC just below the renal veins measured greater than the largest  diameter compatible with indication for use for this particular filter, at least 29 mm.  The segment of IVC with a diameter compatible with this filter was at the level of L4, above the iliac vein inflow.  IVC FILTER INSERTION: Through the delivery sheath, the [facility] retrieval IVC filter was deployed in the infrarenal IVC at the L4 level below the renal veins and above the IVC bifurcation. Contrast injection confirmed position.  There is good apposition of the filter against the IVC.  The delivery sheath was removed and hemostasis was obtained with compression for 5 minutes. The patient tolerated the procedure well. No immediate complications.  IMPRESSION: Status post IV C filter placement via right internal jugular approach, below the renal veins at the most narrow segment of the infrarenal IVC.  This IVC filter is potentially retrievable. The patient may be assessed for filter retrieval by Interventional Radiology in approximately 8-12 weeks. Further recommendations regarding filter retrieval, continued surveillance or declaration of device permanence, can be made at that time.  Signed,  Dulcy Fanny. Earleen Newport, DO  [facility]  [facility]   Electronically Signed   By: Corrie Mckusick D.O.   On: 12/10/2014 17:52    EKG: V-paced rhythm. CXR: tube high  ASSESSMENT / PLAN:  PULMONARY A: Need for Mechanical Ventilation due to procedure Had DVTs diagnosed 2 wks ago P:   PS trials but no extubation until after 3% is complete and pending mental status. VAP prevention bundle. IVC filter in place.  CARDIOVASCULAR A: Hypertension Diastolic HF P:   Will maintain goal SBP >120, <140. Will need secondary prevention medications prior to d/c if mental status improves. Hold anticoag for at least one month.  RENAL A: Mild renal insuffiency,  P:   Will avoid further nephrotoxic drugs BMET in AM. Replace electrolytes as indicated.  GASTROINTESTINAL A: No active  issues. P:   Consult nutrition for TF. PPI.  HEMATOLOGIC A: Recent DVT P:   Hold lovenox for now given recent tPA and secondary hemorrhage.  IVC filter for one month then remove and restart anti-coag.  INFECTIOUS A: No active issues. P:   Monitor WBC and fever curve.  ENDOCRINE A: DM 2 P:   Moderate SSI ordered  NEUROLOGIC A: Acute L MCA stroke P:   S/p intervention. Defer to primary team for additional management. CT in AM, maintain careful BP control o/n  Spoke with family at length, LCB with intubation only, no trach/peg, if no improvement then withdraw, will follow neurology's lead on this.  The patient is critically ill with multiple organ systems failure and requires high complexity decision making for assessment and support, frequent evaluation and titration of therapies, application of advanced monitoring technologies and extensive interpretation of multiple databases.   Critical Care Time devoted to patient care services described in this note is  35  Minutes. This time reflects time of care of this signee Dr Jennet Maduro. This critical care time does not reflect procedure time, or teaching time or supervisory time of PA/NP/Med student/Med Resident etc but could involve care discussion time.  Rush Farmer, M.D. [facility] Pulmonary/Critical Care Medicine. Pager: [phone]. After hours pager: [phone].

## 2014-12-12 NOTE — Progress Notes (Signed)
SLP Cancellation Note  Patient Details Name: Paul Dalton MRN: [id number] DOB: [birth year]   Cancelled treatment:       Reason Eval/Treat Not Completed: Medical issues which prohibited therapy: pt remains intubated. SLP to s/o at this time, please re-order SLP when pt is able to participate in swallowing and cognitive-linguistic evaluations.   Germain Osgood, M.A. CCC-SLP [phone]  Germain Osgood 12/12/2014, 9:44 AM

## 2014-12-12 NOTE — Progress Notes (Signed)
1007 called Elink to notify MD of blood glucose of 243 on moderate sliding scale insulin. Orders placed for resistant scale by Dr. Oletta Darter.

## 2014-12-12 NOTE — Progress Notes (Signed)
STROKE TEAM PROGRESS NOTE   HISTORY Paul Dalton is an 81 y.o. male with multiple medical including HTN, hyperlipidemia, atrial fibrillation off anticoagulation, CAD, s/p pacemaker placement, TIA's, AAA, DVT on fullk dose Lovenox, OSA, brought in via EMS for evaluation of sudden onset expressive aphasia. Wife is at the bedside. He was home today 12/09/3014, ate lunch, and subsequently developed sudden onset of inability to speak and right sided weakness at 1300 (LKW). EMS was summoned and patient brought to the ED where he was alert and awake with NIHSS 22. CT brain showed no acute abnormality. Patient administered his dose of Lovenox this morning. NIHSS: 22. Patient was not administered TPA secondary to no intravenous thrombolysis was given as patient received Lovenox few hours before presentation. Taken to the interventional suite for angiogram and had Complete revascularization of angular branch ,and partial revascularization of the frontal branch with 7.5 mg of superselective IATPA. Marland Kitchen He was admitted to the neuro ICU for further evaluation and treatment.   SUBJECTIVE (INTERVAL HISTORY) His wife and son are at the bedside.  He remains intubated this am. Patient drowsy this am. On hypertonic saline and last sodium is at goal at 153 OBJECTIVE Temp:  [97.8 F (36.6 C)-101.3 F (38.5 C)] 98.7 F (37.1 C) (06/29 1147) Pulse Rate:  [69-87] 75 (06/29 1245) Cardiac Rhythm:  [-] Normal sinus rhythm (06/29 1200) Resp:  [19-32] 22 (06/29 1245) BP: (109-175)/(51-80) 152/72 mmHg (06/29 1245) SpO2:  [97 %-100 %] 100 % (06/29 1245) FiO2 (%):  [30 %] 30 % (06/29 1245) Weight:  [222 lb 10.6 oz (101 kg)] 222 lb 10.6 oz (101 kg) (06/29 0400)   Recent Labs Lab 12/11/14 1522 12/11/14 2003 12/12/14 0034 12/12/14 0344 12/12/14 1146  GLUCAP 206* 208* 243* 214* 256*    Recent Labs Lab 11/20/2014 1425 12/04/2014 1436 12/10/14 0610 12/11/14 0240 12/11/14 1600 12/11/14 2057 12/12/14 0411 12/12/14 0930   NA 138 137 139 140 144 144 152* 153*  K 4.4 4.3 4.0 4.1  --   --  3.8  --   CL 100* 101 106 106  --   --  119*  --   CO2 27  --  26 26  --   --  25  --   GLUCOSE 244* 244* 145* 170*  --   --  259*  --   BUN 26* 28* 19 14  --   --  22*  --   CREATININE 1.23 1.10 0.90 0.88  --   --  0.87  --   CALCIUM 9.3  --  8.7* 8.4*  --   --  8.6*  --   MG  --   --   --  1.9  --   --  2.4  --   PHOS  --   --   --  3.0  --   --  2.2*  --     Recent Labs Lab 12/05/2014 1425  AST 60*  ALT 69*  ALKPHOS 207*  BILITOT 0.9  PROT 6.7  ALBUMIN 3.5    Recent Labs Lab 12/13/2014 1425 11/28/2014 1436 12/10/14 0610 12/11/14 0240 12/12/14 0411  WBC 8.0  --  9.6 13.4* 17.6*  NEUTROABS 5.6  --  7.8*  --   --   HGB 12.6* 13.6 11.1* 11.3* 9.7*  HCT 36.4* 40.0 32.8* 34.1* 30.0*  MCV 95.5  --  96.2 98.0 100.0  PLT 179  --  174 122* 130*   No results for input(s): CKTOTAL,  CKMB, CKMBINDEX, TROPONINI in the last 168 hours.  Recent Labs  11/19/2014 1425  LABPROT 16.2*  INR 1.29   No results for input(s): COLORURINE, LABSPEC, PHURINE, GLUCOSEU, HGBUR, BILIRUBINUR, KETONESUR, PROTEINUR, UROBILINOGEN, NITRITE, LEUKOCYTESUR in the last 72 hours.  Invalid input(s): APPERANCEUR     Component Value Date/Time   CHOL 124 12/10/2014 0610   TRIG 156* 12/10/2014 0610   HDL 30* 12/10/2014 0610   CHOLHDL 4.1 12/10/2014 0610   VLDL 31 12/10/2014 0610   LDLCALC 63 12/10/2014 0610   Lab Results  Component Value Date   HGBA1C 7.8* 12/10/2014   No results found for: LABOPIA, COCAINSCRNUR, LABBENZ, AMPHETMU, THCU, LABBARB  No results for input(s): ETH in the last 168 hours.  Ct Head Wo Contrast  12/10/2014   CLINICAL DATA:  Left MCA stroke.  Hemorrhage.  EXAM: CT HEAD WITHOUT CONTRAST  TECHNIQUE: Contiguous axial images were obtained from the base of the skull through the vertex without intravenous contrast.  COMPARISON:  A earlier this day at 0448 hr  FINDINGS: Intraparenchymal hemorrhage in the left  temporoparietal lobe, minimally increased from prior exam measuring approximately 5.0 x 3.5 cm, previously 5.0 x 2.7 cm. Adjacent small satellite hemorrhages muscle slightly larger. Associated loss of gray-white differentiation and progressive hypodensity in the left occipital, temporoparietal and frontal infarcts, similar in distribution to prior exam. Left-to-right midline shift of 3 mm, previously 2 mm. Effacement of the left lateral ventricle is unchanged. No developing hydrocephalus. The basilar cisterns are patent. No convincing extra-axial hematoma. No new hemorrhage. Paranasal sinuses and mastoid air cells remain clear.  IMPRESSION: 1. Minimal increased size of the left temporal parietal hematoma from prior exam, currently 5.0 x 3.5 cm, previously 5.0 x 2.7 cm. Minimal increased size of the small adjacent satellite hematomas. 2. Grossly unchanged midline shift of 3 mm, previously 2 mm. 3. Infarcts in the left frontal, temporal, parietal occipital lobes, with expected evolution.   Electronically Signed   By: Jeb Levering M.D.   On: 12/10/2014 23:03   Ir Ivc Filter Plmt / S&i /img Guid/mod Sed  12/10/2014   CLINICAL DATA:  81 year old gentleman with a history of cerebral vascular accident, with lower extremity DVT. He is not a candidate for anti coagulation medication.  An IVC filter is indicated for placement.  EXAM: ULTRASOUND GUIDANCE FOR VASCULAR ACCESS  IVC CATHETERIZATION AND VENOGRAM  IVC FILTER INSERTION  Date:  12/05/2014; 6/27/20166/26/2016 5:55 pm; 12/10/2014 3:25 pm  FLUOROSCOPY TIME:  1 minutes  MEDICATIONS AND MEDICAL HISTORY: No sedation  ANESTHESIA/SEDATION: None  CONTRAST:  73mL OMNIPAQUE IOHEXOL 300 MG/ML SOLN; 65mL OMNIPAQUE IOHEXOL 300 MG/ML SOLN  COMPLICATIONS: None  PROCEDURE: Informed consent was obtained from the patient following explanation of the procedure, risks, benefits and alternatives. The patient understands, agrees and consents for the procedure. All questions were  addressed. A time out was performed.  Maximal barrier sterile technique utilized including caps, mask, sterile gowns, sterile gloves, large sterile drape, hand hygiene, and betadine prep.  Ultrasound survey was performed of the right jugular region with images stored and sent to PACs.  Under sterile condition and local anesthesia, right internal jugular venous access was performed with ultrasound. Over a guide wire, the IVC filter delivery sheath and inner dilator were advanced into the IVC just above the IVC bifurcation. Contrast injection was performed for an IVC venogram.  A jugular approach Denali retrievable filter was then placed into the IVC, below the bilateral renal veins, and below the widest segment of the  IVC.  FINDINGS: IVC VENOGRAM: The IVC is patent. No evidence of thrombus, stenosis, or occlusion. No variant venous anatomy. The renal veins are identified at L1-L2.  The diameter of the IVC just below the renal veins measured greater than the largest diameter compatible with indication for use for this particular filter, at least 29 mm.  The segment of IVC with a diameter compatible with this filter was at the level of L4, above the iliac vein inflow.  IVC FILTER INSERTION: Through the delivery sheath, the [facility] retrieval IVC filter was deployed in the infrarenal IVC at the L4 level below the renal veins and above the IVC bifurcation. Contrast injection confirmed position. There is good apposition of the filter against the IVC.  The delivery sheath was removed and hemostasis was obtained with compression for 5 minutes. The patient tolerated the procedure well. No immediate complications.  IMPRESSION: Status post IV C filter placement via right internal jugular approach, below the renal veins at the most narrow segment of the infrarenal IVC.  This IVC filter is potentially retrievable. The patient may be assessed for filter retrieval by Interventional Radiology in approximately 8-12 weeks. Further  recommendations regarding filter retrieval, continued surveillance or declaration of device permanence, can be made at that time.  Signed,  Dulcy Fanny. Earleen Newport, DO  [facility]  [facility]   Electronically Signed   By: Corrie Mckusick D.O.   On: 12/10/2014 17:52   Dg Chest Port 1 View  12/12/2014   CLINICAL DATA:  Endotracheal tube  EXAM: PORTABLE CHEST - 1 VIEW  COMPARISON:  12/11/2014  FINDINGS: Right pacer, left central line, endotracheal tube and NG tube remain in place, unchanged. There is cardiomegaly. Bilateral lower lobe airspace opacities have increased since prior study. No visible significant effusions. No acute bony abnormality.  IMPRESSION: Cardiomegaly.  Increasing bilateral lower lobe opacities. Cannot exclude pneumonia.   Electronically Signed   By: Rolm Baptise M.D.   On: 12/12/2014 08:00   Dg Chest Port 1 View  12/11/2014   CLINICAL DATA:  Central line and orogastric tube placement.  EXAM: PORTABLE CHEST - 1 VIEW  COMPARISON:  12/11/2014  FINDINGS: There has been an interval placement of central venous catheter from the left subclavian approach, tip overlying the expected location of the cavoatrial junction. There has also been placement of an enteric catheter, with tip collimated off the image. Endotracheal tube is unchanged and in satisfactory position. Dual lead cardiac pacemaker is also unchanged.  Cardiomediastinal silhouette is stably enlarged. The aorta is torturous and contains atherosclerotic calcifications.  There is no evidence of focal airspace consolidation, pleural effusion or pneumothorax. Coarsening of the interstitial markings is again noted, likely due to chronic interstitial changes. There is probable left lung base subsegmental atelectasis.  Osseous structures are without acute abnormality. Soft tissues are grossly normal.  IMPRESSION: Status post central line placement.  No evidence of pneumothorax.  Enteric catheter placement,  tip collimated off the image.  Chronic interstitial lung changes, and left lower lobe atelectasis.   Electronically Signed   By: Fidela Salisbury M.D.   On: 12/11/2014 12:00   Dg Chest Port 1 View  12/11/2014   CLINICAL DATA:  Check endotracheal tube placement  EXAM: PORTABLE CHEST - 1 VIEW  COMPARISON:  11/19/2014  FINDINGS: Cardiac shadow remains enlarged. A pacing device is again seen. Endotracheal tube is noted 7.7 cm above the carina. The lungs are well aerated bilaterally. Increasing left basilar infiltrate is noted.  IMPRESSION: Endotracheal tube in satisfactory position.  Increasing left basilar infiltrate.   Electronically Signed   By: Inez Catalina M.D.   On: 12/11/2014 07:13   Dg Abd Portable 1v  12/11/2014   CLINICAL DATA:  Orogastric tube placement  EXAM: PORTABLE ABDOMEN - 1 VIEW  COMPARISON:  12/10/2014  FINDINGS: Nonobstructive bowel gas pattern with moderate levoscoliosis lumbar spine and significant aortic calcification. Orogastric tube is identified in the position suggesting that it lies along the greater curvature of the stomach with tip in the mid to distal body of the stomach. Bilateral flank calcifications suggesting renal calculi.  IMPRESSION: Orogastric tube as described   Electronically Signed   By: Skipper Cliche M.D.   On: 12/11/2014 12:19   Ir Percutaneous Art Thrombectomy/infusion Intracranial Inc Diag Angio  12/11/2014   CLINICAL DATA:  81 year old gentleman with a history of cerebral vascular accident, with lower extremity DVT. He is not a candidate for anti coagulation medication.  An IVC filter is indicated for placement.  EXAM: ULTRASOUND GUIDANCE FOR VASCULAR ACCESS  IVC CATHETERIZATION AND VENOGRAM  IVC FILTER INSERTION  Date:  11/22/2014; 6/27/20166/26/2016 5:55 pm; 12/10/2014 3:25 pm  FLUOROSCOPY TIME:  1 minutes  MEDICATIONS AND MEDICAL HISTORY: No sedation  ANESTHESIA/SEDATION: None  CONTRAST:  82mL OMNIPAQUE IOHEXOL 300 MG/ML SOLN; 47mL OMNIPAQUE IOHEXOL 300 MG/ML  SOLN  COMPLICATIONS: None  PROCEDURE: Informed consent was obtained from the patient following explanation of the procedure, risks, benefits and alternatives. The patient understands, agrees and consents for the procedure. All questions were addressed. A time out was performed.  Maximal barrier sterile technique utilized including caps, mask, sterile gowns, sterile gloves, large sterile drape, hand hygiene, and betadine prep.  Ultrasound survey was performed of the right jugular region with images stored and sent to PACs.  Under sterile condition and local anesthesia, right internal jugular venous access was performed with ultrasound. Over a guide wire, the IVC filter delivery sheath and inner dilator were advanced into the IVC just above the IVC bifurcation. Contrast injection was performed for an IVC venogram.  A jugular approach Denali retrievable filter was then placed into the IVC, below the bilateral renal veins, and below the widest segment of the IVC.  FINDINGS: IVC VENOGRAM: The IVC is patent. No evidence of thrombus, stenosis, or occlusion. No variant venous anatomy. The renal veins are identified at L1-L2.  The diameter of the IVC just below the renal veins measured greater than the largest diameter compatible with indication for use for this particular filter, at least 29 mm.  The segment of IVC with a diameter compatible with this filter was at the level of L4, above the iliac vein inflow.  IVC FILTER INSERTION: Through the delivery sheath, the [facility] retrieval IVC filter was deployed in the infrarenal IVC at the L4 level below the renal veins and above the IVC bifurcation. Contrast injection confirmed position. There is good apposition of the filter against the IVC.  The delivery sheath was removed and hemostasis was obtained with compression for 5 minutes. The patient tolerated the procedure well. No immediate complications.  IMPRESSION: Status post IV C filter placement via right internal jugular  approach, below the renal veins at the most narrow segment of the infrarenal IVC.  This IVC filter is potentially retrievable. The patient may be assessed for filter retrieval by Interventional Radiology in approximately 8-12 weeks. Further recommendations regarding filter retrieval, continued surveillance or declaration of device permanence, can be made at that time.  Signed,  York Cerise  Pasty Arch, DO  [facility]  [facility]   Electronically Signed   By: Corrie Mckusick D.O.   On: 12/10/2014 17:52   Cerebral Angiogram 1occluded prominent angular branch of LT MCA andfrontal branch of superior division. Complete revascularization of angular branch ,and partial revascularization of the frontal branch with 7.5 mg of superselective IATPA  2D Echocardiogram   - Left ventricle: The cavity size was normal. Systolic function wasnormal. The estimated ejection fraction was in the range of 55%to 60%. Wall motion was normal; there were no regional wallmotion abnormalities. - Aortic valve: There was moderate stenosis. There was mildregurgitation. Valve area (VTI): 1.28 cm^2. Valve area (Vmax):1.19 cm^2. Valve area (Vmean): 1.24 cm^2. - Mitral valve: There was mild regurgitation. - Left atrium: The atrium was mildly dilated. - Right atrium: The atrium was moderately to severely dilated. - Tricuspid valve: There was moderate regurgitation. - Pulmonary arteries: PA peak pressure: 55 mm Hg (S).   PHYSICAL EXAM Elderly caucasian male not in distress. . Afebrile. Head is nontraumatic. Neck is supple without bruit.    Cardiac exam no murmur or gallop. Lungs are clear to auscultation. Distal pulses are well felt. He has right groin arterial sheath Neurological Exam : Patient is intubated. drowsy and  arousable. Pupils are 4 mm equal reactive. Fundi were not visualized. Left gaze preference.   Follows occasional midline commands.. Right lower facial weakness. Tongue is  midline. Extraocular movements are full range without nystagmus or restriction. Right hemiparesis but unable to move her right hand and lower extremity at all.Trace RLE withdrawal to pain. Normal antigravity movements on the left side. Diminished sensation on the right compared to the left. Right plantar upgoing left downgoing. Gait was not tested.    ASSESSMENT/PLAN Paul Dalton is a 81 y.o. male with history of CAD, DM, prior TIA, and seizure in March of 2016, found to have a DVT within 1 week and started on lovenox 100 mg BID presenting with sudden onset of aphasia. He did not receive IV t-PA due to being on full dose lovenox, but was taken to neurointervention for angio, s/p complete revascularization of L MCA angular branch and partial revascularization of the frontal branch with 7.5 mg of superselective IA TPA.  Stroke:  Left MCA and PCA territory infarcts s/p partial revascularization w/ neurointervenion now with L temporal parietal and satellite hematomas, infarct likely embolic secondary to unknown etiology. Hematoma secondary to IA, now symptomatic. tPA/intervention  Resultant  VDRF, R hemiparesis  Cerebral angio occluded prominent angular branch of LT MCA  CT  Post intervention 5 x 2.7 cm acute LEFT temporal parietal hematoma with small satellite hematomas.  Acute LEFT frontal, LEFT temporal, LEFT parietal and LEFT occipital lobe infarcts spanning LEFT middle cerebral and LEFT posterior cerebral artery territories.  Neuro worsening at 48 hours with CT showing increased cytotoxic cerebral edema, increased hemorrhage  Place central line  Start 3% saline to help prevent increasing cerebral edema  2D Echo  No source of embolus   LDL 63  HgbA1c 6.8 in March 2016  SCDs ordered for VTE prophylaxis Diet NPO time specified  full dose lovenox prior to admission for DVT, now on no antithrombotic given post intervention hemorrhage  Dr. Leonie Man discussed diagnosis, prognosis,   treatment options and plan of care with wife and son given neuro worsening and critical condition Continue ICU level care.    Therapy recommendations:  pending   Disposition:  pending   Respiratory Failure  Intubated for neuro intervention  CCM managing vent  Hold extubation given increased cerebral edema  DVT  Recent dx  On full dose lovenox  Filter placed 6/27 to prevent PE  Essential Hypertension Diastolic HF  Home meds:   Diltiazem, cardura, lasix, metoprolol  Stable  Hyperlipidemia  Home meds:  lipitor 40   LDL 63, goal < 70  Resume statin none able to swallow and continue at discharge  Diabetes  HgbA1c 6.21 August 2014, goal < 7.0  Controlled  Other Stroke Risk Factors  Advanced age  Former Cigarette smoker, quit smoking years ago   ETOH use  Hx stroke/TIA - Hx TIA 1.5 yrs ago  Family hx stroke (father)  Coronary artery disease, MI  Obstructive sleep apnea  Other Active Problems  Mild renal insufficiency 0.9  Hospital day # [employer] for Pager information 12/12/2014 1:53 PM  I have personally examined this patient, reviewed notes, independently viewed imaging studies, participated in medical decision making and plan of care. I have made any additions or clarifications directly to the above note. Agree with note above. The patient's neurological exam was declined today with decreased mental status and increased right hemiparesis likely from increasing cytotoxic edema due to hemorrhagic left MCA infarct. I had a long discussion with the patient's son and wife at the bedside and discussed treatment options including using hypertonic saline to treat edema. There are in agreement and would like to continue aggressive care for the next few days but would like to reassess his situation prior to extubation to make decisions about long-term care  This patient is critically ill and at significant risk of  neurological worsening, death and care requires constant monitoring of vital signs, hemodynamics,respiratory and cardiac monitoring, extensive review of multiple databases, frequent neurological assessment, discussion with family, other specialists and medical decision making of high complexity.I have made any additions or clarifications directly to the above note.This critical care time does not reflect procedure time, or teaching time or supervisory time of PA/NP/Med Resident etc but could involve care discussion time.  I spent 32 minutes of neurocritical care time  in the care of  this patient. D/w Dr Nelda Marseille. Plan repeat head CT in am    Antony Contras, [facility] Pager: [phone] 12/12/2014 1:53 PM   To contact Stroke Continuity provider, please refer to [email or web address] After hours, contact General Neurology Mindi used to going to classes to training?

## 2014-12-12 NOTE — Clinical Documentation Improvement (Signed)
Current documentation notes diastolic heart failure.  Please identify and document the acuity of the heart failure (acute, chronic, acute on chronic) present this admission in your progress note and carry over to the discharge summary.    Thank you, Mateo Flow, RN [phone] Clinical Documentation Specialist

## 2014-12-12 NOTE — Clinical Documentation Improvement (Signed)
Severe malnutrition in context of chronic illness documented by the Registered Dietician on 12/11/2014.  RD notes height  As 6 feet 4 inches, weight as 226 pounds 10.1 ounces; BMI 27.6.  Please document in your progress note if you agree the Registered Dietician's assessment of severe malnutrition.    Possible Clinical Conditions: -Severe malnutrition (as documented by RD) -Malnutrition, other severity (please specify) -Other condition -Unable to determine at present  Thank you, Mateo Flow, RN [phone] Clinical Documentation Specialist

## 2014-12-12 NOTE — Progress Notes (Signed)
CRITICAL VALUE ALERT  Critical value received:  Na: 163  Date of notification:  12/12/2014  Time of notification:  2236  Critical value read back:Yes.    Nurse who received alert:  Eliane Decree, RN   MD notified (1st page):    Time of first page:    MD notified (2nd page):  Time of second page:  Responding MD:   Time MD responded:    Dr. Doy Mince on floor ~2300, notified of Na levels and turning 3% off. Will continue to monitor.

## 2014-12-12 NOTE — Progress Notes (Signed)
PT Cancellation and Discharge Note  Patient Details Name: Paul Dalton MRN: [id number] DOB: Feb 24, 1930   Cancelled Treatment:    Reason Eval/Treat Not Completed: Patient not medically ready.  Note pt continues to be on strict bedrest.  Will sign off of PT and need new order if pt becomes appropriate for PT eval and mobility.     Marilou Barnfield, Thornton Papas 12/12/2014, 8:16 AM

## 2014-12-13 ENCOUNTER — Encounter (HOSPITAL_COMMUNITY): Payer: Self-pay | Admitting: Radiology

## 2014-12-13 ENCOUNTER — Inpatient Hospital Stay (HOSPITAL_COMMUNITY): Payer: Medicare Other

## 2014-12-13 DIAGNOSIS — I639 Cerebral infarction, unspecified: Secondary | ICD-10-CM

## 2014-12-13 LAB — SODIUM
SODIUM: 167 mmol/L — AB (ref 135–145)
SODIUM: 166 mmol/L — AB (ref 135–145)

## 2014-12-13 LAB — CBC
HEMATOCRIT: 30.6 % — AB (ref 39.0–52.0)
Hemoglobin: 9.8 g/dL — ABNORMAL LOW (ref 13.0–17.0)
MCH: 32.6 pg (ref 26.0–34.0)
MCHC: 32 g/dL (ref 30.0–36.0)
MCV: 101.7 fL — ABNORMAL HIGH (ref 78.0–100.0)
Platelets: 129 10*3/uL — ABNORMAL LOW (ref 150–400)
RBC: 3.01 MIL/uL — ABNORMAL LOW (ref 4.22–5.81)
RDW: 16.9 % — ABNORMAL HIGH (ref 11.5–15.5)
WBC: 16.1 10*3/uL — ABNORMAL HIGH (ref 4.0–10.5)

## 2014-12-13 LAB — BLOOD GAS, ARTERIAL
ACID-BASE DEFICIT: 0.5 mmol/L (ref 0.0–2.0)
Bicarbonate: 24.9 mEq/L — ABNORMAL HIGH (ref 20.0–24.0)
Drawn by: 44135
FIO2: 0.3 %
LHR: 20 {breaths}/min
O2 SAT: 93.3 %
PEEP: 5 cmH2O
PO2 ART: 71.5 mmHg — AB (ref 80.0–100.0)
Patient temperature: 98.6
TCO2: 26.4 mmol/L (ref 0–100)
VT: 550 mL
pCO2 arterial: 50.6 mmHg — ABNORMAL HIGH (ref 35.0–45.0)
pH, Arterial: 7.313 — ABNORMAL LOW (ref 7.350–7.450)

## 2014-12-13 LAB — BASIC METABOLIC PANEL
BUN: 33 mg/dL — AB (ref 6–20)
CO2: 26 mmol/L (ref 22–32)
Calcium: 8.7 mg/dL — ABNORMAL LOW (ref 8.9–10.3)
Chloride: >130 mmol/L (ref 101–111)
Creatinine, Ser: 0.93 mg/dL (ref 0.61–1.24)
GFR calc Af Amer: >60 mL/min (ref >60)
GFR calc non Af Amer: >60 mL/min (ref >60)
GLUCOSE: 285 mg/dL — AB (ref 65–99)
Potassium: 3.1 mmol/L — ABNORMAL LOW (ref 3.5–5.1)
SODIUM: 165 mmol/L — AB (ref 135–145)

## 2014-12-13 LAB — GLUCOSE, CAPILLARY
GLUCOSE-CAPILLARY: 176 mg/dL — AB (ref 65–99)
GLUCOSE-CAPILLARY: 221 mg/dL — AB (ref 65–99)
GLUCOSE-CAPILLARY: 252 mg/dL — AB (ref 65–99)
GLUCOSE-CAPILLARY: 293 mg/dL — AB (ref 65–99)
Glucose-Capillary: 224 mg/dL — ABNORMAL HIGH (ref 65–99)
Glucose-Capillary: 274 mg/dL — ABNORMAL HIGH (ref 65–99)

## 2014-12-13 LAB — MAGNESIUM: MAGNESIUM: 2.7 mg/dL — AB (ref 1.7–2.4)

## 2014-12-13 LAB — PHOSPHORUS: Phosphorus: 1.9 mg/dL — ABNORMAL LOW (ref 2.5–4.6)

## 2014-12-13 NOTE — Progress Notes (Signed)
Notified by nursing that patient now unable to move the left sided.  Last exam prior to change was 0315.  Stat head CT performed and shows no worsening of bleed, infarcts or midline shift.  Concern remains for new infarct.  Recommendations: 1. MRI of brain.  Alexis Goodell, MD [facility] [phone]

## 2014-12-13 NOTE — Progress Notes (Signed)
Assessing pt. Family expresses plan for pallative and terminal extubation 7/1. Discussed with this nurse goals of care for the night. Family wishes this nurse to cease thorough neuro checks, lab draws, frequent vitals, etc. Only desire nurse to assist in making pt comfortable with continuing sedation meds, turns, and mouth care. Acknowledge family's wishes. Informed [facility] of family's wishes.

## 2014-12-13 NOTE — Progress Notes (Signed)
eLink Physician-Brief Progress Note Patient Name: GOERGE MOHR DOB: [birth year] MRN: [id number]  Date of Service  12/13/2014   HPI/Events of Note  HR 115, wide complex, normal bp/pulse - rn concerned. On camera looks like LBBB  - old ekg march 2016 LBBB  Patient is dnar,  For terminal wean 01-07-15   eICU Interventions  Nil interventio for baseline rhytym + likely terminal wean +  DNR   Intervention Category Intermediate Interventions: Hyperglycemia - evaluation and treatment  Anton Cheramie 12/13/2014, 6:35 PM

## 2014-12-13 NOTE — Progress Notes (Signed)
STROKE TEAM PROGRESS NOTE   HISTORY Paul Dalton is an 81 y.o. male with multiple medical including HTN, hyperlipidemia, atrial fibrillation off anticoagulation, CAD, s/p pacemaker placement, TIA's, AAA, DVT on fullk dose Lovenox, OSA, brought in via EMS for evaluation of sudden onset expressive aphasia. Wife is at the bedside. He was home today 12/09/3014, ate lunch, and subsequently developed sudden onset of inability to speak and right sided weakness at 1300 (LKW). EMS was summoned and patient brought to the ED where he was alert and awake with NIHSS 22. CT brain showed no acute abnormality. Patient administered his dose of Lovenox this morning. NIHSS: 22. Patient was not administered TPA secondary to no intravenous thrombolysis was given as patient received Lovenox few hours before presentation. Taken to the interventional suite for angiogram and had Complete revascularization of angular branch ,and partial revascularization of the frontal branch with 7.5 mg of superselective IATPA. Marland Kitchen He was admitted to the neuro ICU for further evaluation and treatment.   SUBJECTIVE (INTERVAL HISTORY) His wife and son are at the bedside.  He remains intubated this am. Patient drowsy this am. On hypertonic saline and last sodium is  above goal at 167 and hypertonic saline is on hold. OBJECTIVE Temp:  [98.5 F (36.9 C)-100.3 F (37.9 C)] 98.9 F (37.2 C) (06/30 1120) Pulse Rate:  [71-102] 94 (06/30 1350) Cardiac Rhythm:  [-] Ventricular paced (06/30 0800) Resp:  [21-30] 27 (06/30 1350) BP: (130-165)/(58-93) 160/75 mmHg (06/30 1350) SpO2:  [97 %-100 %] 100 % (06/30 1350) FiO2 (%):  [30 %] 30 % (06/30 1350) Weight:  [219 lb 2.2 oz (99.4 kg)] 219 lb 2.2 oz (99.4 kg) (06/30 0400)   Recent Labs Lab 12/12/14 1950 12/13/14 0030 12/13/14 0348 12/13/14 0748 12/13/14 1119  GLUCAP 253* 293* 224* 176* 221*    Recent Labs Lab 12/06/2014 1425 12/12/2014 1436 12/10/14 0610 12/11/14 0240  12/12/14 0411  12/12/14 0930 12/12/14 1500 12/12/14 2140 12/13/14 0405 12/13/14 1123  NA 138 137 139 140  < > 152* 153* 146* 163* 165* 167*  K 4.4 4.3 4.0 4.1  --  3.8  --   --   --  3.1*  --   CL 100* 101 106 106  --  119*  --   --   --  >130*  --   CO2 27  --  26 26  --  25  --   --   --  26  --   GLUCOSE 244* 244* 145* 170*  --  259*  --   --   --  285*  --   BUN 26* 28* 19 14  --  22*  --   --   --  33*  --   CREATININE 1.23 1.10 0.90 0.88  --  0.87  --   --   --  0.93  --   CALCIUM 9.3  --  8.7* 8.4*  --  8.6*  --   --   --  8.7*  --   MG  --   --   --  1.9  --  2.4  --   --   --  2.7*  --   PHOS  --   --   --  3.0  --  2.2*  --   --   --  1.9*  --   < > = values in this interval not displayed.  Recent Labs Lab 12/10/2014 1425  AST 60*  ALT 69*  ALKPHOS  207*  BILITOT 0.9  PROT 6.7  ALBUMIN 3.5    Recent Labs Lab 12/11/2014 1425 11/21/2014 1436 12/10/14 0610 12/11/14 0240 12/12/14 0411 12/13/14 0405  WBC 8.0  --  9.6 13.4* 17.6* 16.1*  NEUTROABS 5.6  --  7.8*  --   --   --   HGB 12.6* 13.6 11.1* 11.3* 9.7* 9.8*  HCT 36.4* 40.0 32.8* 34.1* 30.0* 30.6*  MCV 95.5  --  96.2 98.0 100.0 101.7*  PLT 179  --  174 122* 130* 129*   No results for input(s): CKTOTAL, CKMB, CKMBINDEX, TROPONINI in the last 168 hours. No results for input(s): LABPROT, INR in the last 72 hours. No results for input(s): COLORURINE, LABSPEC, [place], GLUCOSEU, HGBUR, BILIRUBINUR, KETONESUR, PROTEINUR, UROBILINOGEN, NITRITE, LEUKOCYTESUR in the last 72 hours.  Invalid input(s): APPERANCEUR     Component Value Date/Time   CHOL 124 12/10/2014 0610   TRIG 156* 12/10/2014 0610   HDL 30* 12/10/2014 0610   CHOLHDL 4.1 12/10/2014 0610   VLDL 31 12/10/2014 0610   LDLCALC 63 12/10/2014 0610   Lab Results  Component Value Date   HGBA1C 7.8* 12/10/2014   No results found for: LABOPIA, COCAINSCRNUR, LABBENZ, AMPHETMU, THCU, LABBARB  No results for input(s): ETH in the last 168 hours.  Ct Head Wo  Contrast  12/13/2014   CLINICAL DATA:  Narrow changes. History of headache, stroke, diabetes, hypertension.  EXAM: CT HEAD WITHOUT CONTRAST  TECHNIQUE: Contiguous axial images were obtained from the base of the skull through the vertex without intravenous contrast.  COMPARISON:  CT head December 10, 2014  FINDINGS: Evolving 3.4 x 4.5 cm LEFT temporal parietal hematoma was 3.5 x 5 cm, small satellite hemorrhages, in a background of LEFT temporal parietal, LEFT occipital and LEFT frontal wedge-like loss of the gray-white matter differentiation and sulcal effacement. Faint probable petechial hemorrhage within LEFT frontal lobe infarct. 4 mm LEFT to RIGHT midline shift, previously 3 mm. Partially effaced LEFT lateral ventricle, no entrapment/ hydrocephalus. LEFT basal ganglia infarct. Basal cisterns are patent.  No abnormal extra-axial fluid collections. Mild calcific atherosclerosis of the carotid siphons. No skull fracture. Mild paranasal sinus mucosal thickening without air-fluid levels. The mastoid air cells are well aerated. Ocular globes and orbital contents are unremarkable.  IMPRESSION: Evolving LEFT temporal parietal intraparenchymal hematoma with small satellite hemorrhages, relatively stable.  Evolving LEFT frontal, LEFT temporal, LEFT parietal and LEFT occipital lobe infarcts spanning LEFT middle and LEFT posterior cerebral artery territories.  4 mm LEFT-to-RIGHT midline shift without ventricle entrapment.   Electronically Signed   By: Elon Alas M.D.   On: 12/13/2014 05:32   Dg Chest Port 1 View  12/13/2014   CLINICAL DATA:  Hypoxia  EXAM: PORTABLE CHEST - 1 VIEW  COMPARISON:  December 12, 2014  FINDINGS: Endotracheal tube tip is 5.9 cm above the carina. Nasogastric tube tip and side port are below the diaphragm. Central catheter tip is in the superior cava. Pacemaker leads are attached to the right atrium and right ventricle. No pneumothorax. Patchy airspace opacity in the lung bases is stable. Lungs  elsewhere clear. There is mild cardiomegaly with small bilateral effusions. No appreciable adenopathy. There is atherosclerotic change in the aorta.  IMPRESSION: Tube and catheter positions as described without pneumothorax. Patchy bibasilar edema versus pneumonia. Both entities may exist concurrently. Cardiomegaly with small effusions suggests that there is a degree of underlying congestive heart failure.   Electronically Signed   By: Lowella Grip III M.D.   On: 12/13/2014  08:05   Dg Chest Port 1 View  12/12/2014   CLINICAL DATA:  Endotracheal tube  EXAM: PORTABLE CHEST - 1 VIEW  COMPARISON:  12/11/2014  FINDINGS: Right pacer, left central line, endotracheal tube and NG tube remain in place, unchanged. There is cardiomegaly. Bilateral lower lobe airspace opacities have increased since prior study. No visible significant effusions. No acute bony abnormality.  IMPRESSION: Cardiomegaly.  Increasing bilateral lower lobe opacities. Cannot exclude pneumonia.   Electronically Signed   By: Rolm Baptise M.D.   On: 12/12/2014 08:00   Cerebral Angiogram 1occluded prominent angular branch of LT MCA andfrontal branch of superior division. Complete revascularization of angular branch ,and partial revascularization of the frontal branch with 7.5 mg of superselective IATPA  2D Echocardiogram   - Left ventricle: The cavity size was normal. Systolic function wasnormal. The estimated ejection fraction was in the range of 55%to 60%. Wall motion was normal; there were no regional wallmotion abnormalities. - Aortic valve: There was moderate stenosis. There was mildregurgitation. Valve area (VTI): 1.28 cm^2. Valve area (Vmax):1.19 cm^2. Valve area (Vmean): 1.24 cm^2. - Mitral valve: There was mild regurgitation. - Left atrium: The atrium was mildly dilated. - Right atrium: The atrium was moderately to severely dilated. - Tricuspid valve: There was moderate regurgitation. - Pulmonary arteries: PA peak pressure: 55  mm Hg (S).   PHYSICAL EXAM Elderly caucasian male not in distress. . Afebrile. Head is nontraumatic. Neck is supple without bruit.    Cardiac exam no murmur or gallop. Lungs are clear to auscultation. Distal pulses are well felt. He has right groin arterial sheath Neurological Exam : Patient is intubated. drowsy and  arousable. Pupils are 4 mm equal reactive. Fundi were not visualized. Left gaze preference.   Follows occasional midline commands.. Right lower facial weakness. Tongue is midline. Extraocular movements are full range without nystagmus or restriction. Right hemiparesis but unable to move her right hand and lower extremity at all.Trace RLE withdrawal to pain. Normal antigravity movements on the left side. Diminished sensation on the right compared to the left. Right plantar upgoing left downgoing. Gait was not tested.    ASSESSMENT/PLAN Paul Dalton is a 81 y.o. male with history of CAD, DM, prior TIA, and seizure in March of 2016, found to have a DVT within 1 week and started on lovenox 100 mg BID presenting with sudden onset of aphasia. He did not receive IV t-PA due to being on full dose lovenox, but was taken to neurointervention for angio, s/p complete revascularization of L MCA angular branch and partial revascularization of the frontal branch with 7.5 mg of superselective IA TPA.  Stroke:  Left MCA and PCA territory infarcts s/p partial revascularization w/ neurointervenion now with L temporal parietal and satellite hematomas, infarct likely embolic secondary to unknown etiology. Hematoma secondary to IA, now symptomatic. tPA/intervention  Resultant  VDRF, R hemiparesis  Cerebral angio occluded prominent angular branch of LT MCA  CT  Post intervention 5 x 2.7 cm acute LEFT temporal parietal hematoma with small satellite hematomas.  Acute LEFT frontal, LEFT temporal, LEFT parietal and LEFT occipital lobe infarcts spanning LEFT middle cerebral and LEFT posterior cerebral artery  territories.  Neuro worsening at 48 hours with CT showing increased cytotoxic cerebral edema, increased hemorrhage   3% saline now on hold forcerebral edema due to sodium 167  2D Echo  No source of embolus   LDL 63  HgbA1c 6.8 in March 2016  SCDs ordered for VTE prophylaxis Diet  NPO time specified  full dose lovenox prior to admission for DVT, now on no antithrombotic given post intervention hemorrhage  Dr. Leonie Man discussed diagnosis, prognosis,  treatment options and plan of care with wife and son given neuro worsening and critical condition Continue ICU level care.    Therapy recommendations:  pending   Disposition:  pending   Respiratory Failure   Intubated for neuro intervention  CCM managing vent  Hold extubation given increased cerebral edema  DVT  Recent dx  On full dose lovenox  Filter placed 6/27 to prevent PE  Essential Hypertension Diastolic HF  Home meds:   Diltiazem, cardura, lasix, metoprolol  Stable  Hyperlipidemia  Home meds:  lipitor 40   LDL 63, goal < 70  Resume statin none able to swallow and continue at discharge  Diabetes  HgbA1c 6.21 August 2014, goal < 7.0  Controlled  Other Stroke Risk Factors  Advanced age  Former Cigarette smoker, quit smoking years ago   ETOH use  Hx stroke/TIA - Hx TIA 1.5 yrs ago  Family hx stroke (father)  Coronary artery disease, MI  Obstructive sleep apnea  Other Active Problems  Mild renal insufficiency 0.9  Severe malnutrition  Hospital day # [place] for Pager information 12/13/2014 2:26 PM  I have personally examined this patient, reviewed notes, independently viewed imaging studies, participated in medical decision making and plan of care. I have made any additions or clarifications directly to the above note. Agree with note above. The patient's neurological exam has declined  with decreased mental status and increased right hemiparesis  likely from increasing cytotoxic edema due to hemorrhagic left MCA infarct. I had a long discussion with the patient's son and wife at the bedside . They understand that the patient is likely need to have prolonged ventilatory support followed by possibly tracheostomy and PEG tube and nursing home placement and they feel patient has clearly expressed his desire in the past with them that he would not want any of this. They are now leaning towards comfort care but would like rest of the family to come and visit him before they make that decision  This patient is critically ill and at significant risk of neurological worsening, death and care requires constant monitoring of vital signs, hemodynamics,respiratory and cardiac monitoring, extensive review of multiple databases, frequent neurological assessment, discussion with family, other specialists and medical decision making of high complexity.I have made any additions or clarifications directly to the above note.This critical care time does not reflect procedure time, or teaching time or supervisory time of PA/NP/Med Resident etc but could involve care discussion time.  I spent 30 minutes of neurocritical care time  in the care of  this patient. D/w Dr Nelda Marseille.     Antony Contras, MD Medical Director [facility] Pager: [phone] 12/13/2014 2:26 PM   To contact Stroke Continuity provider, please refer to [email or web address] After hours, contact General Neurology Mindi used to going to classes to training?

## 2014-12-13 NOTE — Progress Notes (Signed)
eLink Physician-Brief Progress Note Patient Name: Paul Dalton DOB: 1929-07-10 MRN: [id number]   Date of Service  12/13/2014  HPI/Events of Note  Family asking for avoiding sticks etc., anticipation of terminal wean January 10, 2015 `  eICU Interventions  Dc ccbg and other superfluous orders     Intervention Category Evaluation Type: Other  Telesforo Brosnahan 12/13/2014, 9:28 PM

## 2014-12-13 NOTE — Progress Notes (Signed)
FMLA paperwork completed and signed by MD for daughters Pamala Hurry and Judeen Hammans.  Case Mgr faxed to daughters' employers, as a Manufacturing engineer.  They are appreciative of help.    Reinaldo Raddle, RN, BSN  Trauma/Neuro ICU Case Manager [phone]

## 2014-12-13 NOTE — Progress Notes (Signed)
PULMONARY  / CRITICAL CARE MEDICINE CONSULTATION   Name: Paul Dalton MRN: [id number] DOB: 14-Oct-1929    ADMISSION DATE:  12/03/2014 CONSULTATION DATE: December 13, 2014  REQUESTING CLINICIAN: Garvin Fila, MD PRIMARY SERVICE: Neurology  CHIEF COMPLAINT:  Sudden onset aphasia  BRIEF PATIENT DESCRIPTION: 81 y/o man with multiple risk factors for stroke on Lovenox for treatment of DVT who presented with left MCA stroke s/p revascularization and catheter directed TPA.  SIGNIFICANT EVENTS / STUDIES:  11/30/2014 - underwent intervention of acute L MCA stroke with 7.5 mg of superselective catheter-directed tPA.  LINES / TUBES: 8.0 ETT - 6/26 R fem Art line - 6/26 L fem arterial sheath - 6/26 Foley - 6/26  CULTURES: none  ANTIBIOTICS: Ancef x1 6/26  HISTORY OF PRESENT ILLNESS:  Paul Dalton is a 81 year old man with a history of CAD, DM, prior TIA, and seizure in March of 2016. He was found to have a DVT within a week of his presentation and was started on lovenox, 100 mg BID. He was brought to the ED on 6/26 after developing sudden onset aphasia and R sided weakness. He had a NIHSS of 22 and a head CT did not demonstrate any hemorrhage. Due to his anticoagulated state, tPA was not given, but he was taken by IR for catheter intervention where he had an angular branch of his L MCA occluded, as well as frontal branch of a superior division of the same. He had complete revascularization of the angular branch, and partial revascularization of the frontal branch. He was brought to the ICU and remain intubated. PCCM was consulted for critical care management.  SUBJECTIVE: Awake and interactive.  VITAL SIGNS: Temp:  [98.5 F (36.9 C)-100.3 F (37.9 C)] 98.5 F (36.9 C) (06/30 0749) Pulse Rate:  [70-102] 93 (06/30 0900) Resp:  [20-30] 24 (06/30 0900) BP: (130-165)/(58-81) 152/81 mmHg (06/30 0900) SpO2:  [97 %-100 %] 100 % (06/30 0900) FiO2 (%):  [30 %] 30 % (06/30 0725) Weight:  [99.4 kg (219  lb 2.2 oz)] 99.4 kg (219 lb 2.2 oz) (06/30 0400) HEMODYNAMICS:   VENTILATOR SETTINGS: Vent Mode:  [-] PSV;CPAP FiO2 (%):  [30 %] 30 % Set Rate:  [20 bmp] 20 bmp Vt Set:  [550 mL] 550 mL PEEP:  [5 cmH20] 5 cmH20 Pressure Support:  [8 cmH20-10 cmH20] 10 cmH20 Plateau Pressure:  [11 cmH20-12 cmH20] 11 cmH20 INTAKE / OUTPUT: Intake/Output      06/29 0701 - 06/30 0700 06/30 0701 - 07/01 0700   I.V. (mL/kg) 1938.9 (19.5)    NG/GT 930    IV Piggyback     Total Intake(mL/kg) 2868.9 (28.9)    Urine (mL/kg/hr) 2740 (1.1)    Total Output 2740     Net +128.9           PHYSICAL EXAMINATION: General:  Elderly man, intubated, sluggish but follows some commands on the left. Neuro:  Arousable, following some commands on the left but right sided hemiparesis. HEENT:  [place]/AT, PERRL, EOM-I and MMM Neck: No masses, -JVD. Cardiovascular:  RRR, Nl S1/S2, -M/R/G. Lungs:  CTA bilaterally Abdomen:  Soft, NT, ND and +BS. Musculoskeletal:  No deformities. Skin:  Scattered ecchymoses.  LABS:  CBC  Recent Labs Lab 12/11/14 0240 12/12/14 0411 12/13/14 0405  WBC 13.4* 17.6* 16.1*  HGB 11.3* 9.7* 9.8*  HCT 34.1* 30.0* 30.6*  PLT 122* 130* 129*   Coag's  Recent Labs Lab 12/08/2014 1425  APTT 38*  INR 1.29  BMET  Recent Labs Lab 12/11/14 0240  12/12/14 0411  12/12/14 1500 12/12/14 2140 12/13/14 0405  NA 140  < > 152*  < > 146* 163* 165*  K 4.1  --  3.8  --   --   --  3.1*  CL 106  --  119*  --   --   --  >130*  CO2 26  --  25  --   --   --  26  BUN 14  --  22*  --   --   --  33*  CREATININE 0.88  --  0.87  --   --   --  0.93  GLUCOSE 170*  --  259*  --   --   --  285*  < > = values in this interval not displayed. Electrolytes  Recent Labs Lab 12/11/14 0240 12/12/14 0411 12/13/14 0405  CALCIUM 8.4* 8.6* 8.7*  MG 1.9 2.4 2.7*  PHOS 3.0 2.2* 1.9*   Sepsis Markers No results for input(s): LATICACIDVEN, PROCALCITON, O2SATVEN in the last 168 hours.   ABG  Recent Labs Lab  12/11/14 0435 12/12/14 0448 12/13/14 0545  PHART 7.418 7.348* 7.313*  PCO2ART 39.0 44.5 50.6*  PO2ART 99.3 106* 71.5*   Liver Enzymes  Recent Labs Lab 11/22/2014 1425  AST 60*  ALT 69*  ALKPHOS 207*  BILITOT 0.9  ALBUMIN 3.5   Cardiac Enzymes No results for input(s): TROPONINI, PROBNP in the last 168 hours. Glucose  Recent Labs Lab 12/12/14 1146 12/12/14 1535 12/12/14 1950 12/13/14 0030 12/13/14 0348 12/13/14 0748  GLUCAP 256* 266* 253* 293* 224* 176*   Imaging Ct Head Wo Contrast  12/13/2014   CLINICAL DATA:  Narrow changes. History of headache, stroke, diabetes, hypertension.  EXAM: CT HEAD WITHOUT CONTRAST  TECHNIQUE: Contiguous axial images were obtained from the base of the skull through the vertex without intravenous contrast.  COMPARISON:  CT head December 10, 2014  FINDINGS: Evolving 3.4 x 4.5 cm LEFT temporal parietal hematoma was 3.5 x 5 cm, small satellite hemorrhages, in a background of LEFT temporal parietal, LEFT occipital and LEFT frontal wedge-like loss of the gray-white matter differentiation and sulcal effacement. Faint probable petechial hemorrhage within LEFT frontal lobe infarct. 4 mm LEFT to RIGHT midline shift, previously 3 mm. Partially effaced LEFT lateral ventricle, no entrapment/ hydrocephalus. LEFT basal ganglia infarct. Basal cisterns are patent.  No abnormal extra-axial fluid collections. Mild calcific atherosclerosis of the carotid siphons. No skull fracture. Mild paranasal sinus mucosal thickening without air-fluid levels. The mastoid air cells are well aerated. Ocular globes and orbital contents are unremarkable.  IMPRESSION: Evolving LEFT temporal parietal intraparenchymal hematoma with small satellite hemorrhages, relatively stable.  Evolving LEFT frontal, LEFT temporal, LEFT parietal and LEFT occipital lobe infarcts spanning LEFT middle and LEFT posterior cerebral artery territories.  4 mm LEFT-to-RIGHT midline shift without ventricle entrapment.    Electronically Signed   By: Paul Dalton M.D.   On: 12/13/2014 05:32   Dg Chest Port 1 View  12/13/2014   CLINICAL DATA:  Hypoxia  EXAM: PORTABLE CHEST - 1 VIEW  COMPARISON:  December 12, 2014  FINDINGS: Endotracheal tube tip is 5.9 cm above the carina. Nasogastric tube tip and side port are below the diaphragm. Central catheter tip is in the superior cava. Pacemaker leads are attached to the right atrium and right ventricle. No pneumothorax. Patchy airspace opacity in the lung bases is stable. Lungs elsewhere clear. There is mild cardiomegaly with small bilateral effusions. No  appreciable adenopathy. There is atherosclerotic change in the aorta.  IMPRESSION: Tube and catheter positions as described without pneumothorax. Patchy bibasilar edema versus pneumonia. Both entities may exist concurrently. Cardiomegaly with small effusions suggests that there is a degree of underlying congestive heart failure.   Electronically Signed   By: Lowella Grip III M.D.   On: 12/13/2014 08:05   Dg Chest Port 1 View  12/12/2014   CLINICAL DATA:  Endotracheal tube  EXAM: PORTABLE CHEST - 1 VIEW  COMPARISON:  12/11/2014  FINDINGS: Right pacer, left central line, endotracheal tube and NG tube remain in place, unchanged. There is cardiomegaly. Bilateral lower lobe airspace opacities have increased since prior study. No visible significant effusions. No acute bony abnormality.  IMPRESSION: Cardiomegaly.  Increasing bilateral lower lobe opacities. Cannot exclude pneumonia.   Electronically Signed   By: Rolm Baptise M.D.   On: 12/12/2014 08:00   Dg Chest Port 1 View  12/11/2014   CLINICAL DATA:  Central line and orogastric tube placement.  EXAM: PORTABLE CHEST - 1 VIEW  COMPARISON:  12/11/2014  FINDINGS: There has been an interval placement of central venous catheter from the left subclavian approach, tip overlying the expected location of the cavoatrial junction. There has also been placement of an enteric catheter, with tip  collimated off the image. Endotracheal tube is unchanged and in satisfactory position. Dual lead cardiac pacemaker is also unchanged.  Cardiomediastinal silhouette is stably enlarged. The aorta is torturous and contains atherosclerotic calcifications.  There is no evidence of focal airspace consolidation, pleural effusion or pneumothorax. Coarsening of the interstitial markings is again noted, likely due to chronic interstitial changes. There is probable left lung base subsegmental atelectasis.  Osseous structures are without acute abnormality. Soft tissues are grossly normal.  IMPRESSION: Status post central line placement.  No evidence of pneumothorax.  Enteric catheter placement, tip collimated off the image.  Chronic interstitial lung changes, and left lower lobe atelectasis.   Electronically Signed   By: Fidela Salisbury M.D.   On: 12/11/2014 12:00   Dg Abd Portable 1v  12/11/2014   CLINICAL DATA:  Orogastric tube placement  EXAM: PORTABLE ABDOMEN - 1 VIEW  COMPARISON:  12/10/2014  FINDINGS: Nonobstructive bowel gas pattern with moderate levoscoliosis lumbar spine and significant aortic calcification. Orogastric tube is identified in the position suggesting that it lies along the greater curvature of the stomach with tip in the mid to distal body of the stomach. Bilateral flank calcifications suggesting renal calculi.  IMPRESSION: Orogastric tube as described   Electronically Signed   By: Skipper Cliche M.D.   On: 12/11/2014 12:19   Ir Percutaneous Art Thrombectomy/infusion Intracranial Inc Diag Angio  12/11/2014   CLINICAL DATA:  81 year old gentleman with a history of cerebral vascular accident, with lower extremity DVT. He is not a candidate for anti coagulation medication.  An IVC filter is indicated for placement.  EXAM: ULTRASOUND GUIDANCE FOR VASCULAR ACCESS  IVC CATHETERIZATION AND VENOGRAM  IVC FILTER INSERTION  Date:  11/30/2014; 6/27/20166/26/2016 5:55 pm; 12/10/2014 3:25 pm  FLUOROSCOPY  TIME:  1 minutes  MEDICATIONS AND MEDICAL HISTORY: No sedation  ANESTHESIA/SEDATION: None  CONTRAST:  27mL OMNIPAQUE IOHEXOL 300 MG/ML SOLN; 4mL OMNIPAQUE IOHEXOL 300 MG/ML SOLN  COMPLICATIONS: None  PROCEDURE: Informed consent was obtained from the patient following explanation of the procedure, risks, benefits and alternatives. The patient understands, agrees and consents for the procedure. All questions were addressed. A time out was performed.  Maximal barrier sterile technique utilized including caps,  mask, sterile gowns, sterile gloves, large sterile drape, hand hygiene, and betadine prep.  Ultrasound survey was performed of the right jugular region with images stored and sent to PACs.  Under sterile condition and local anesthesia, right internal jugular venous access was performed with ultrasound. Over a guide wire, the IVC filter delivery sheath and inner dilator were advanced into the IVC just above the IVC bifurcation. Contrast injection was performed for an IVC venogram.  A jugular approach Denali retrievable filter was then placed into the IVC, below the bilateral renal veins, and below the widest segment of the IVC.  FINDINGS: IVC VENOGRAM: The IVC is patent. No evidence of thrombus, stenosis, or occlusion. No variant venous anatomy. The renal veins are identified at L1-L2.  The diameter of the IVC just below the renal veins measured greater than the largest diameter compatible with indication for use for this particular filter, at least 29 mm.  The segment of IVC with a diameter compatible with this filter was at the level of L4, above the iliac vein inflow.  IVC FILTER INSERTION: Through the delivery sheath, the [facility] retrieval IVC filter was deployed in the infrarenal IVC at the L4 level below the renal veins and above the IVC bifurcation. Contrast injection confirmed position. There is good apposition of the filter against the IVC.  The delivery sheath was removed and hemostasis was obtained with  compression for 5 minutes. The patient tolerated the procedure well. No immediate complications.  IMPRESSION: Status post IV C filter placement via right internal jugular approach, below the renal veins at the most narrow segment of the infrarenal IVC.  This IVC filter is potentially retrievable. The patient may be assessed for filter retrieval by Interventional Radiology in approximately 8-12 weeks. Further recommendations regarding filter retrieval, continued surveillance or declaration of device permanence, can be made at that time.  Signed,  Dulcy Fanny. Earleen Newport, DO  [facility]  [facility]   Electronically Signed   By: Corrie Mckusick D.O.   On: 12/10/2014 17:52    EKG: V-paced rhythm. CXR: tube high  ASSESSMENT / PLAN:  PULMONARY A: Need for Mechanical Ventilation due to procedure Had DVTs diagnosed 2 wks ago P:   Maintain full vent support. Likely withdrawal in AM when family is ready. VAP prevention bundle. IVC filter in place.  CARDIOVASCULAR A: Hypertension Diastolic HF P:   Will maintain goal SBP >120, <140. Likely withdrawal in AM. Hold anticoag for at least one month.  RENAL A: Mild renal insuffiency,  P:   Will avoid further nephrotoxic drugs Will not order further blood draws at this point. Replace electrolytes as indicated.  GASTROINTESTINAL A: No active issues. P:   TF per nutrition. PPI.  HEMATOLOGIC A: Recent DVT P:   Hold lovenox for now given recent tPA and secondary hemorrhage.  IVC filter for one month then remove and restart anti-coag.  INFECTIOUS A: No active issues. P:   Monitor WBC and fever curve.  ENDOCRINE A: DM 2 P:   Moderate SSI ordered  NEUROLOGIC A: Acute L MCA stroke P:   S/p intervention. Defer to primary team for additional management. CT in AM, maintain careful BP control o/n  Spoke with wife at length, the family is coming in and will likely withdraw in AM and change to  DNR.  The patient is critically ill with multiple organ systems failure and requires high complexity decision making for assessment and support, frequent evaluation and titration of therapies, application of  advanced monitoring technologies and extensive interpretation of multiple databases.   Critical Care Time devoted to patient care services described in this note is  35  Minutes. This time reflects time of care of this signee Dr Jennet Maduro. This critical care time does not reflect procedure time, or teaching time or supervisory time of PA/NP/Med student/Med Resident etc but could involve care discussion time.  Rush Farmer, M.D. [facility] Pulmonary/Critical Care Medicine. Pager: [phone]. After hours pager: [phone].

## 2014-12-13 NOTE — Progress Notes (Signed)
Noted slight flicker to LUE to pain, BLE withdrawal to pain, and continue to have no movement to RUE. Pupils continue to be about 3cm and brisk. Called Dr. Doy Mince, notified of change, order for Stat head CT

## 2014-12-14 DIAGNOSIS — R4182 Altered mental status, unspecified: Secondary | ICD-10-CM

## 2014-12-14 DIAGNOSIS — Z515 Encounter for palliative care: Secondary | ICD-10-CM

## 2014-12-14 MED ORDER — MORPHINE BOLUS VIA INFUSION
5.0000 mg | INTRAVENOUS | Status: DC | PRN
  Filled 2014-12-14: qty 20

## 2014-12-14 MED ORDER — MORPHINE SULFATE 25 MG/ML IV SOLN
10.0000 mg/h | INTRAVENOUS | Status: DC
  Administered 2014-12-14: 10 mg/h via INTRAVENOUS
  Filled 2014-12-14: qty 10

## 2014-12-14 DEATH — deceased

## 2014-12-16 DIAGNOSIS — I259 Chronic ischemic heart disease, unspecified: Secondary | ICD-10-CM | POA: Diagnosis not present

## 2014-12-16 DIAGNOSIS — I10 Essential (primary) hypertension: Secondary | ICD-10-CM | POA: Diagnosis not present

## 2014-12-16 DIAGNOSIS — I209 Angina pectoris, unspecified: Secondary | ICD-10-CM | POA: Diagnosis not present

## 2014-12-18 NOTE — Discharge Summary (Addendum)
Patient ID: Paul Dalton MRN: [id number] DOB/AGE: August 27, 1929 81 y.o.  Admit date: 05-Jan-2015 Death date: 2015-01-10 1:15  Admission Diagnoses: Aphasia  Cause of Death:  Large left MCA and PCA infarcts secondary to left MCA occlusion treated with intra-arterial TPA and mechanical embolectomy complicated by hemorrhagic transformation with cytotoxic cerebral edema, brain herniation. Patient made DO NOT RESUSCITATE and comfort care upon family's request  Pertinent Medical Diagnosis: Active Problems:   Stroke with cerebral ischemia   DVT (deep venous thrombosis)   Acute respiratory failure with hypoxemia   Altered mental status   Cytotoxic brain edema Brain herniation Intracerebral hematoma Chronic diastolic heart failure Severe malnutrition   Hospital Course:   Paul Dalton is an 81 y.o. male with multiple medical including HTN, hyperlipidemia, atrial fibrillation off anticoagulation, CAD, s/p pacemaker placement, TIA's, AAA, DVT on fullk dose Lovenox, OSA, brought in via EMS for evaluation of sudden onset expressive aphasia. Wife is at the bedside. He was home today 01-05-3015, ate lunch, and subsequently developed sudden onset of inability to speak and right sided weakness at 1300 (LKW). EMS was summoned and patient brought to the ED where he was alert and awake with NIHSS 22. CT brain showed no acute abnormality. Patient administered his dose of Lovenox this morning. NIHSS: 22. Patient was not administered TPA secondary to no intravenous thrombolysis was given as patient received Lovenox few hours before presentation. Taken to the interventional suite for angiogram and had Complete revascularization of angular branch ,and partial revascularization of the frontal branch with 7.5 mg of superselective IATPA. Marland Kitchen He was admitted to the neuro ICU for further evaluation and treatment. Patient did not show significant neurological improvement and remained aphasic with hemiplegia. Follow-up brain  imaging showed increasing left temporal hematoma with significant cytotoxic edema and mild midline shift. Follow-up imaging studies showed increasing cytotoxic edema and it became clear that the patient would require prolonged ventilatory support and likely tracheostomy, PEG tube and nursing home placement and have a life of significant neurological disability. After discussion with patient's family members they felt patient would not have wanted this. They hence wanted to withdraw life support and make the patient DO NOT RESUSCITATE and comfort care. This was done upon family's request and patient kept comfortable on morphine. His condition gradually declined and he was found to be pulseless and not breathing and pronounced at by the RN on 10-Jan-2015 at 1:15 AM  Cerebral angio occluded prominent angular branch of LT MCA  CT Post intervention 5 x 2.7 cm acute LEFT temporal parietal hematoma with small satellite hematomas. Acute LEFT frontal, LEFT temporal, LEFT parietal and LEFT occipital lobe infarcts spanning LEFT middle cerebral and LEFT posterior cerebral artery territories.  Neuro worsening at 48 hours with CT showing increased cytotoxic cerebral edema, increased hemorrhage  2D Echo No source of embolus   LDL 63  HgbA1c 6.8 in March 2016 I have personally examined this patient, reviewed notes, independently viewed imaging studies, participated in medical decision making and plan of care. I have made any additions or clarifications directly to the above note. Agree with note above.   Antony Contras, MD Medical Director [facility] Pager: [phone] 12/26/2014 3:21 PM  Signed: Antony Contras 12/18/2014, 3:26 PM

## 2015-01-14 NOTE — Progress Notes (Signed)
STROKE TEAM PROGRESS NOTE   HISTORY Paul Dalton is an 81 y.o. male with multiple medical including HTN, hyperlipidemia, atrial fibrillation off anticoagulation, CAD, s/p pacemaker placement, TIA's, AAA, DVT on fullk dose Lovenox, OSA, brought in via EMS for evaluation of sudden onset expressive aphasia. Wife is at the bedside. He was home today 12/09/3014, ate lunch, and subsequently developed sudden onset of inability to speak and right sided weakness at 1300 (LKW). EMS was summoned and patient brought to the ED where he was alert and awake with NIHSS 22. CT brain showed no acute abnormality. Patient administered his dose of Lovenox this morning. NIHSS: 22. Patient was not administered TPA secondary to no intravenous thrombolysis was given as patient received Lovenox few hours before presentation. Taken to the interventional suite for angiogram and had Complete revascularization of angular branch ,and partial revascularization of the frontal branch with 7.5 mg of superselective IATPA. Marland Kitchen He was admitted to the neuro ICU for further evaluation and treatment.   SUBJECTIVE (INTERVAL HISTORY) His wife and son are at the bedside.  He remains intubated this am. Patient drowsy this am. Off  hypertonic saline and last sodium is  above goal at 166   OBJECTIVE Pulse Rate:  [80-126] 98 (07/01 1135) Cardiac Rhythm:  [-] Ventricular paced;Atrial fibrillation (06/30 2030) Resp:  [25-34] 27 (07/01 1135) BP: (154-163)/(71-86) 156/74 mmHg (06/30 2356) SpO2:  [91 %-100 %] 97 % (07/01 1135) FiO2 (%):  [30 %] 30 % (07/01 0840)   Recent Labs Lab 12/13/14 0348 12/13/14 0748 12/13/14 1119 12/13/14 1636 12/13/14 2057  GLUCAP 224* 176* 221* 274* 252*    Recent Labs Lab 12/13/2014 1425 11/24/2014 1436 12/10/14 0610 12/11/14 0240  12/12/14 0411  12/12/14 1500 12/12/14 2140 12/13/14 0405 12/13/14 1123 12/13/14 1642  NA 138 137 139 140  < > 152*  < > 146* 163* 165* 167* 166*  K 4.4 4.3 4.0 4.1  --  3.8  --    --   --  3.1*  --   --   CL 100* 101 106 106  --  119*  --   --   --  >130*  --   --   CO2 27  --  26 26  --  25  --   --   --  26  --   --   GLUCOSE 244* 244* 145* 170*  --  259*  --   --   --  285*  --   --   BUN 26* 28* 19 14  --  22*  --   --   --  33*  --   --   CREATININE 1.23 1.10 0.90 0.88  --  0.87  --   --   --  0.93  --   --   CALCIUM 9.3  --  8.7* 8.4*  --  8.6*  --   --   --  8.7*  --   --   MG  --   --   --  1.9  --  2.4  --   --   --  2.7*  --   --   PHOS  --   --   --  3.0  --  2.2*  --   --   --  1.9*  --   --   < > = values in this interval not displayed.  Recent Labs Lab 12/12/2014 1425  AST 60*  ALT 69*  ALKPHOS 207*  BILITOT 0.9  PROT 6.7  ALBUMIN 3.5    Recent Labs Lab 11/25/2014 1425 12/12/2014 1436 12/10/14 0610 12/11/14 0240 12/12/14 0411 12/13/14 0405  WBC 8.0  --  9.6 13.4* 17.6* 16.1*  NEUTROABS 5.6  --  7.8*  --   --   --   HGB 12.6* 13.6 11.1* 11.3* 9.7* 9.8*  HCT 36.4* 40.0 32.8* 34.1* 30.0* 30.6*  MCV 95.5  --  96.2 98.0 100.0 101.7*  PLT 179  --  174 122* 130* 129*   No results for input(s): CKTOTAL, CKMB, CKMBINDEX, TROPONINI in the last 168 hours. No results for input(s): LABPROT, INR in the last 72 hours. No results for input(s): COLORURINE, LABSPEC, [place], GLUCOSEU, HGBUR, BILIRUBINUR, KETONESUR, PROTEINUR, UROBILINOGEN, NITRITE, LEUKOCYTESUR in the last 72 hours.  Invalid input(s): APPERANCEUR     Component Value Date/Time   CHOL 124 12/10/2014 0610   TRIG 156* 12/10/2014 0610   HDL 30* 12/10/2014 0610   CHOLHDL 4.1 12/10/2014 0610   VLDL 31 12/10/2014 0610   LDLCALC 63 12/10/2014 0610   Lab Results  Component Value Date   HGBA1C 7.8* 12/10/2014   No results found for: LABOPIA, COCAINSCRNUR, LABBENZ, AMPHETMU, THCU, LABBARB  No results for input(s): ETH in the last 168 hours.  Ct Head Wo Contrast  12/13/2014   CLINICAL DATA:  Narrow changes. History of headache, stroke, diabetes, hypertension.  EXAM: CT HEAD WITHOUT  CONTRAST  TECHNIQUE: Contiguous axial images were obtained from the base of the skull through the vertex without intravenous contrast.  COMPARISON:  CT head December 10, 2014  FINDINGS: Evolving 3.4 x 4.5 cm LEFT temporal parietal hematoma was 3.5 x 5 cm, small satellite hemorrhages, in a background of LEFT temporal parietal, LEFT occipital and LEFT frontal wedge-like loss of the gray-white matter differentiation and sulcal effacement. Faint probable petechial hemorrhage within LEFT frontal lobe infarct. 4 mm LEFT to RIGHT midline shift, previously 3 mm. Partially effaced LEFT lateral ventricle, no entrapment/ hydrocephalus. LEFT basal ganglia infarct. Basal cisterns are patent.  No abnormal extra-axial fluid collections. Mild calcific atherosclerosis of the carotid siphons. No skull fracture. Mild paranasal sinus mucosal thickening without air-fluid levels. The mastoid air cells are well aerated. Ocular globes and orbital contents are unremarkable.  IMPRESSION: Evolving LEFT temporal parietal intraparenchymal hematoma with small satellite hemorrhages, relatively stable.  Evolving LEFT frontal, LEFT temporal, LEFT parietal and LEFT occipital lobe infarcts spanning LEFT middle and LEFT posterior cerebral artery territories.  4 mm LEFT-to-RIGHT midline shift without ventricle entrapment.   Electronically Signed   By: Elon Alas M.D.   On: 12/13/2014 05:32   Dg Chest Port 1 View  12/13/2014   CLINICAL DATA:  Hypoxia  EXAM: PORTABLE CHEST - 1 VIEW  COMPARISON:  December 12, 2014  FINDINGS: Endotracheal tube tip is 5.9 cm above the carina. Nasogastric tube tip and side port are below the diaphragm. Central catheter tip is in the superior cava. Pacemaker leads are attached to the right atrium and right ventricle. No pneumothorax. Patchy airspace opacity in the lung bases is stable. Lungs elsewhere clear. There is mild cardiomegaly with small bilateral effusions. No appreciable adenopathy. There is atherosclerotic change  in the aorta.  IMPRESSION: Tube and catheter positions as described without pneumothorax. Patchy bibasilar edema versus pneumonia. Both entities may exist concurrently. Cardiomegaly with small effusions suggests that there is a degree of underlying congestive heart failure.   Electronically Signed   By: Lowella Grip III M.D.   On: 12/13/2014 08:05  Cerebral Angiogram 1occluded prominent angular branch of LT MCA andfrontal branch of superior division. Complete revascularization of angular branch ,and partial revascularization of the frontal branch with 7.5 mg of superselective IATPA  2D Echocardiogram   - Left ventricle: The cavity size was normal. Systolic function wasnormal. The estimated ejection fraction was in the range of 55%to 60%. Wall motion was normal; there were no regional wallmotion abnormalities. - Aortic valve: There was moderate stenosis. There was mildregurgitation. Valve area (VTI): 1.28 cm^2. Valve area (Vmax):1.19 cm^2. Valve area (Vmean): 1.24 cm^2. - Mitral valve: There was mild regurgitation. - Left atrium: The atrium was mildly dilated. - Right atrium: The atrium was moderately to severely dilated. - Tricuspid valve: There was moderate regurgitation. - Pulmonary arteries: PA peak pressure: 55 mm Hg (S).   PHYSICAL EXAM Elderly caucasian male not in distress. . Afebrile. Head is nontraumatic. Neck is supple without bruit.    Cardiac exam no murmur or gallop. Lungs are clear to auscultation. Distal pulses are well felt. He has right groin arterial sheath Neurological Exam : Patient is intubated. drowsy and  arousable. Pupils are 4 mm equal reactive. Fundi were not visualized. Left gaze preference.   Follows occasional midline commands.. Right lower facial weakness. Tongue is midline. Extraocular movements are full range without nystagmus or restriction. Right hemiparesis but unable to move her right hand and lower extremity at all.Trace RLE withdrawal to pain. Normal  antigravity movements on the left side. Diminished sensation on the right compared to the left. Right plantar upgoing left downgoing. Gait was not tested.    ASSESSMENT/PLAN [name] is a 81 y.o. male with history of CAD, DM, prior TIA, and seizure in March of 2016, found to have a DVT within 1 week and started on lovenox 100 mg BID presenting with sudden onset of aphasia. He did not receive IV t-PA due to being on full dose lovenox, but was taken to neurointervention for angio, s/p complete revascularization of L MCA angular branch and partial revascularization of the frontal branch with 7.5 mg of superselective IA TPA.  Stroke:  Left MCA and PCA territory infarcts s/p partial revascularization w/ neurointervenion now with L temporal parietal and satellite hematomas, infarct likely embolic secondary to unknown etiology. Hematoma secondary to IA, now symptomatic. tPA/intervention  Resultant  VDRF, R hemiparesis  Cerebral angio occluded prominent angular branch of LT MCA  CT  Post intervention 5 x 2.7 cm acute LEFT temporal parietal hematoma with small satellite hematomas.  Acute LEFT frontal, LEFT temporal, LEFT parietal and LEFT occipital lobe infarcts spanning LEFT middle cerebral and LEFT posterior cerebral artery territories.  Neuro worsening at 48 hours with CT showing increased cytotoxic cerebral edema, increased hemorrhage   3% saline now on hold forcerebral edema due to sodium 167  2D Echo  No source of embolus   LDL 63  HgbA1c 6.8 in March 2016  SCDs ordered for VTE prophylaxis Diet NPO time specified  full dose lovenox prior to admission for DVT, now on no antithrombotic given post intervention hemorrhage  Dr. Leonie Man discussed diagnosis, prognosis,  treatment options and plan of care with wife and son given neuro worsening and critical condition Continue ICU level care.    Therapy recommendations:  pending   Disposition:  pending   Respiratory Failure    Intubated for neuro intervention  CCM managing vent  Hold extubation given increased cerebral edema  DVT  Recent dx  On full dose lovenox  Filter placed 6/27 to  prevent PE  Essential Hypertension Diastolic HF  Home meds:   Diltiazem, cardura, lasix, metoprolol  Stable  Hyperlipidemia  Home meds:  lipitor 40   LDL 63, goal < 70  Resume statin none able to swallow and continue at discharge  Diabetes  HgbA1c 6.21 August 2014, goal < 7.0  Controlled  Other Stroke Risk Factors  Advanced age  Former Cigarette smoker, quit smoking years ago   ETOH use  Hx stroke/TIA - Hx TIA 1.5 yrs ago  Family hx stroke (father)  Coronary artery disease, MI  Obstructive sleep apnea  Other Active Problems  Mild renal insufficiency 0.9  Severe malnutrition  Hospital day # [place] for Pager information Dec 17, 2014 1:26 PM  I have personally examined this patient, reviewed notes, independently viewed imaging studies, participated in medical decision making and plan of care. I have made any additions or clarifications directly to the above note. Agree with note above. The patient's neurological exam has declined  with decreased mental status and increased right hemiparesis likely from increasing cytotoxic edema due to hemorrhagic left MCA infarct. I had a long discussion with the patient's son and wife at the bedside . They understand that the patient is likely need to have prolonged ventilatory support followed by possibly tracheostomy and PEG tube and nursing home placement and they feel patient has clearly expressed his desire in the past with them that he would not want any of this. They are now leaning towards comfort care but would like rest of the family to come and visit him before they make that decision . Anticipate terminal extubation later today. Discussed with Dr. Nelda Marseille This patient is critically ill and at significant risk  of neurological worsening, death and care requires constant monitoring of vital signs, hemodynamics,respiratory and cardiac monitoring, extensive review of multiple databases, frequent neurological assessment, discussion with family, other specialists and medical decision making of high complexity.I have made any additions or clarifications directly to the above note.This critical care time does not reflect procedure time, or teaching time or supervisory time of PA/NP/Med Resident etc but could involve care discussion time.  I spent 30 minutes of neurocritical care time  in the care of  this patient   Antony Contras, [place] Pager: [phone] Dec 17, 2014 1:26 PM   To contact Stroke Continuity provider, please refer to [email or web address] After hours, contact General Neurology Mindi used to going to classes to training?

## 2015-01-14 NOTE — Progress Notes (Signed)
PULMONARY  / CRITICAL CARE MEDICINE CONSULTATION   Name: Paul Dalton MRN: [id number] DOB: 05-Oct-1929    ADMISSION DATE:  12/03/2014 CONSULTATION DATE: December 25, 2014  REQUESTING CLINICIAN: Garvin Fila, MD PRIMARY SERVICE: Neurology  CHIEF COMPLAINT:  Sudden onset aphasia  BRIEF PATIENT DESCRIPTION: 81 y/o man with multiple risk factors for stroke on Lovenox for treatment of DVT who presented with left MCA stroke s/p revascularization and catheter directed TPA.  SIGNIFICANT EVENTS / STUDIES:  11/29/2014 - underwent intervention of acute L MCA stroke with 7.5 mg of superselective catheter-directed tPA.  LINES / TUBES: 8.0 ETT - 6/26 R fem Art line - 6/26 L fem arterial sheath - 6/26 Foley - 6/26  CULTURES: none  ANTIBIOTICS: Ancef x1 6/26  HISTORY OF PRESENT ILLNESS:  Paul Dalton is a 81 year old man with a history of CAD, DM, prior TIA, and seizure in March of 2016. He was found to have a DVT within a week of his presentation and was started on lovenox, 100 mg BID. He was brought to the ED on 6/26 after developing sudden onset aphasia and R sided weakness. He had a NIHSS of 22 and a head CT did not demonstrate any hemorrhage. Due to his anticoagulated state, tPA was not given, but he was taken by IR for catheter intervention where he had an angular branch of his L MCA occluded, as well as frontal branch of a superior division of the same. He had complete revascularization of the angular branch, and partial revascularization of the frontal branch. He was brought to the ICU and remain intubated. PCCM was consulted for critical care management.  SUBJECTIVE: Awake and interactive.  VITAL SIGNS: Temp:  [98.9 F (37.2 C)] 98.9 F (37.2 C) (06/30 1120) Pulse Rate:  [80-126] 126 (07/01 0800) Resp:  [24-34] 31 (07/01 0800) BP: (152-165)/(70-93) 156/74 mmHg (06/30 2356) SpO2:  [91 %-100 %] 91 % (07/01 0800) FiO2 (%):  [30 %] 30 % (07/01 0320) HEMODYNAMICS:   VENTILATOR  SETTINGS: Vent Mode:  [-] PRVC FiO2 (%):  [30 %] 30 % Set Rate:  [20 bmp] 20 bmp Vt Set:  [550 mL] 550 mL PEEP:  [5 cmH20] 5 cmH20 Pressure Support:  [10 cmH20] 10 cmH20 Plateau Pressure:  [14 cmH20-15 cmH20] 14 cmH20 INTAKE / OUTPUT: Intake/Output      06/30 0701 - 07/01 0700 07/01 0701 - 07/02 0700   I.V. (mL/kg) 316.4 (3.2) 24.7 (0.2)   NG/GT 880 40   Total Intake(mL/kg) 1196.4 (12) 64.7 (0.7)   Urine (mL/kg/hr) 1625 (0.7)    Stool 0 (0)    Total Output 1625     Net -428.7 +64.7        Stool Occurrence 2 x     PHYSICAL EXAMINATION: General:  Elderly man, intubated, unresponsive. Neuro:  Unarousable on propofol. HEENT:  [place]/AT, PERRL, EOM-I and MMM. Neck: No masses, -JVD. Cardiovascular:  RRR, Nl S1/S2, -M/R/G. Lungs:  CTA bilaterally Abdomen:  Soft, NT, ND and +BS. Musculoskeletal:  No deformities. Skin:  Scattered ecchymoses.  LABS:  CBC  Recent Labs Lab 12/11/14 0240 12/12/14 0411 12/13/14 0405  WBC 13.4* 17.6* 16.1*  HGB 11.3* 9.7* 9.8*  HCT 34.1* 30.0* 30.6*  PLT 122* 130* 129*   Coag's  Recent Labs Lab 11/24/2014 1425  APTT 38*  INR 1.29   BMET  Recent Labs Lab 12/11/14 0240  12/12/14 0411  12/13/14 0405 12/13/14 1123 12/13/14 1642  NA 140  < > 152*  < >  165* 167* 166*  K 4.1  --  3.8  --  3.1*  --   --   CL 106  --  119*  --  >130*  --   --   CO2 26  --  25  --  26  --   --   BUN 14  --  22*  --  33*  --   --   CREATININE 0.88  --  0.87  --  0.93  --   --   GLUCOSE 170*  --  259*  --  285*  --   --   < > = values in this interval not displayed. Electrolytes  Recent Labs Lab 12/11/14 0240 12/12/14 0411 12/13/14 0405  CALCIUM 8.4* 8.6* 8.7*  MG 1.9 2.4 2.7*  PHOS 3.0 2.2* 1.9*   Sepsis Markers No results for input(s): LATICACIDVEN, PROCALCITON, O2SATVEN in the last 168 hours.   ABG  Recent Labs Lab 12/11/14 0435 12/12/14 0448 12/13/14 0545  PHART 7.418 7.348* 7.313*  PCO2ART 39.0 44.5 50.6*  PO2ART 99.3 106* 71.5*    Liver Enzymes  Recent Labs Lab 12/03/2014 1425  AST 60*  ALT 69*  ALKPHOS 207*  BILITOT 0.9  ALBUMIN 3.5   Cardiac Enzymes No results for input(s): TROPONINI, PROBNP in the last 168 hours. Glucose  Recent Labs Lab 12/13/14 0030 12/13/14 0348 12/13/14 0748 12/13/14 1119 12/13/14 1636 12/13/14 2057  GLUCAP 293* 224* 176* 221* 274* 252*   Imaging Ct Head Wo Contrast  12/13/2014   CLINICAL DATA:  Narrow changes. History of headache, stroke, diabetes, hypertension.  EXAM: CT HEAD WITHOUT CONTRAST  TECHNIQUE: Contiguous axial images were obtained from the base of the skull through the vertex without intravenous contrast.  COMPARISON:  CT head December 10, 2014  FINDINGS: Evolving 3.4 x 4.5 cm LEFT temporal parietal hematoma was 3.5 x 5 cm, small satellite hemorrhages, in a background of LEFT temporal parietal, LEFT occipital and LEFT frontal wedge-like loss of the gray-white matter differentiation and sulcal effacement. Faint probable petechial hemorrhage within LEFT frontal lobe infarct. 4 mm LEFT to RIGHT midline shift, previously 3 mm. Partially effaced LEFT lateral ventricle, no entrapment/ hydrocephalus. LEFT basal ganglia infarct. Basal cisterns are patent.  No abnormal extra-axial fluid collections. Mild calcific atherosclerosis of the carotid siphons. No skull fracture. Mild paranasal sinus mucosal thickening without air-fluid levels. The mastoid air cells are well aerated. Ocular globes and orbital contents are unremarkable.  IMPRESSION: Evolving LEFT temporal parietal intraparenchymal hematoma with small satellite hemorrhages, relatively stable.  Evolving LEFT frontal, LEFT temporal, LEFT parietal and LEFT occipital lobe infarcts spanning LEFT middle and LEFT posterior cerebral artery territories.  4 mm LEFT-to-RIGHT midline shift without ventricle entrapment.   Electronically Signed   By: Elon Alas M.D.   On: 12/13/2014 05:32   Dg Chest Port 1 View  12/13/2014   CLINICAL  DATA:  Hypoxia  EXAM: PORTABLE CHEST - 1 VIEW  COMPARISON:  December 12, 2014  FINDINGS: Endotracheal tube tip is 5.9 cm above the carina. Nasogastric tube tip and side port are below the diaphragm. Central catheter tip is in the superior cava. Pacemaker leads are attached to the right atrium and right ventricle. No pneumothorax. Patchy airspace opacity in the lung bases is stable. Lungs elsewhere clear. There is mild cardiomegaly with small bilateral effusions. No appreciable adenopathy. There is atherosclerotic change in the aorta.  IMPRESSION: Tube and catheter positions as described without pneumothorax. Patchy bibasilar edema versus pneumonia. Both entities may  exist concurrently. Cardiomegaly with small effusions suggests that there is a degree of underlying congestive heart failure.   Electronically Signed   By: Lowella Grip III M.D.   On: 12/13/2014 08:05    EKG: V-paced rhythm. CXR: tube high  ASSESSMENT / PLAN:  PULMONARY A: Need for Mechanical Ventilation due to procedure Had DVTs diagnosed 2 wks ago P:   Terminal extubation today once morphine is started.  CARDIOVASCULAR A: Hypertension Diastolic HF P:   Turn off defibrillator. Full comfort.   RENAL A: Mild renal insuffiency,  P:   D/C further blood draws.  GASTROINTESTINAL A: No active issues. P:   D/C TF.  HEMATOLOGIC A: Recent DVT P:   D/C further blood draws.  INFECTIOUS A: No active issues. P:   D/C abx.  ENDOCRINE A: DM 2 P:   D/C CBGs.  NEUROLOGIC A: Acute L MCA stroke P:   Morphine drip for comfort.  Spoke with wife at length, full withdrawal of care today.  The patient is critically ill with multiple organ systems failure and requires high complexity decision making for assessment and support, frequent evaluation and titration of therapies, application of advanced monitoring technologies and extensive interpretation of multiple databases.   Critical Care Time devoted to patient care  services described in this note is  35  Minutes. This time reflects time of care of this signee Dr Jennet Maduro. This critical care time does not reflect procedure time, or teaching time or supervisory time of PA/NP/Med student/Med Resident etc but could involve care discussion time.  Rush Farmer, M.D. [facility] Pulmonary/Critical Care Medicine. Pager: [phone]. After hours pager: [phone].

## 2015-01-14 NOTE — Progress Notes (Signed)
Patient expired.  No heart sounds or lung sounds auscultated, verified by 2 RNs.  Family at bedside and notified.  MD notified.

## 2015-01-14 NOTE — Progress Notes (Signed)
Nutrition Brief Note  Chart reviewed. Pt now transitioning to comfort care.  No further nutrition interventions warranted at this time.  Please re-consult as needed.   Seher Schlagel RD, LDN, CNSC [phone] Pager [phone] After Hours Pager    

## 2015-01-14 NOTE — Progress Notes (Signed)
Patient extubated with the withdrawal of life sustaining protocol. No complications noted. RN at bedside.

## 2015-01-14 DEATH — deceased

## 2015-02-28 ENCOUNTER — Ambulatory Visit: Payer: Medicare Other | Admitting: Neurology

## 2016-08-21 IMAGING — XA IR PERCUTANEOUS ART THORMBECTOMY/INFUSION INTRACRANIAL INCLUDE D
1 series · 11 of 24 positions shown · IV contrast (IODINE)
Comparison: none

CLINICAL DATA: Right-sided hemiplegia.  Global aphasia.

[Series 300: neuro · 11 of 182 slices shown]
[im 8/182]
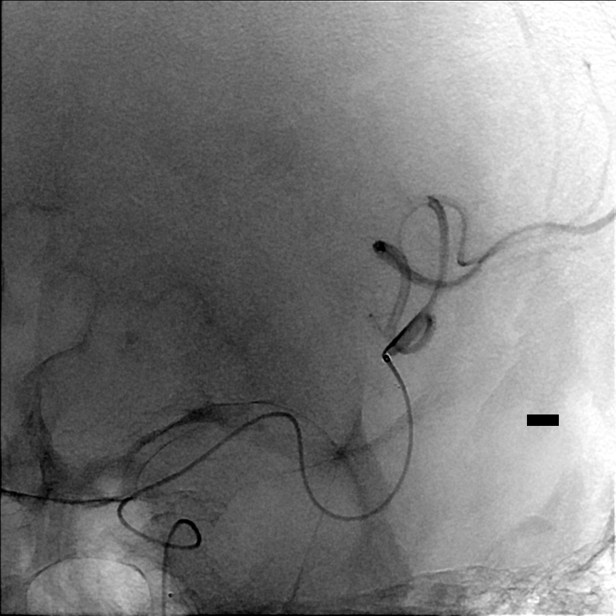
[im 24/182]
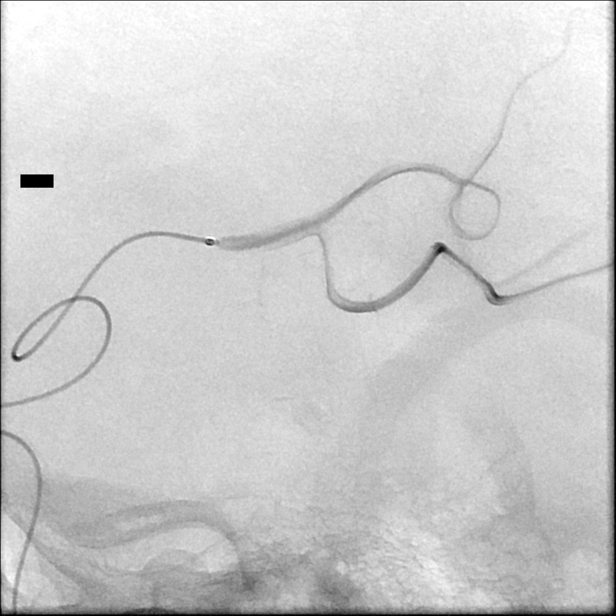
[im 40/182]
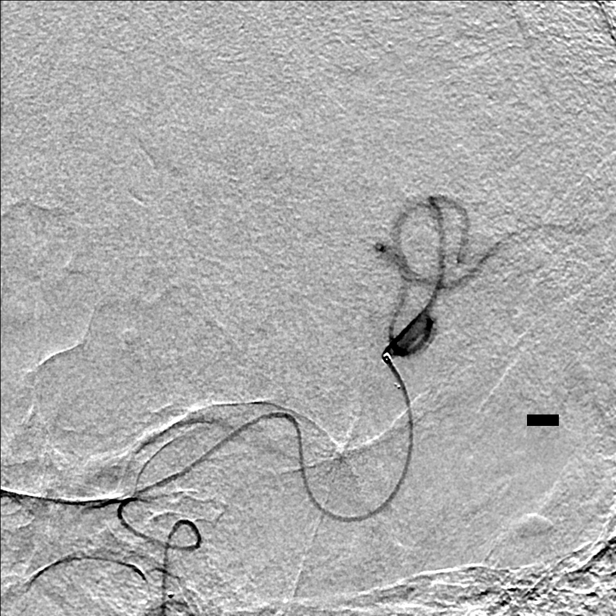
[im 56/182]
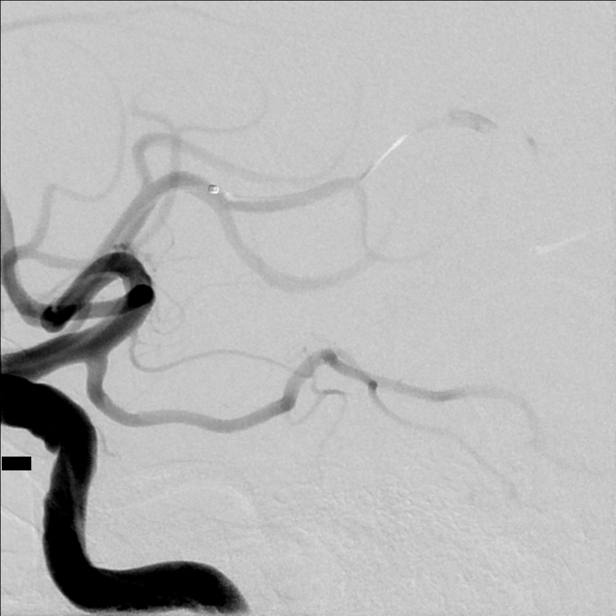
[im 71/182]
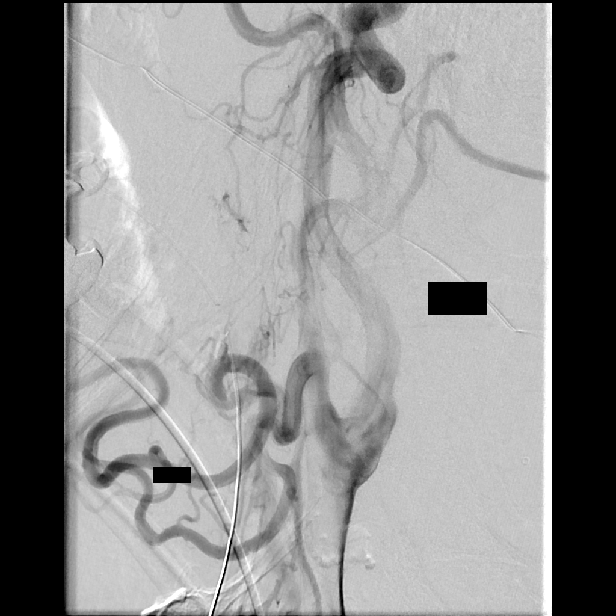
[im 95/182]
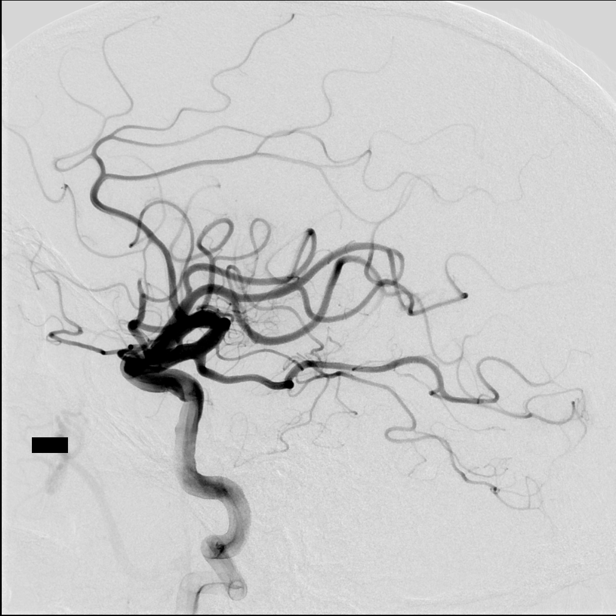
[im 111/182]
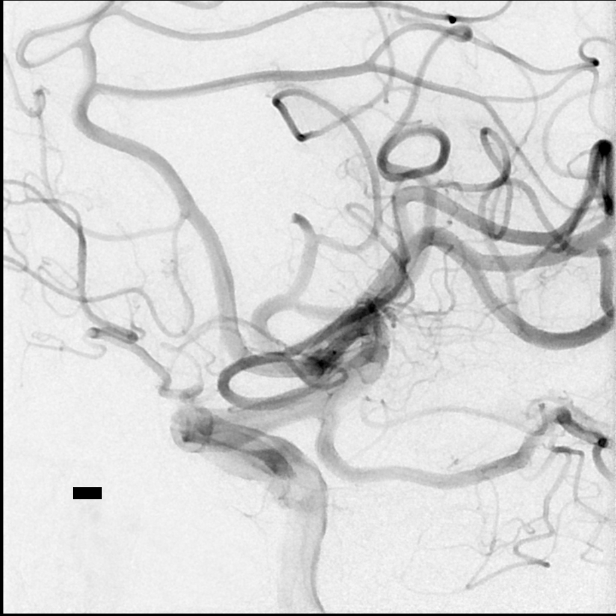
[im 126/182]
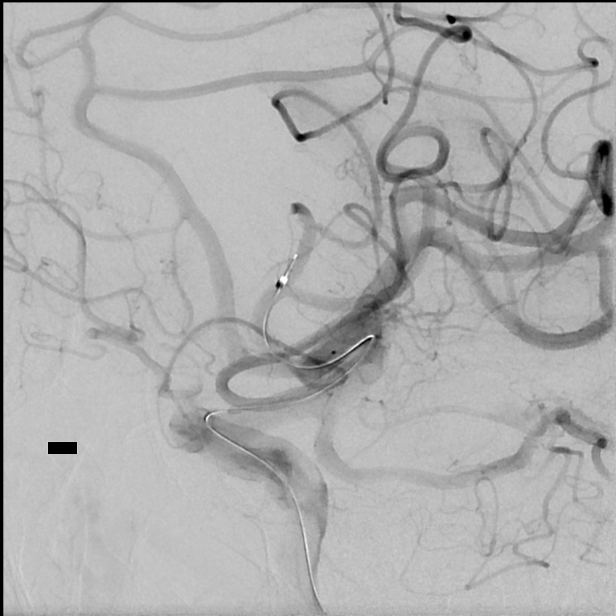
[im 142/182]
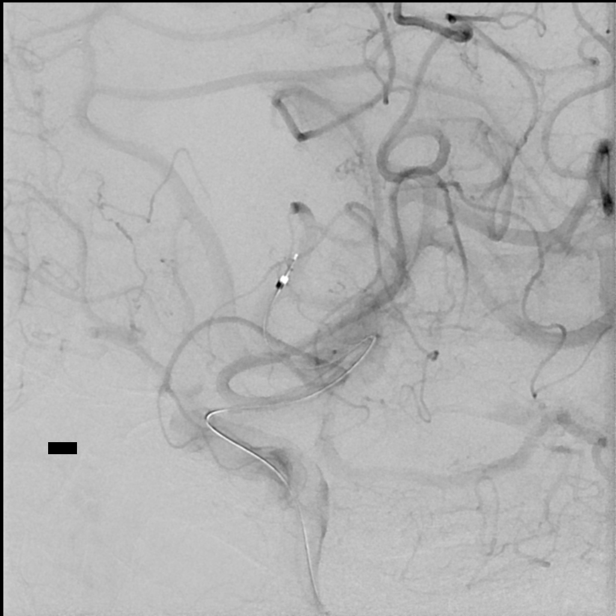
[im 158/182]
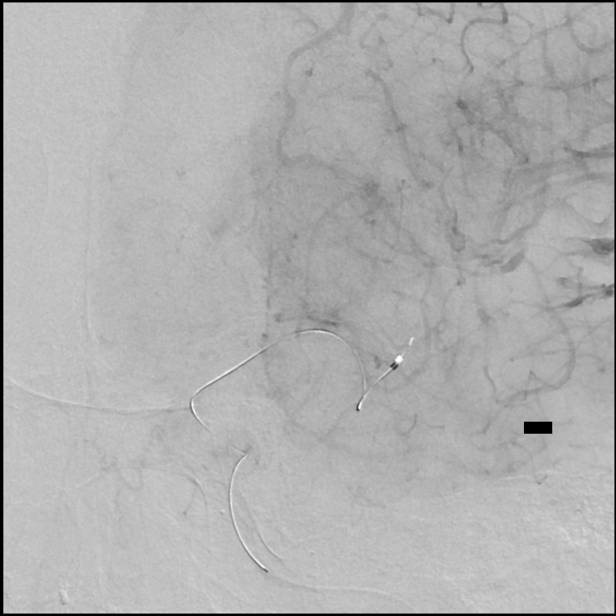
[im 174/182]
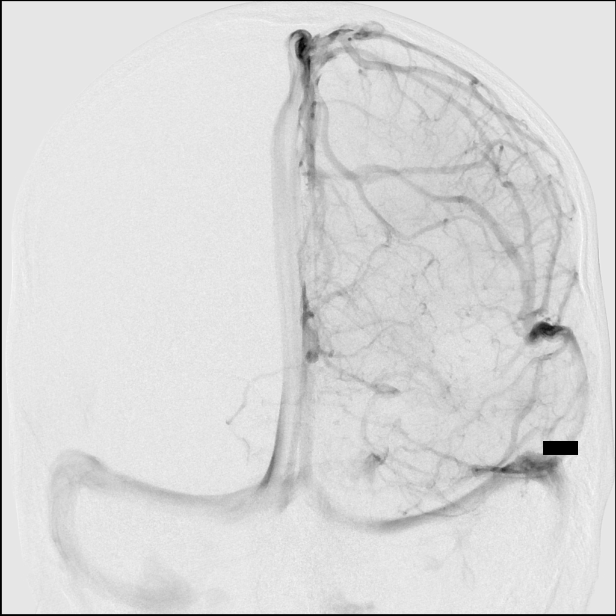

[11 of 24 positions shown; findings below may reference images not displayed]

EXAM:
IR PERCUTANEOUS ART THORMBECTOMY/INFUSION INTRACRANIAL INCLUDE DIAG
ANGIO LEFT COMMON CAROTID ARTERIOGRAM FOLLOWED BY ENDOVASCULAR
REVASCULARIZATION OF OCCLUDED ANGULAR BRANCH OF THE LEFT MIDDLE
CEREBRAL ARTERY, AND PARTIALLY THE FRONTAL BRANCH OF THE SUPERIOR
DIVISION OF THE LEFT MIDDLE CEREBRAL ARTERY USING SUPERSELECTIVE
INTRACRANIAL INTRA-ARTERIAL TPA.

PROCEDURE:
Contrast: 50 mL.

Anesthesia/Sedation:  As per general anesthesia.

Medications: As per general anesthesia.

Following a full explanation of the procedure along with the
potential associated complications, an informed witnessed consent
was obtained. Risks of intracranial hemorrhage of 10%, worsening
neurological function, ventilator dependency, death and inability to
re-vascularize were all reviewed in detail with the patient's
family. After informed consent, the patient was put under general
anesthesia by the [REDACTED] at [REDACTED].

The right groin was prepped and draped in the usual sterile fashion.
Thereafter using a modified Seldinger technique, transfemoral access
into the right common femoral artery was obtained without
difficulty. Over a 0.035 inch guidewire a 5 French Pinnacle sheath
was inserted. Through this, and also over a 0.035 inch guidewire a 5
French JB1 catheter was advanced to the aortic arch region and
selectively positioned in the left common carotid artery.
Arteriograms were obtained centered over the carotid bifurcation and
intracranially.

There were no acute complications. The patient tolerated the
procedure well.
FINDINGS: The left common carotid arteriogram demonstrates the left external
carotid artery and its major branches to be normal.

The left internal carotid artery at the bulb to its proximal
one-third is widely patent.

There is moderate to moderately severe tortuosity of the distal
cervical left ICA. More distally the vessel is seen to opacify to
the cranial skull base. The petrous, cavernous and the supraclinoid
segments are widely patent.

The left middle and the left anterior cerebral arteries opacify into
the capillary and venous phases.

A left posterior communicating artery is seen opacifying the left
posterior cerebral artery distribution.

There is complete occlusion of the angular division of the inferior
division of the left middle cerebral artery. Also noted is occlusion
of the anterior frontal branch of the superior division of the left
middle cerebral and the anterior perisylvian triangle.

ENDOVASCULAR REVASCULARIZATION OF OCCLUDED LEFT MIDDLE CEREBRAL
ARTERY BRANCHES, THE ANGULAR BRANCH AND THE ANTERIOR FRONTAL BRANCH
USING SUPERSELECTIVE INTRACRANIAL INTRA-ARTERIAL IA TPA.

The diagnostic JB1 catheter in the left common carotid artery was
exchanged over a 0.035 inch 300 cm Rosen exchange guidewire for a 55
cm 8 French Brite tip neurovascular sheath. This was then connected
to continuous heparinized saline infusion.

Over the 0.035 inch guidewire, an 8 French 85 cm FlowGate balloon
guide catheter which had been prepped and purged with 50% percent
contrast and 50% heparinized saline, was advanced and positioned
just proximal to the left common carotid artery bifurcation. The
guidewire was removed. Good aspiration was obtained from the hub of
the 8 French FlowGate guide catheter.

Using biplane roadmap technique and constant fluoroscopic guidance,
over a 0.035 inch Roadrunner guidewire, the 8 French FlowGate guide
catheter was then advanced to the mid cervical left ICA. The
guidewire was removed. Good aspiration was obtained from the hub of
the 8 French FlowGate guide catheter. A gentle contrast injection
demonstrated no evidence of spasms, dissections or of intraluminal
filling defects.

Using biplane roadmap technique and constant fluoroscopic guidance,
in a coaxial manner and with constant heparinized saline infusion, a
Via 021 micro catheter was advanced over a 0.014 inch Softip Synchro
micro guidewire to the distal end of the FlowGate guide catheter.

With the micro guidewire leading with a J-tip configuration to avoid
dissections or inducing spasm, the combination was advanced to the
left middle cerebral artery M1 segment. The micro guidewire was then
gently manipulated with a torque device to advance the micro
catheter tip into the angular branch at the site of the thrombus.
Approximately 4 mg of superselective intracranial intra-arterial tPA
was then infused over over about 4 minutes and 4 mL of normal
saline.

At the end of this, a control arteriogram performed with the micro
catheter retrieved slightly more proximally revealed complete
revascularization of the occluded angular branch.

The anterior frontal branch of the superior division of the left
middle cerebral artery was then superselected again using a micro
guidewire and roadmap technique and constant fluoroscopic guidance.
The micro catheter was advanced into the clot. Having confirmed safe
location of the tip of the micro catheter, approximately 3.5 mg of
superselective intracranial intra-arterial tPA IA was infused over
approximately 4 minutes in 4 cc of normal saline. At the end of this
there was partial distal recanalization. The micro catheter was then
retrieved and withdrawn. A final control arteriogram performed
through the 8 French FlowGate guide catheter in the left internal
carotid artery revealed continued complete angiographic
revascularization of the angular branch, and partially of the
anterior frontal branch as described above.

Throughout the procedure, there were no acute complications such as
intraluminal filling defects, dissections or of extravasations.

The patient's hemodynamic status remained stable as well.

The 8 French FlowGate guide catheter and the 8 French neurovascular
sheath were then retrieved into the abdominal aorta and exchanged
over a J-tip guidewire for a 9 French Pinnacle sheath.

This was then connected to continuous heparinized saline infusion.
The patient was then transported to the CT scanner for
postprocedural CT scan of brain.
IMPRESSION: Status post endovascular complete revascularization of occluded
angular branch of the left middle cerebral artery with 4 mg of IA
tPA superselectively and partially of the anterior frontal branch
using 3.5 mg of intra-arterial tPA superselectively with partial
revascularization as described above.

## 2016-08-21 IMAGING — CT CT HEAD W/O CM
2 series · 16 of 30 positions shown, 18 images · non-contrast
Comparison: 08/23/2014; 05/26/2013

CLINICAL DATA: Code stroke.  Right-sided weakness.

EXAM:
CT HEAD WITHOUT CONTRAST
TECHNIQUE: Contiguous axial images were obtained from the base of the skull
through the vertex without intravenous contrast.

[Series 201: head w/o, idose (1) · axial · non-contrast · 0.49mm/px · z∈[+89,+219]mm · 8 of 34 slices shown, 10 images]
[im 4/34  brain]
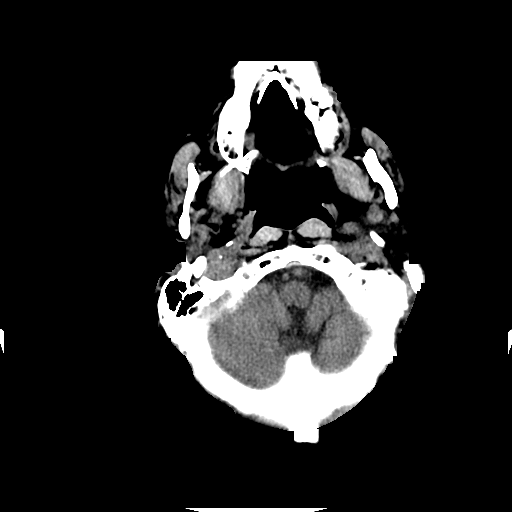
[im 4/34  bone]
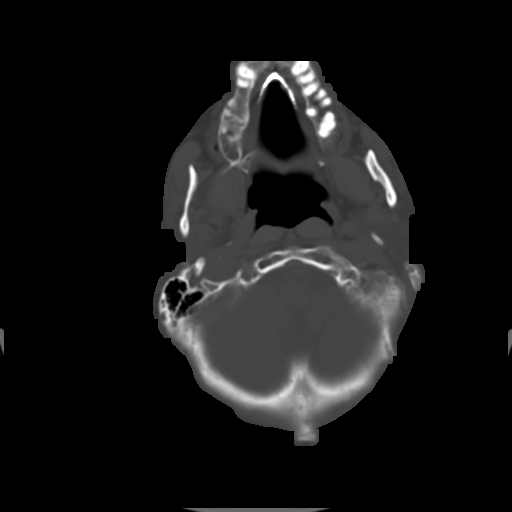
[im 8/34  brain]
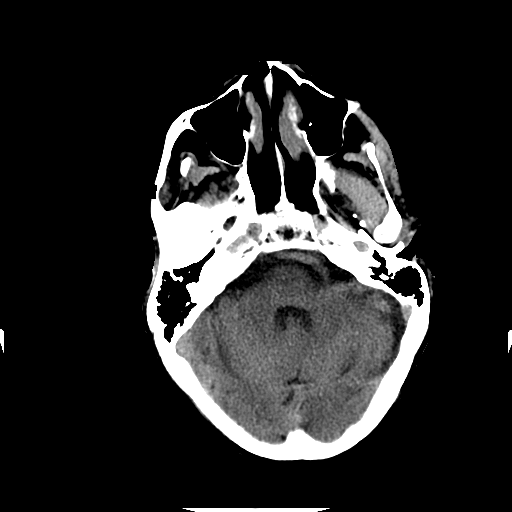
[im 12/34  brain]
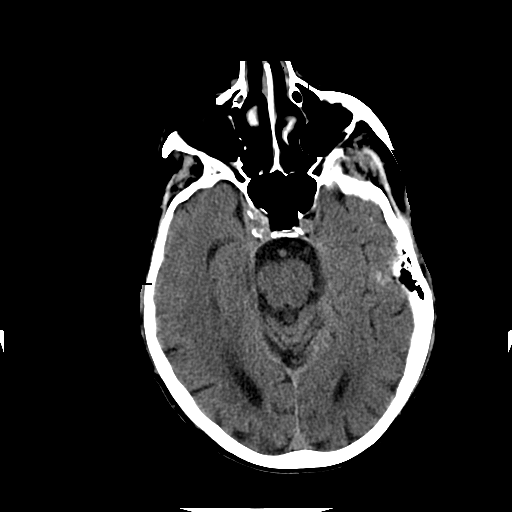
[im 15/34  brain]
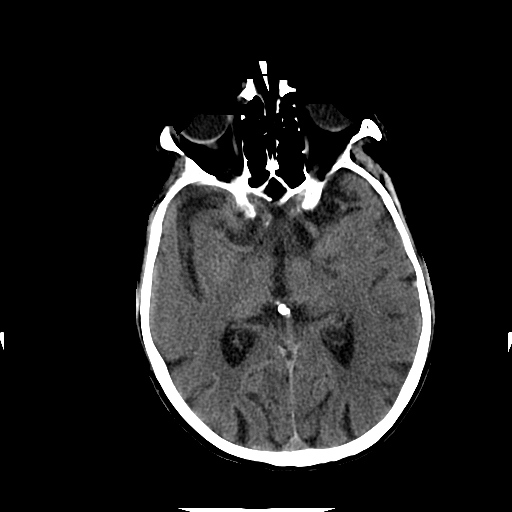
[im 19/34  brain]
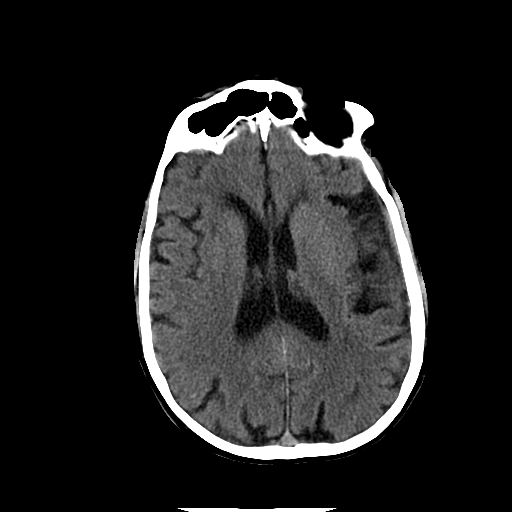
[im 19/34  bone]
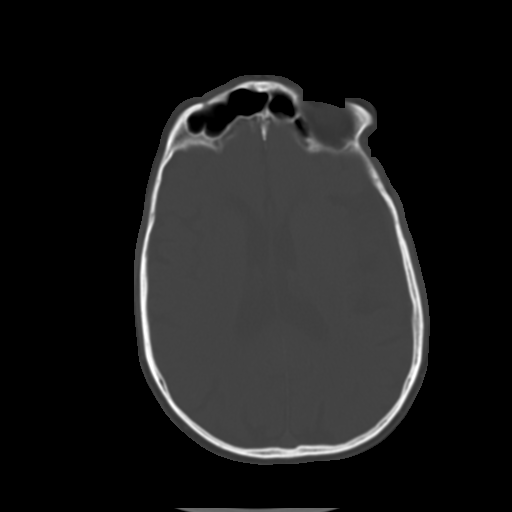
[im 23/34  brain]
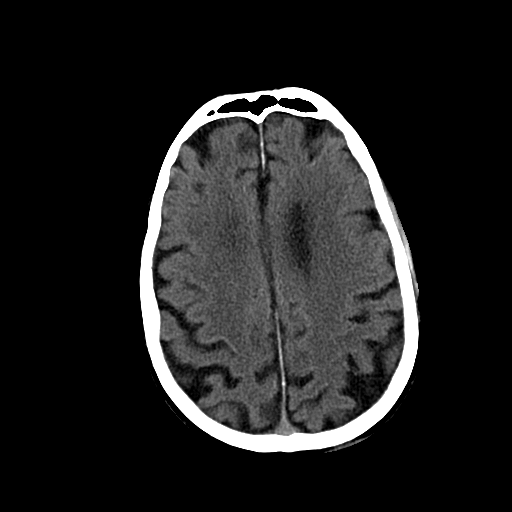
[im 26/34  brain]
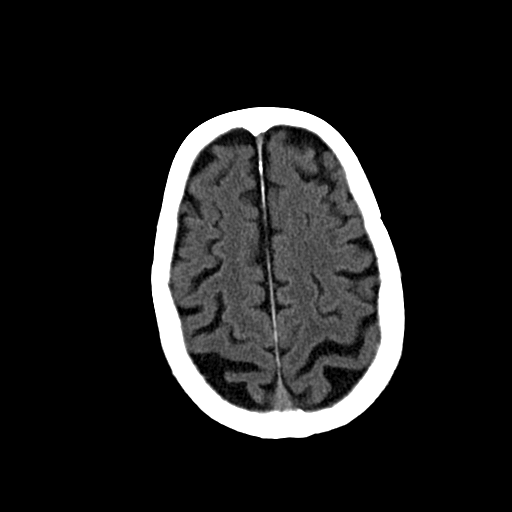
[im 30/34  brain]
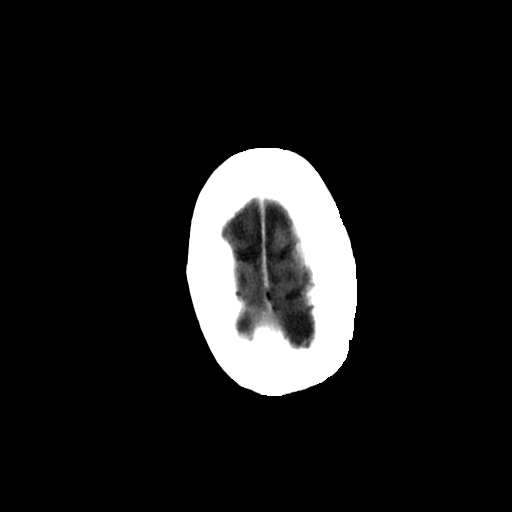

[Series 202: head w/o bone, idose (1) · axial · non-contrast · 0.49mm/px · z∈[+90,+220]mm · 8 of 68 slices shown]
[im 8/68  bone]
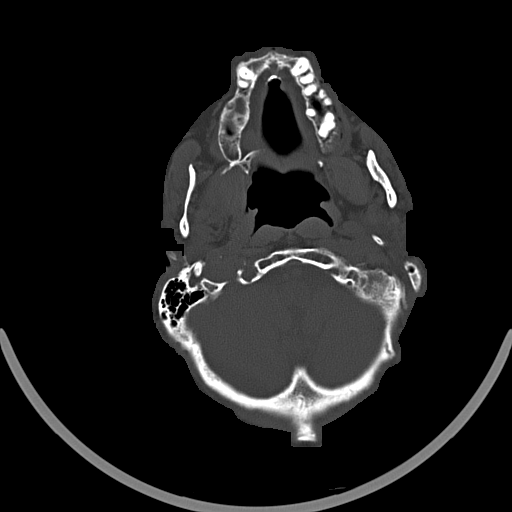
[im 15/68  bone]
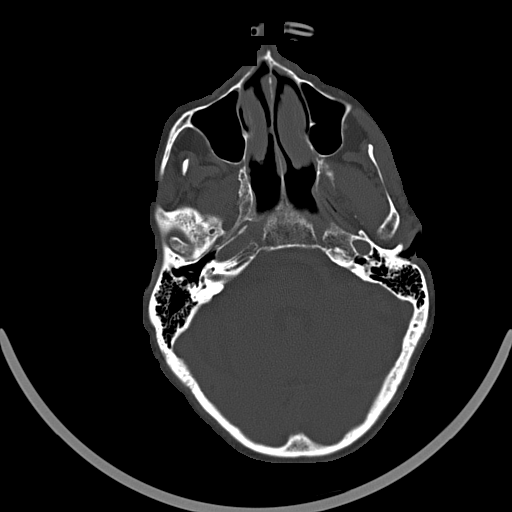
[im 22/68  bone]
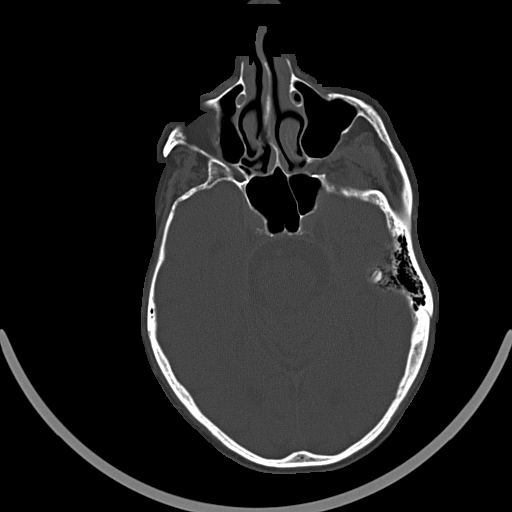
[im 29/68  bone]
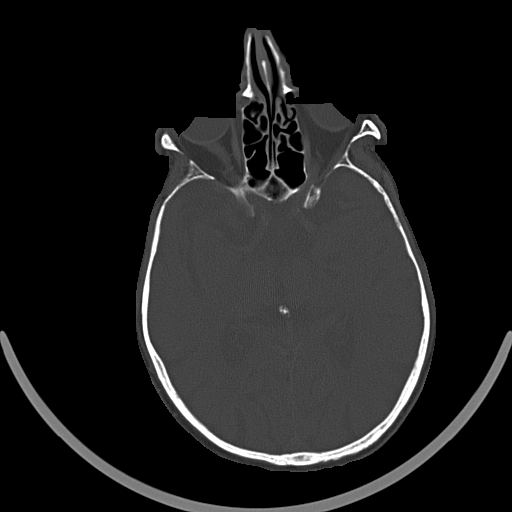
[im 39/68  bone]
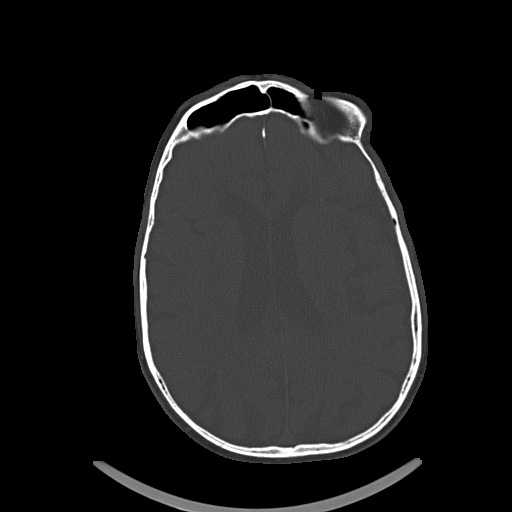
[im 46/68  bone]
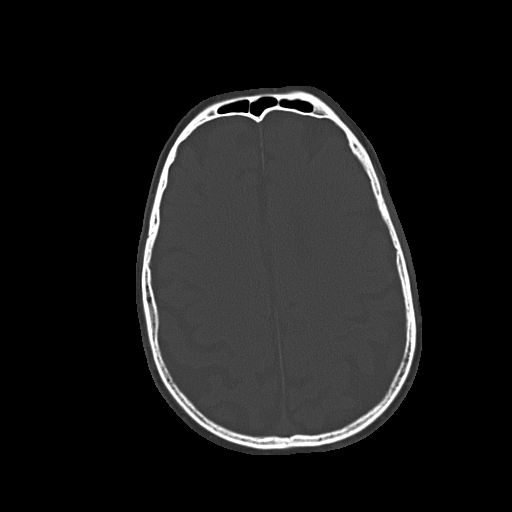
[im 53/68  bone]
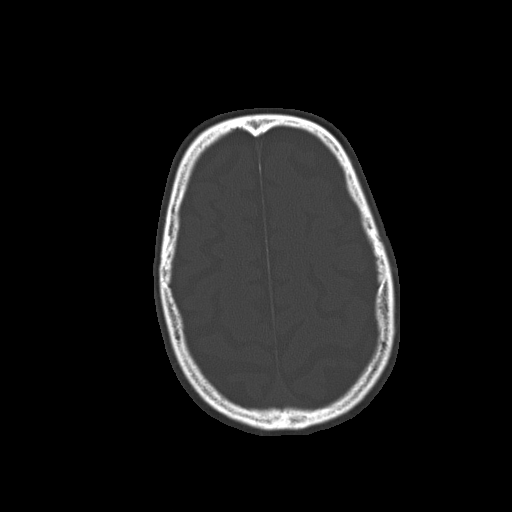
[im 60/68  bone]
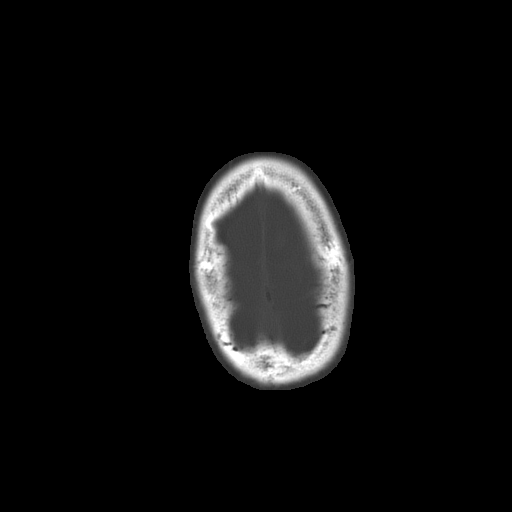

[16 of 30 positions shown; findings below may reference images not displayed]

FINDINGS: Similar findings of advanced atrophy with diffuse sulcal prominence.
Scattered periventricular hypodensities compatible microvascular
ischemic disease. No CT evidence of acute large territory infarct.
No intraparenchymal or extra-axial mass or hemorrhage. Unchanged
size and configuration of the ventricles and basilar cisterns. No
midline shift. Intracranial atherosclerosis. Limited visualization
the paranasal sinuses and mastoid air cells is normal. No air-fluid
levels. Regional soft tissues appear normal. Post bilateral cataract
surgery. No displaced calvarial fracture.
IMPRESSION: Similar findings of atrophy and microvascular ischemic disease
without acute intracranial process.

Critical Value/emergent results were called by telephone at the time
of interpretation on 12/09/2014 at [DATE] to Dr. Olexjoe, who
verbally acknowledged these results.
